# Patient Record
Sex: Female | Born: 1940 | Race: White | Hispanic: No | Smoking: Never smoker
Health system: Southern US, Community
[De-identification: ages and names within clinical notes are randomized; demographics above are authoritative.]

## PROBLEM LIST (undated history)

## (undated) DIAGNOSIS — E559 Vitamin D deficiency, unspecified: Secondary | ICD-10-CM

## (undated) DIAGNOSIS — R7309 Other abnormal glucose: Secondary | ICD-10-CM

## (undated) DIAGNOSIS — S62101A Fracture of unspecified carpal bone, right wrist, initial encounter for closed fracture: Secondary | ICD-10-CM

## (undated) DIAGNOSIS — E785 Hyperlipidemia, unspecified: Secondary | ICD-10-CM

## (undated) DIAGNOSIS — I10 Essential (primary) hypertension: Secondary | ICD-10-CM

## (undated) HISTORY — DX: Fracture of unspecified carpal bone, right wrist, initial encounter for closed fracture: S62.101A

## (undated) HISTORY — DX: Essential (primary) hypertension: I10

## (undated) HISTORY — DX: Vitamin D deficiency, unspecified: E55.9

## (undated) HISTORY — DX: Other abnormal glucose: R73.09

## (undated) HISTORY — DX: Hyperlipidemia, unspecified: E78.5

---

## 1997-01-15 LAB — HM COLONOSCOPY

## 1997-11-16 ENCOUNTER — Encounter: Payer: Self-pay | Admitting: Family Medicine

## 1997-11-16 ENCOUNTER — Ambulatory Visit (HOSPITAL_COMMUNITY): Admission: RE | Admit: 1997-11-16 | Discharge: 1997-11-16 | Payer: Self-pay | Admitting: Family Medicine

## 1998-11-28 ENCOUNTER — Other Ambulatory Visit: Admission: RE | Admit: 1998-11-28 | Discharge: 1998-11-28 | Payer: Self-pay | Admitting: Gynecology

## 1999-02-17 ENCOUNTER — Other Ambulatory Visit: Admission: RE | Admit: 1999-02-17 | Discharge: 1999-02-17 | Payer: Self-pay | Admitting: Gynecology

## 1999-04-26 ENCOUNTER — Ambulatory Visit (HOSPITAL_COMMUNITY): Admission: RE | Admit: 1999-04-26 | Discharge: 1999-04-26 | Payer: Self-pay | Admitting: Gynecology

## 1999-04-26 ENCOUNTER — Encounter: Payer: Self-pay | Admitting: Gynecology

## 1999-12-04 ENCOUNTER — Other Ambulatory Visit: Admission: RE | Admit: 1999-12-04 | Discharge: 1999-12-04 | Payer: Self-pay | Admitting: Gynecology

## 2001-01-16 ENCOUNTER — Ambulatory Visit (HOSPITAL_COMMUNITY): Admission: RE | Admit: 2001-01-16 | Discharge: 2001-01-16 | Payer: Self-pay | Admitting: Family Medicine

## 2001-01-16 ENCOUNTER — Encounter: Payer: Self-pay | Admitting: Family Medicine

## 2001-01-16 ENCOUNTER — Other Ambulatory Visit: Admission: RE | Admit: 2001-01-16 | Discharge: 2001-01-16 | Payer: Self-pay | Admitting: Gynecology

## 2003-01-01 ENCOUNTER — Other Ambulatory Visit: Admission: RE | Admit: 2003-01-01 | Discharge: 2003-01-01 | Payer: Self-pay | Admitting: Internal Medicine

## 2003-01-12 ENCOUNTER — Ambulatory Visit (HOSPITAL_COMMUNITY): Admission: RE | Admit: 2003-01-12 | Discharge: 2003-01-12 | Payer: Self-pay | Admitting: Internal Medicine

## 2004-01-13 ENCOUNTER — Ambulatory Visit (HOSPITAL_COMMUNITY): Admission: RE | Admit: 2004-01-13 | Discharge: 2004-01-13 | Payer: Self-pay | Admitting: Internal Medicine

## 2005-02-05 ENCOUNTER — Ambulatory Visit (HOSPITAL_COMMUNITY): Admission: RE | Admit: 2005-02-05 | Discharge: 2005-02-05 | Payer: Self-pay | Admitting: Internal Medicine

## 2006-02-07 ENCOUNTER — Ambulatory Visit (HOSPITAL_COMMUNITY): Admission: RE | Admit: 2006-02-07 | Discharge: 2006-02-07 | Payer: Self-pay | Admitting: Internal Medicine

## 2007-08-15 ENCOUNTER — Ambulatory Visit (HOSPITAL_COMMUNITY): Admission: RE | Admit: 2007-08-15 | Discharge: 2007-08-15 | Payer: Self-pay | Admitting: Internal Medicine

## 2008-09-03 ENCOUNTER — Ambulatory Visit (HOSPITAL_COMMUNITY): Admission: RE | Admit: 2008-09-03 | Discharge: 2008-09-03 | Payer: Self-pay | Admitting: Internal Medicine

## 2008-09-03 LAB — HM DEXA SCAN

## 2010-01-15 HISTORY — PX: EYE SURGERY: SHX253

## 2012-05-22 ENCOUNTER — Other Ambulatory Visit (HOSPITAL_COMMUNITY)
Admission: RE | Admit: 2012-05-22 | Discharge: 2012-05-22 | Disposition: A | Discharge status: Home / Self Care | Payer: Medicare Other | Source: Ambulatory Visit | Attending: Unknown Physician Specialty | Admitting: Unknown Physician Specialty

## 2012-05-22 DIAGNOSIS — Z124 Encounter for screening for malignant neoplasm of cervix: Secondary | ICD-10-CM | POA: Insufficient documentation

## 2012-06-19 ENCOUNTER — Other Ambulatory Visit (HOSPITAL_COMMUNITY): Payer: Self-pay | Admitting: Internal Medicine

## 2012-06-19 ENCOUNTER — Other Ambulatory Visit (HOSPITAL_COMMUNITY): Payer: Self-pay | Admitting: Emergency Medicine

## 2012-06-19 DIAGNOSIS — Z1231 Encounter for screening mammogram for malignant neoplasm of breast: Secondary | ICD-10-CM

## 2012-06-24 ENCOUNTER — Ambulatory Visit (HOSPITAL_COMMUNITY)
Admission: RE | Admit: 2012-06-24 | Discharge: 2012-06-24 | Disposition: A | Discharge status: Home / Self Care | Payer: Medicare Other | Source: Ambulatory Visit | Attending: Emergency Medicine | Admitting: Emergency Medicine

## 2012-06-24 DIAGNOSIS — Z1231 Encounter for screening mammogram for malignant neoplasm of breast: Secondary | ICD-10-CM

## 2012-11-05 ENCOUNTER — Encounter: Payer: Self-pay | Admitting: Internal Medicine

## 2012-11-20 ENCOUNTER — Ambulatory Visit: Payer: Self-pay | Admitting: Emergency Medicine

## 2012-11-20 ENCOUNTER — Encounter: Payer: Self-pay | Admitting: Emergency Medicine

## 2012-11-20 VITALS — BP 138/62 | HR 60 | Temp 98.2°F | Resp 16 | Ht 63.5 in | Wt 134.0 lb

## 2012-11-20 DIAGNOSIS — I10 Essential (primary) hypertension: Secondary | ICD-10-CM

## 2012-11-20 DIAGNOSIS — E782 Mixed hyperlipidemia: Secondary | ICD-10-CM | POA: Insufficient documentation

## 2012-11-20 DIAGNOSIS — E559 Vitamin D deficiency, unspecified: Secondary | ICD-10-CM

## 2012-11-20 DIAGNOSIS — R7309 Other abnormal glucose: Secondary | ICD-10-CM | POA: Insufficient documentation

## 2012-11-20 LAB — HEPATIC FUNCTION PANEL
Alkaline Phosphatase: 84 U/L (ref 39–117)
Bilirubin, Direct: 0.1 mg/dL (ref 0.0–0.3)
Indirect Bilirubin: 0.3 mg/dL (ref 0.0–0.9)

## 2012-11-20 LAB — BASIC METABOLIC PANEL WITH GFR
CO2: 26 mEq/L (ref 19–32)
Chloride: 106 mEq/L (ref 96–112)
GFR, Est African American: 49 mL/min — ABNORMAL LOW
Glucose, Bld: 90 mg/dL (ref 70–99)
Potassium: 5.6 mEq/L — ABNORMAL HIGH (ref 3.5–5.3)

## 2012-11-20 LAB — CBC WITH DIFFERENTIAL/PLATELET
Basophils Relative: 0 % (ref 0–1)
Eosinophils Absolute: 0.1 10*3/uL (ref 0.0–0.7)
MCH: 29.6 pg (ref 26.0–34.0)
MCHC: 34 g/dL (ref 30.0–36.0)
MCV: 86.9 fL (ref 78.0–100.0)
Neutro Abs: 2.9 10*3/uL (ref 1.7–7.7)
Neutrophils Relative %: 57 % (ref 43–77)
Platelets: 210 10*3/uL (ref 150–400)
WBC: 5.2 10*3/uL (ref 4.0–10.5)

## 2012-11-20 LAB — LIPID PANEL
Cholesterol: 170 mg/dL (ref 0–200)
Triglycerides: 73 mg/dL (ref <150)
VLDL: 15 mg/dL (ref 0–40)

## 2012-11-20 NOTE — Patient Instructions (Signed)
PREDiabetes Meal Planning Guide The diabetes meal planning guide is a tool to help you plan your meals and snacks. It is important for people with diabetes to manage their blood glucose (sugar) levels. Choosing the right foods and the right amounts throughout your day will help control your blood glucose. Eating right can even help you improve your blood pressure and reach or maintain a healthy weight. CARBOHYDRATE COUNTING MADE EASY When you eat carbohydrates, they turn to sugar. This raises your blood glucose level. Counting carbohydrates can help you control this level so you feel better. When you plan your meals by counting carbohydrates, you can have more flexibility in what you eat and balance your medicine with your food intake. Carbohydrate counting simply means adding up the total amount of carbohydrate grams in your meals and snacks. Try to eat about the same amount at each meal. Foods with carbohydrates are listed below. Each portion below is 1 carbohydrate serving or 15 grams of carbohydrates. Ask your dietician how many grams of carbohydrates you should eat at each meal or snack. Grains and Starches  1 slice bread.   English muffin or hotdog/hamburger bun.   cup cold cereal (unsweetened).   cup cooked pasta or rice.   cup starchy vegetables (corn, potatoes, peas, beans, winter squash).  1 tortilla (6 inches).   bagel.  1 waffle or pancake (size of a CD).   cup cooked cereal.  4 to 6 small crackers. *Whole grain is recommended. Fruit  1 cup fresh unsweetened berries, melon, papaya, pineapple.  1 small fresh fruit.   banana or mango.   cup fruit juice (4 oz unsweetened).   cup canned fruit in natural juice or water.  2 tbs dried fruit.  12 to 15 grapes or cherries. Milk and Yogurt  1 cup fat-free or 1% milk.  1 cup soy milk.  6 oz light yogurt with sugar-free sweetener.  6 oz low-fat soy yogurt.  6 oz plain yogurt. Vegetables  1 cup raw or   cup cooked is counted as 0 carbohydrates or a "free" food.  If you eat 3 or more servings at 1 meal, count them as 1 carbohydrate serving. Other Carbohydrates   oz chips or pretzels.   cup ice cream or frozen yogurt.   cup sherbet or sorbet.  2 inch square cake, no frosting.  1 tbs honey, sugar, jam, jelly, or syrup.  2 small cookies.  3 squares of graham crackers.  3 cups popcorn.  6 crackers.  1 cup broth-based soup.  Count 1 cup casserole or other mixed foods as 2 carbohydrate servings.  Foods with less than 20 calories in a serving may be counted as 0 carbohydrates or a "free" food. You may want to purchase a book or computer software that lists the carbohydrate gram counts of different foods. In addition, the nutrition facts panel on the labels of the foods you eat are a good source of this information. The label will tell you how big the serving size is and the total number of carbohydrate grams you will be eating per serving. Divide this number by 15 to obtain the number of carbohydrate servings in a portion. Remember, 1 carbohydrate serving equals 15 grams of carbohydrate. SERVING SIZES Measuring foods and serving sizes helps you make sure you are getting the right amount of food. The list below tells how big or small some common serving sizes are.  1 oz.........4 stacked dice.  3 oz........Marland KitchenDeck of cards.  1 tsp.......Marland KitchenTip  of little finger.  1 tbs......Marland KitchenMarland KitchenThumb.  2 tbs.......Marland KitchenGolf ball.   cup......Marland KitchenHalf of a fist.  1 cup.......Marland KitchenA fist. SAMPLE DIABETES MEAL PLAN Below is a sample meal plan that includes foods from the grain and starches, dairy, vegetable, fruit, and meat groups. A dietician can individualize a meal plan to fit your calorie needs and tell you the number of servings needed from each food group. However, controlling the total amount of carbohydrates in your meal or snack is more important than making sure you include all of the food groups at  every meal. You may interchange carbohydrate containing foods (dairy, starches, and fruits). The meal plan below is an example of a 2000 calorie diet using carbohydrate counting. This meal plan has 17 carbohydrate servings. Breakfast  1 cup oatmeal (2 carb servings).   cup light yogurt (1 carb serving).  1 cup blueberries (1 carb serving).   cup almonds. Snack  1 large apple (2 carb servings).  1 low-fat string cheese stick. Lunch  Chicken breast salad.  1 cup spinach.   cup chopped tomatoes.  2 oz chicken breast, sliced.  2 tbs low-fat [place] dressing.  12 whole-wheat crackers (2 carb servings).  12 to 15 grapes (1 carb serving).  1 cup low-fat milk (1 carb serving). Snack  1 cup carrots.   cup hummus (1 carb serving). Dinner  3 oz broiled salmon.  1 cup brown rice (3 carb servings). Snack  1  cups steamed broccoli (1 carb serving) drizzled with 1 tsp olive oil and lemon juice.  1 cup light pudding (2 carb servings). DIABETES MEAL PLANNING WORKSHEET Your dietician can use this worksheet to help you decide how many servings of foods and what types of foods are right for you.  BREAKFAST Food Group and Servings / Carb Servings Grain/Starches __________________________________ Dairy __________________________________________ Vegetable ______________________________________ Fruit ___________________________________________ Meat __________________________________________ Fat ____________________________________________ LUNCH Food Group and Servings / Carb Servings Grain/Starches ___________________________________ Dairy ___________________________________________ Fruit ____________________________________________ Meat ___________________________________________ Fat _____________________________________________ Monique Barton Food Group and Servings / Carb Servings Grain/Starches ___________________________________ Dairy  ___________________________________________ Fruit ____________________________________________ Meat ___________________________________________ Fat _____________________________________________ SNACKS Food Group and Servings / Carb Servings Grain/Starches ___________________________________ Dairy ___________________________________________ Vegetable _______________________________________ Fruit ____________________________________________ Meat ___________________________________________ Fat _____________________________________________ DAILY TOTALS Starches _________________________ Vegetable ________________________ Fruit ____________________________ Dairy ____________________________ Meat ____________________________ Fat ______________________________ Document Released: 09/28/2004 Document Revised: 03/26/2011 Document Reviewed: 08/09/2008 [employer] Patient Information 2014 [employer]  Hypertriglyceridemia  Diet for High blood levels of Triglycerides Most fats in food are triglycerides. Triglycerides in your blood are stored as fat in your body. High levels of triglycerides in your blood may put you at a greater risk for heart disease and stroke.  Normal triglyceride levels are less than 150 mg/dL. Borderline high levels are 150-199 mg/dl. High levels are 200 - 499 mg/dL, and very high triglyceride levels are greater than 500 mg/dL. The decision to treat high triglycerides is generally based on the level. For people with borderline or high triglyceride levels, treatment includes weight loss and exercise. Drugs are recommended for people with very high triglyceride levels. Many people who need treatment for high triglyceride levels have metabolic syndrome. This syndrome is a collection of disorders that often include: insulin resistance, high blood pressure, blood clotting problems, high cholesterol and triglycerides. TESTING PROCEDURE FOR TRIGLYCERIDES  You should not eat 4 hours  before getting your triglycerides measured. The normal range of triglycerides is between 10 and 250 milligrams per deciliter (mg/dl). Some people may have extreme levels (1000 or above), but your triglyceride level may  be too high if it is above 150 mg/dl, depending on what other risk factors you have for heart disease.  People with high blood triglycerides may also have high blood cholesterol levels. If you have high blood cholesterol as well as high blood triglycerides, your risk for heart disease is probably greater than if you only had high triglycerides. High blood cholesterol is one of the main risk factors for heart disease. CHANGING YOUR DIET  Your weight can affect your blood triglyceride level. If you are more than 20% above your ideal body weight, you may be able to lower your blood triglycerides by losing weight. Eating less and exercising regularly is the best way to combat this. Fat provides more calories than any other food. The best way to lose weight is to eat less fat. Only 30% of your total calories should come from fat. Less than 7% of your diet should come from saturated fat. A diet low in fat and saturated fat is the same as a diet to decrease blood cholesterol. By eating a diet lower in fat, you may lose weight, lower your blood cholesterol, and lower your blood triglyceride level.  Eating a diet low in fat, especially saturated fat, may also help you lower your blood triglyceride level. Ask your dietitian to help you figure how much fat you can eat based on the number of calories your caregiver has prescribed for you.  Exercise, in addition to helping with weight loss may also help lower triglyceride levels.   Alcohol can increase blood triglycerides. You may need to stop drinking alcoholic beverages.  Too much carbohydrate in your diet may also increase your blood triglycerides. Some complex carbohydrates are necessary in your diet. These may include bread, rice, potatoes, other  starchy vegetables and cereals.  Reduce "simple" carbohydrates. These may include pure sugars, candy, honey, and jelly without losing other nutrients. If you have the kind of high blood triglycerides that is affected by the amount of carbohydrates in your diet, you will need to eat less sugar and less high-sugar foods. Your caregiver can help you with this.  Adding 2-4 grams of fish oil (EPA+ DHA) may also help lower triglycerides. Speak with your caregiver before adding any supplements to your regimen. Following the Diet  Maintain your ideal weight. Your caregivers can help you with a diet. Generally, eating less food and getting more exercise will help you lose weight. Joining a weight control group may also help. Ask your caregivers for a good weight control group in your area.  Eat low-fat foods instead of high-fat foods. This can help you lose weight too.  These foods are lower in fat. Eat MORE of these:   Dried beans, peas, and lentils.  Egg whites.  Low-fat cottage cheese.  Fish.  Lean cuts of meat, such as round, sirloin, rump, and flank (cut extra fat off meat you fix).  Whole grain breads, cereals and pasta.  Skim and nonfat dry milk.  Low-fat yogurt.  Poultry without the skin.  Cheese made with skim or part-skim milk, such as mozzarella, parmesan, farmers', ricotta, or pot cheese. These are higher fat foods. Eat LESS of these:   Whole milk and foods made from whole milk, such as American, blue, cheddar, monterey jack, and swiss cheese  High-fat meats, such as luncheon meats, sausages, knockwurst, bratwurst, hot dogs, ribs, corned beef, ground pork, and regular ground beef.  Fried foods. Limit saturated fats in your diet. Substituting unsaturated fat for saturated fat may  decrease your blood triglyceride level. You will need to read package labels to know which products contain saturated fats.  These foods are high in saturated fat. Eat LESS of these:   Fried pork  skins.  Whole milk.  Skin and fat from poultry.  Palm oil.  Butter.  Shortening.  Cream cheese.  Monique Barton.  Margarines and baked goods made from listed oils.  Vegetable shortenings.  Chitterlings.  Fat from meats.  Coconut oil.  Palm kernel oil.  Lard.  Cream.  Sour cream.  Fatback.  Coffee whiteners and non-dairy creamers made with these oils.  Cheese made from whole milk. Use unsaturated fats (both polyunsaturated and monounsaturated) moderately. Remember, even though unsaturated fats are better than saturated fats; you still want a diet low in total fat.  These foods are high in unsaturated fat:   Canola oil.  Sunflower oil.  Mayonnaise.  Almonds.  Peanuts.  Pine nuts.  Margarines made with these oils.  Safflower oil.  Olive oil.  Avocados.  Cashews.  Peanut butter.  Sunflower seeds.  Soybean oil.  Peanut oil.  Olives.  Pecans.  Walnuts.  Pumpkin seeds. Avoid sugar and other high-sugar foods. This will decrease carbohydrates without decreasing other nutrients. Sugar in your food goes rapidly to your blood. When there is excess sugar in your blood, your liver may use it to make more triglycerides. Sugar also contains calories without other important nutrients.  Eat LESS of these:   Sugar, brown sugar, powdered sugar, jam, jelly, preserves, honey, syrup, molasses, pies, candy, cakes, cookies, frosting, pastries, colas, soft drinks, punches, fruit drinks, and regular gelatin.  Avoid alcohol. Alcohol, even more than sugar, may increase blood triglycerides. In addition, alcohol is high in calories and low in nutrients. Ask for sparkling water, or a diet soft drink instead of an alcoholic beverage. Suggestions for planning and preparing meals   Bake, broil, grill or roast meats instead of frying.  Remove fat from meats and skin from poultry before cooking.  Add spices, herbs, lemon juice or vinegar to vegetables instead of salt,  rich sauces or gravies.  Use a non-stick skillet without fat or use no-stick sprays.  Cool and refrigerate stews and broth. Then remove the hardened fat floating on the surface before serving.  Refrigerate meat drippings and skim off fat to make low-fat gravies.  Serve more fish.  Use less butter, margarine and other high-fat spreads on bread or vegetables.  Use skim or reconstituted non-fat dry milk for cooking.  Cook with low-fat cheeses.  Substitute low-fat yogurt or cottage cheese for all or part of the sour cream in recipes for sauces, dips or congealed salads.  Use half yogurt/half mayonnaise in salad recipes.  Substitute evaporated skim milk for cream. Evaporated skim milk or reconstituted non-fat dry milk can be whipped and substituted for whipped cream in certain recipes.  Choose fresh fruits for dessert instead of high-fat foods such as pies or cakes. Fruits are naturally low in fat. When Dining Out   Order low-fat appetizers such as fruit or vegetable juice, pasta with vegetables or tomato sauce.  Select clear, rather than cream soups.  Ask that dressings and gravies be served on the side. Then use less of them.  Order foods that are baked, broiled, poached, steamed, stir-fried, or roasted.  Ask for margarine instead of butter, and use only a small amount.  Drink sparkling water, unsweetened tea or coffee, or diet soft drinks instead of alcohol or other sweet beverages. QUESTIONS AND  ANSWERS ABOUT OTHER FATS IN THE BLOOD: SATURATED FAT, TRANS FAT, AND CHOLESTEROL What is trans fat? Trans fat is a type of fat that is formed when vegetable oil is hardened through a process called hydrogenation. This process helps makes foods more solid, gives them shape, and prolongs their shelf life. Trans fats are also called hydrogenated or partially hydrogenated oils.  What do saturated fat, trans fat, and cholesterol in foods have to do with heart disease? Saturated fat, trans  fat, and cholesterol in the diet all raise the level of LDL "bad" cholesterol in the blood. The higher the LDL cholesterol, the greater the risk for coronary heart disease (CHD). Saturated fat and trans fat raise LDL similarly.  What foods contain saturated fat, trans fat, and cholesterol? High amounts of saturated fat are found in animal products, such as fatty cuts of meat, chicken skin, and full-fat dairy products like butter, whole milk, cream, and cheese, and in tropical vegetable oils such as palm, palm kernel, and coconut oil. Trans fat is found in some of the same foods as saturated fat, such as vegetable shortening, some margarines (especially hard or stick margarine), crackers, cookies, baked goods, fried foods, salad dressings, and other processed foods made with partially hydrogenated vegetable oils. Small amounts of trans fat also occur naturally in some animal products, such as milk products, beef, and lamb. Foods high in cholesterol include liver, other organ meats, egg yolks, shrimp, and full-fat dairy products. How can I use the new food label to make heart-healthy food choices? Check the Nutrition Facts panel of the food label. Choose foods lower in saturated fat, trans fat, and cholesterol. For saturated fat and cholesterol, you can also use the Percent Daily Value (%DV): 5% DV or less is low, and 20% DV or more is high. (There is no %DV for trans fat.) Use the Nutrition Facts panel to choose foods low in saturated fat and cholesterol, and if the trans fat is not listed, read the ingredients and limit products that list shortening or hydrogenated or partially hydrogenated vegetable oil, which tend to be high in trans fat. POINTS TO REMEMBER:   Discuss your risk for heart disease with your caregivers, and take steps to reduce risk factors.  Change your diet. Choose foods that are low in saturated fat, trans fat, and cholesterol.  Add exercise to your daily routine if it is not already  being done. Participate in physical activity of moderate intensity, like brisk walking, for at least 30 minutes on most, and preferably all days of the week. No time? Break the 30 minutes into three, 10-minute segments during the day.  Stop smoking. If you do smoke, contact your caregiver to discuss ways in which they can help you quit.  Do not use street drugs.  Maintain a normal weight.  Maintain a healthy blood pressure.  Keep up with your blood work for checking the fats in your blood as directed by your caregiver. Document Released: 10/20/2003 Document Revised: 07/03/2011 Document Reviewed: 05/17/2008 [facility] Patient Information 2014 [place]. Hypertension Hypertension is another name for high blood pressure. High blood pressure may mean that your heart needs to work harder to pump blood. Blood pressure consists of two numbers, which includes a higher number over a lower number (example: 110/72). HOME CARE   Make lifestyle changes as told by your doctor. This may include weight loss and exercise.  Take your blood pressure medicine every day.  Limit how much salt you use.  Stop  smoking if you smoke.  Do not use drugs.  Talk to your doctor if you are using decongestants or birth control pills. These medicines might make blood pressure higher.  Females should not drink more than 1 alcoholic drink per day. Males should not drink more than 2 alcoholic drinks per day.  See your doctor as told. GET HELP RIGHT AWAY IF:   You have a blood pressure reading with a top number of 180 or higher.  You get a very bad headache.  You get blurred or changing vision.  You feel confused.  You feel weak, numb, or faint.  You get chest or belly (abdominal) pain.  You throw up (vomit).  You cannot breathe very well. MAKE SURE YOU:   Understand these instructions.  Will watch your condition.  Will get help right away if you are not doing well or get worse. Document  Released: 06/20/2007 Document Revised: 03/26/2011 Document Reviewed: 06/20/2007 [facility] Patient Information 2014 [place], [place].

## 2012-11-21 ENCOUNTER — Encounter: Payer: Self-pay | Admitting: Emergency Medicine

## 2012-11-21 ENCOUNTER — Telehealth: Payer: Self-pay | Admitting: *Deleted

## 2012-11-21 NOTE — Telephone Encounter (Signed)
Message copied by Bradly Bienenstock A on Fri Nov 21, 2012  3:05 PM ------      Message from: [place], [place] R      Created: Fri Nov 21, 2012  2:13 PM       Increase H2O, cholesterol better but A1c mild elevation continue better diet, exercise, wt loss. OV 02-19-13 with AC ------

## 2012-11-21 NOTE — Progress Notes (Signed)
  Subjective:    Patient ID: Monique Barton, female    DOB: 1940/01/28, 72 y.o.   MRN: [id number]  HPI Comments: 72 yo female presents for 3 month F/U for HTN, Cholesterol, Pre-Dm, D. Deficient. Her BP has been 120-30/80s at home. She has been exercising with yard work. She has been eating fairly healthy. She increased cholesterol RX to whole tab AD. No other concerns or symptoms. Last labs T 192 TG 80 HDL 64 LDL 112 A1C 5.6 INSULIN 9 D 86   Hypertension  Hyperlipidemia     Current Outpatient Prescriptions on File Prior to Visit  Medication Sig Dispense Refill  . aspirin 81 MG tablet Take 81 mg by mouth daily.      . cholecalciferol (VITAMIN D) 1000 UNITS tablet Take 1,000 Units by mouth daily. TAKES 5000 IU QD      . olmesartan (BENICAR) 40 MG tablet Take 40 mg by mouth daily. TAKES 1/2 QD      . pravastatin (PRAVACHOL) 40 MG tablet Take 40 mg by mouth daily.        No current facility-administered medications on file prior to visit.  ALLERGIES Prednisone Past Medical History  Diagnosis Date  . Hypertension   . Hyperlipidemia   . Unspecified vitamin D deficiency   . Other abnormal glucose   . Wrist fracture, right     Review of Systems  All other systems reviewed and are negative.   BP 138/62  Pulse 60  Temp(Src) 98.2 F (36.8 C) (Temporal)  Resp 16  Ht 5' 3.5" (1.613 m)  Wt 134 lb (60.782 kg)  BMI 23.36 kg/m2     Objective:   Physical Exam  Nursing note and vitals reviewed. Constitutional: She is oriented to person, place, and time. She appears well-developed and well-nourished.  HENT:  Head: Normocephalic and atraumatic.  Eyes: Pupils are equal, round, and reactive to light.  Neck: Normal range of motion. Neck supple.  Cardiovascular: Normal rate, regular rhythm, normal heart sounds and intact distal pulses.   Pulmonary/Chest: Effort normal and breath sounds normal.  Abdominal: Soft. Bowel sounds are normal.  Musculoskeletal: Normal range of motion.   Neurological: She is alert and oriented to person, place, and time.  Skin: Skin is warm and dry.  Psychiatric: She has a normal mood and affect. Judgment and thought content normal.          Assessment & Plan:  3 MONTH F/U HTN, Cholesterol, preDM, D-def. Check labs continue to improve diet and cardio. SX Benicar 20 mg AD #28

## 2012-11-24 NOTE — Telephone Encounter (Signed)
Spoke with pt about lab results. F/u ov sched 02/19/13 with Vicie Mutters, PA-C.

## 2013-01-13 ENCOUNTER — Other Ambulatory Visit: Payer: Self-pay | Admitting: Internal Medicine

## 2013-01-13 MED ORDER — OLMESARTAN MEDOXOMIL 40 MG PO TABS: 40.0000 mg | ORAL_TABLET | Freq: Every day | ORAL | Status: DC

## 2013-02-19 ENCOUNTER — Ambulatory Visit (INDEPENDENT_AMBULATORY_CARE_PROVIDER_SITE_OTHER): Payer: Medicare Other | Admitting: Physician Assistant

## 2013-02-19 ENCOUNTER — Encounter: Payer: Self-pay | Admitting: Physician Assistant

## 2013-02-19 VITALS — BP 110/68 | HR 60 | Temp 98.1°F | Resp 16 | Ht 63.0 in | Wt 133.0 lb

## 2013-02-19 DIAGNOSIS — Z79899 Other long term (current) drug therapy: Secondary | ICD-10-CM

## 2013-02-19 DIAGNOSIS — R7309 Other abnormal glucose: Secondary | ICD-10-CM

## 2013-02-19 DIAGNOSIS — E559 Vitamin D deficiency, unspecified: Secondary | ICD-10-CM

## 2013-02-19 DIAGNOSIS — E782 Mixed hyperlipidemia: Secondary | ICD-10-CM

## 2013-02-19 DIAGNOSIS — I10 Essential (primary) hypertension: Secondary | ICD-10-CM

## 2013-02-19 DIAGNOSIS — IMO0001 Reserved for inherently not codable concepts without codable children: Secondary | ICD-10-CM

## 2013-02-19 LAB — BASIC METABOLIC PANEL WITH GFR
BUN: 22 mg/dL (ref 6–23)
CALCIUM: 10.3 mg/dL (ref 8.4–10.5)
CO2: 31 meq/L (ref 19–32)
CREATININE: 1.58 mg/dL — AB (ref 0.50–1.10)
Chloride: 103 mEq/L (ref 96–112)
GFR, Est African American: 37 mL/min — ABNORMAL LOW
GFR, Est Non African American: 32 mL/min — ABNORMAL LOW
GLUCOSE: 86 mg/dL (ref 70–99)
Potassium: 4.9 mEq/L (ref 3.5–5.3)
SODIUM: 145 meq/L (ref 135–145)

## 2013-02-19 LAB — CBC WITH DIFFERENTIAL/PLATELET
Basophils Absolute: 0 10*3/uL (ref 0.0–0.1)
Basophils Relative: 0 % (ref 0–1)
EOS ABS: 0.2 10*3/uL (ref 0.0–0.7)
Eosinophils Relative: 3 % (ref 0–5)
HCT: 37.9 % (ref 36.0–46.0)
HEMOGLOBIN: 12.5 g/dL (ref 12.0–15.0)
LYMPHS ABS: 1.9 10*3/uL (ref 0.7–4.0)
LYMPHS PCT: 40 % (ref 12–46)
MCH: 29.3 pg (ref 26.0–34.0)
MCHC: 33 g/dL (ref 30.0–36.0)
MCV: 88.8 fL (ref 78.0–100.0)
MONO ABS: 0.4 10*3/uL (ref 0.1–1.0)
Monocytes Relative: 8 % (ref 3–12)
NEUTROS ABS: 2.3 10*3/uL (ref 1.7–7.7)
Neutrophils Relative %: 49 % (ref 43–77)
Platelets: 225 10*3/uL (ref 150–400)
RBC: 4.27 MIL/uL (ref 3.87–5.11)
RDW: 14.8 % (ref 11.5–15.5)
WBC: 4.8 10*3/uL (ref 4.0–10.5)

## 2013-02-19 LAB — CK: Total CK: 115 U/L (ref 7–177)

## 2013-02-19 LAB — HEPATIC FUNCTION PANEL
ALK PHOS: 76 U/L (ref 39–117)
ALT: 16 U/L (ref 0–35)
AST: 24 U/L (ref 0–37)
Albumin: 4.5 g/dL (ref 3.5–5.2)
BILIRUBIN TOTAL: 0.5 mg/dL (ref 0.2–1.2)
Bilirubin, Direct: 0.1 mg/dL (ref 0.0–0.3)
Indirect Bilirubin: 0.4 mg/dL (ref 0.2–1.2)
TOTAL PROTEIN: 7.3 g/dL (ref 6.0–8.3)

## 2013-02-19 LAB — LIPID PANEL
CHOLESTEROL: 190 mg/dL (ref 0–200)
HDL: 74 mg/dL (ref >39)
LDL Cholesterol: 104 mg/dL — ABNORMAL HIGH (ref 0–99)
TRIGLYCERIDES: 62 mg/dL (ref <150)
Total CHOL/HDL Ratio: 2.6 Ratio
VLDL: 12 mg/dL (ref 0–40)

## 2013-02-19 LAB — MAGNESIUM: Magnesium: 2.1 mg/dL (ref 1.5–2.5)

## 2013-02-19 LAB — TSH: TSH: 0.987 u[IU]/mL (ref 0.350–4.500)

## 2013-02-19 NOTE — Patient Instructions (Signed)
 Bad carbs also include fruit juice, alcohol, and sweet tea. These are empty calories that do not signal to your brain that you are full.   Please remember the good carbs are still carbs which convert into sugar. So please measure them out no more than 1/2-1 cup of rice, oatmeal, pasta, and beans.  Veggies are however free foods! Pile them on.   I like lean protein at every meal such as chicken, turkey, pork chops, cottage cheese, etc. Just do not fry these meats and please center your meal around vegetable, the meats should be a side dish.   No all fruit is created equal. Please see the list below, the fruit at the bottom is higher in sugars than the fruit at the top   Cholesterol Cholesterol is a white, waxy, fat-like protein needed by your body in small amounts. The liver makes all the cholesterol you need. It is carried from the liver by the blood through the blood vessels. Deposits (plaque) may build up on blood vessel walls. This makes the arteries narrower and stiffer. Plaque increases the risk for heart attack and stroke. You cannot feel your cholesterol level even if it is very high. The only way to know is by a blood test to check your lipid (fats) levels. Once you know your cholesterol levels, you should keep a record of the test results. Work with your caregiver to to keep your levels in the desired range. WHAT THE RESULTS MEAN:  Total cholesterol is a rough measure of all the cholesterol in your blood.  LDL is the so-called bad cholesterol. This is the type that deposits cholesterol in the walls of the arteries. You want this level to be low.  HDL is the good cholesterol because it cleans the arteries and carries the LDL away. You want this level to be high.  Triglycerides are fat that the body can either burn for energy or store. High levels are closely linked to heart disease. DESIRED LEVELS:  Total cholesterol below 200.  LDL below 100 for people at risk, below 70 for very  high risk.  HDL above 50 is good, above 60 is best.  Triglycerides below 150. HOW TO LOWER YOUR CHOLESTEROL:  Diet.  Choose fish or white meat chicken and turkey, roasted or baked. Limit fatty cuts of red meat, fried foods, and processed meats, such as sausage and lunch meat.  Eat lots of fresh fruits and vegetables. Choose whole grains, beans, pasta, potatoes and cereals.  Use only small amounts of olive, corn or canola oils. Avoid butter, mayonnaise, shortening or palm kernel oils. Avoid foods with trans-fats.  Use skim/nonfat milk and low-fat/nonfat yogurt and cheeses. Avoid whole milk, cream, ice cream, egg yolks and cheeses. Healthy desserts include angel food cake, ginger snaps, animal crackers, hard candy, popsicles, and low-fat/nonfat frozen yogurt. Avoid pastries, cakes, pies and cookies.  Exercise.  A regular program helps decrease LDL and raises HDL.  Helps with weight control.  Do things that increase your activity level like gardening, walking, or taking the stairs.  Medication.  May be prescribed by your caregiver to help lowering cholesterol and the risk for heart disease.  You may need medicine even if your levels are normal if you have several risk factors. HOME CARE INSTRUCTIONS   Follow your diet and exercise programs as suggested by your caregiver.  Take medications as directed.  Have blood work done when your caregiver feels it is necessary. MAKE SURE YOU:   Understand   these instructions.  Will watch your condition.  Will get help right away if you are not doing well or get worse. Document Released: 09/26/2000 Document Revised: 03/26/2011 Document Reviewed: 10/15/2012 [employer] Patient Information 2014 [employer].  

## 2013-02-19 NOTE — Progress Notes (Signed)
HPI 73 y.o. female  presents for 3 month follow up with hypertension, hyperlipidemia, prediabetes and vitamin D. Her blood pressure has been controlled at home, today their BP is BP: 110/68 mmHg She denies chest pain, shortness of breath, dizziness. She walks on her treadmill 2-3 days a week.  Her cholesterol is diet controlled. In addition they are on pravastatin and denies diffuse myalgias but complains of bilateral hand pain and occasionally when she stands all day her legs hurt.  Her cholesterol is controlled. The cholesterol last visit was:   Lab Results  Component Value Date   CHOL 170 11/20/2012   HDL 58 11/20/2012   LDLCALC 97 11/20/2012   TRIG 73 11/20/2012   CHOLHDL 2.9 11/20/2012   She has been working on diet and exercise for prediabetes, and denies blurry vision, polydipsia, polyphagia and polyuria. Last A1C in the office was:  Lab Results  Component Value Date   HGBA1C 5.7* 11/20/2012   Patient is on Vitamin D supplement.    Current Medications:  Current Outpatient Prescriptions on File Prior to Visit  Medication Sig Dispense Refill  . aspirin 81 MG tablet Take 81 mg by mouth daily.      . cholecalciferol (VITAMIN D) 1000 UNITS tablet Take 1,000 Units by mouth daily. TAKES 5000 IU QD      . olmesartan (BENICAR) 40 MG tablet Take 1 tablet (40 mg total) by mouth daily. TAKES 1/2 QD  30 tablet  2  . pravastatin (PRAVACHOL) 40 MG tablet Take 40 mg by mouth daily.        No current facility-administered medications on file prior to visit.   Medical History:  Past Medical History  Diagnosis Date  . Hypertension   . Hyperlipidemia   . Unspecified vitamin D deficiency   . Other abnormal glucose   . Wrist fracture, right    Allergies:  Allergies  Allergen Reactions  . Prednisone Other (See Comments)    HIGH DOSE CAUSES INSOMNIA    ROS Constitutional: Denies fever, chills, headaches, insomnia, fatigue, night sweats Eyes: Denies redness, blurred vision, diplopia,  discharge, itchy, watery eyes.  ENT: Denies congestion, post nasal drip, sore throat, earache, dental pain, Tinnitus, Vertigo, Sinus pain, snoring.  Cardio: Denies chest pain, palpitations, irregular heartbeat, dyspnea, diaphoresis, orthopnea, PND, claudication, edema Respiratory: denies cough, shortness of breath, wheezing.  Gastrointestinal: Denies dysphagia, heartburn, AB pain/ cramps, N/V, diarrhea, constipation, hematemesis, melena, hematochezia,  hemorrhoids Genitourinary: Denies dysuria, frequency, urgency, nocturia, hesitancy, discharge, hematuria, flank pain Musculoskeletal: + hand and leg myalgia Denies stiffness, pain, swelling and strain/sprain. Skin: Denies pruritis, rash, changing in skin lesion Neuro: Denies Weakness, tremor, incoordination, spasms, pain Psychiatric: Denies confusion, memory loss, sensory loss Endocrine: Denies change in weight, skin, hair change, nocturia Diabetic Polys, Denies visual blurring, hyper /hypo glycemic episodes, and paresthesia, Heme/Lymph: Denies Excessive bleeding, bruising, enlarged lymph nodes  Family history- Review and unchanged Social history- Review and unchanged Physical Exam: Filed Vitals:   02/19/13 0929  BP: 110/68  Pulse: 60  Temp: 98.1 F (36.7 C)  Resp: 16   Filed Weights   02/19/13 0929  Weight: 133 lb (60.328 kg)   General Appearance: Well nourished, in no apparent distress. Eyes: PERRLA, EOMs, conjunctiva no swelling or erythema Sinuses: No Frontal/maxillary tenderness ENT/Mouth: Ext aud canals clear, TMs without erythema, bulging. No erythema, swelling, or exudate on post pharynx.  Tonsils not swollen or erythematous. Hearing normal.  Neck: Supple, thyroid normal.  Respiratory: Respiratory effort normal, BS equal bilaterally without  rales, rhonchi, wheezing or stridor.  Cardio: RRR with no MRGs. Brisk peripheral pulses without edema.  Abdomen: Soft, + BS.  Non tender, no guarding, rebound, hernias,  masses. Lymphatics: Non tender without lymphadenopathy.  Musculoskeletal: Full ROM, 5/5 strength, normal gait.  Skin: Warm, dry without rashes, lesions, ecchymosis.  Neuro: Cranial nerves intact. Normal muscle tone, no cerebellar symptoms. Sensation intact.  Psych: Awake and oriented X 3, normal affect, Insight and Judgment appropriate.   Assessment and Plan:  Hypertension: Continue medication, monitor blood pressure at home.  Continue DASH diet. Cholesterol: Continue diet and exercise. Check cholesterol.  ?Myalgia versus OA- check CPK- discussed switching to Lipitor 3 days a week.  Pre-diabetes-Continue diet and exercise. Check A1C Vitamin D Def- check level and continue medications.   Continue diet and meds as discussed. Further disposition pending results of labs.  Vicie Mutters 10:00 AM

## 2013-02-20 LAB — HEMOGLOBIN A1C
Hgb A1c MFr Bld: 5.9 % — ABNORMAL HIGH
Mean Plasma Glucose: 123 mg/dL — ABNORMAL HIGH

## 2013-02-20 LAB — INSULIN, FASTING: Insulin fasting, serum: 3 u[IU]/mL (ref 3–28)

## 2013-02-20 LAB — VITAMIN D 25 HYDROXY (VIT D DEFICIENCY, FRACTURES): Vit D, 25-Hydroxy: 68 ng/mL (ref 30–89)

## 2013-02-27 ENCOUNTER — Other Ambulatory Visit: Payer: Self-pay | Admitting: *Deleted

## 2013-02-27 MED ORDER — PRAVASTATIN SODIUM 40 MG PO TABS: 40.0000 mg | ORAL_TABLET | Freq: Every day | ORAL | Status: DC

## 2013-06-03 ENCOUNTER — Ambulatory Visit (INDEPENDENT_AMBULATORY_CARE_PROVIDER_SITE_OTHER): Payer: Medicare HMO | Admitting: Emergency Medicine

## 2013-06-03 ENCOUNTER — Encounter: Payer: Self-pay | Admitting: Emergency Medicine

## 2013-06-03 VITALS — BP 140/82 | HR 62 | Temp 98.0°F | Resp 16 | Ht 63.25 in | Wt 130.0 lb

## 2013-06-03 DIAGNOSIS — Z1331 Encounter for screening for depression: Secondary | ICD-10-CM

## 2013-06-03 DIAGNOSIS — E559 Vitamin D deficiency, unspecified: Secondary | ICD-10-CM

## 2013-06-03 DIAGNOSIS — R5381 Other malaise: Secondary | ICD-10-CM

## 2013-06-03 DIAGNOSIS — M8580 Other specified disorders of bone density and structure, unspecified site: Secondary | ICD-10-CM

## 2013-06-03 DIAGNOSIS — Z789 Other specified health status: Secondary | ICD-10-CM

## 2013-06-03 DIAGNOSIS — E782 Mixed hyperlipidemia: Secondary | ICD-10-CM

## 2013-06-03 DIAGNOSIS — I10 Essential (primary) hypertension: Secondary | ICD-10-CM

## 2013-06-03 DIAGNOSIS — R7309 Other abnormal glucose: Secondary | ICD-10-CM

## 2013-06-03 DIAGNOSIS — Z0000 Encounter for general adult medical examination without abnormal findings: Secondary | ICD-10-CM

## 2013-06-03 DIAGNOSIS — R5383 Other fatigue: Secondary | ICD-10-CM

## 2013-06-03 DIAGNOSIS — Z1212 Encounter for screening for malignant neoplasm of rectum: Secondary | ICD-10-CM

## 2013-06-03 LAB — CBC WITH DIFFERENTIAL/PLATELET
BASOS PCT: 1 % (ref 0–1)
Basophils Absolute: 0.1 10*3/uL (ref 0.0–0.1)
EOS PCT: 2 % (ref 0–5)
Eosinophils Absolute: 0.1 10*3/uL (ref 0.0–0.7)
HEMATOCRIT: 37.2 % (ref 36.0–46.0)
Hemoglobin: 12.2 g/dL (ref 12.0–15.0)
LYMPHS PCT: 30 % (ref 12–46)
Lymphs Abs: 1.8 10*3/uL (ref 0.7–4.0)
MCH: 28.7 pg (ref 26.0–34.0)
MCHC: 32.8 g/dL (ref 30.0–36.0)
MCV: 87.5 fL (ref 78.0–100.0)
MONO ABS: 0.4 10*3/uL (ref 0.1–1.0)
Monocytes Relative: 7 % (ref 3–12)
Neutro Abs: 3.5 10*3/uL (ref 1.7–7.7)
Neutrophils Relative %: 60 % (ref 43–77)
Platelets: 214 10*3/uL (ref 150–400)
RBC: 4.25 MIL/uL (ref 3.87–5.11)
RDW: 14.2 % (ref 11.5–15.5)
WBC: 5.9 10*3/uL (ref 4.0–10.5)

## 2013-06-03 LAB — URINALYSIS, ROUTINE W REFLEX MICROSCOPIC
BILIRUBIN URINE: NEGATIVE
Glucose, UA: NEGATIVE mg/dL
Hgb urine dipstick: NEGATIVE
KETONES UR: NEGATIVE mg/dL
Leukocytes, UA: NEGATIVE
Nitrite: NEGATIVE
Protein, ur: NEGATIVE mg/dL
SPECIFIC GRAVITY, URINE: 1.008 (ref 1.005–1.030)
Urobilinogen, UA: 0.2 mg/dL (ref 0.0–1.0)
pH: 6.5 (ref 5.0–8.0)

## 2013-06-03 LAB — HEMOGLOBIN A1C
Hgb A1c MFr Bld: 6 % — ABNORMAL HIGH (ref <5.7)
Mean Plasma Glucose: 126 mg/dL — ABNORMAL HIGH (ref <117)

## 2013-06-03 NOTE — Patient Instructions (Signed)

## 2013-06-03 NOTE — Progress Notes (Signed)
Patient ID: Monique Barton, female   DOB: 1940/08/04, 73 y.o.   MRN: [id number] Subjective:   Monique Barton is a 73 y.o. female who presents for Medicare Annual Wellness Visit and complete physical.    Date of last medicare wellness visit is unknown.  Her blood pressure has been controlled at home, today their BP is BP: 140/82 mmHg She does workout. She denies chest pain, shortness of breath, dizziness.  She is on cholesterol medication and denies myalgias. Her cholesterol is not at goal. The cholesterol last visit was:   Lab Results  Component Value Date   CHOL 190 02/19/2013   HDL 74 02/19/2013   LDLCALC 104* 02/19/2013   TRIG 62 02/19/2013   CHOLHDL 2.6 02/19/2013   She has been working on diet and exercise for prediabetes, and denies paresthesia of the feet, polyuria and visual disturbances. Last A1C in the office was:  Lab Results  Component Value Date   HGBA1C 5.9* 02/19/2013   Patient is on Vitamin D supplement.     Names of Other Physician/Practitioners you currently use: Patient Care Team: Unk Pinto, MD as PCP - General (Internal Medicine) Arnoldo Morale, (EYE) NO DENTIST with dentures   Medication Review Current Outpatient Prescriptions on File Prior to Visit  Medication Sig Dispense Refill  . aspirin 81 MG tablet Take 81 mg by mouth daily.      . cholecalciferol (VITAMIN D) 1000 UNITS tablet Take 1,000 Units by mouth daily. TAKES 5000 IU QD      . olmesartan (BENICAR) 40 MG tablet Take 1 tablet (40 mg total) by mouth daily. TAKES 1/2 QD  30 tablet  2  . pravastatin (PRAVACHOL) 40 MG tablet Take 1 tablet (40 mg total) by mouth daily.  90 tablet  1   No current facility-administered medications on file prior to visit.   Allergies  Allergen Reactions  . Prednisone Other (See Comments)    HIGH DOSE CAUSES INSOMNIA     Current Problems (verified) Patient Active Problem List   Diagnosis Date Noted  . Unspecified essential hypertension 11/20/2012  . Mixed hyperlipidemia  11/20/2012  . Other abnormal glucose 11/20/2012  . Unspecified vitamin D deficiency 11/20/2012    Screening Tests Health Maintenance  Topic Date Due  . Colonoscopy  01/16/2007  . Influenza Vaccine  08/15/2013  . Mammogram  06/25/2014  . Tetanus/tdap  04/03/2021  . Pneumococcal Polysaccharide Vaccine Age 3 And Over  Completed  . Zostavax  Completed    Immunization History  Administered Date(s) Administered  . Influenza Nasal 11/06/2011  . Influenza-Unspecified 10/24/2012  . Pneumococcal-Unspecified 02/07/2006  . Tdap 04/04/2011  . Zoster 01/15/2009    Preventative care: Last colonoscopy: 1999 Declines future scopes Last mammogram: 06/24/12 Last pap smear/pelvic exam: 05/2012 DEXA:08/2008 borderline osteopenia  Prior vaccinations: TD or Tdap: 2013  Influenza:  Pneumococcal: 2008 Shingles/Zostavax: 2011  History reviewed:  Past Medical History  Diagnosis Date  . Hypertension   . Hyperlipidemia   . Unspecified vitamin D deficiency   . Other abnormal glucose   . Wrist fracture, right    Past Surgical History  Procedure Laterality Date  . Eye surgery Bilateral 2012    History  Substance Use Topics  . Smoking status: Passive Smoke Exposure - Never Smoker  . Smokeless tobacco: Not on file     Comment: Second hand smoke exposure  . Alcohol Use: No   Family History  Problem Relation Age of Onset  . Diabetes Mother   . Heart disease  Mother   . Hyperlipidemia Mother   . Hypertension Mother   . Cancer Father     PROSTATE  . Diabetes Brother   . Diabetes Brother   . Cancer Brother     LUNG  . Cancer Brother     BONE     Risk Factors: Osteoporosis: postmenopausal estrogen deficiency History of fracture in the past year: no  Tobacco History  Substance Use Topics  . Smoking status: Passive Smoke Exposure - Never Smoker  . Smokeless tobacco: Not on file     Comment: Second hand smoke exposure  . Alcohol Use: No   She does not smoke.  Patient is not a  former smoker. Are there smokers in your home (other than you)?  No  Alcohol Current alcohol use: none  Caffeine Current caffeine use: coffee 3 /day and caffeinated soft drinks 1 /day  Exercise Current exercise habits: Home exercise routine includes walking 1 hrs per days.  Current exercise: yard work  Nutrition/Diet Current diet: in general, a "healthy" diet    Cardiac risk factors: hypertension.  Depression Screen (Note: if answer to either of the following is "Yes", a more complete depression screening is indicated)   Q1: Over the past two weeks, have you felt down, depressed or hopeless? No  Q2: Over the past two weeks, have you felt little interest or pleasure in doing things? No  Have you lost interest or pleasure in daily life? No  Do you often feel hopeless? No  Do you cry easily over simple problems? No  Activities of Daily Living In your present state of health, do you have any difficulty performing the following activities?:  Driving? No Managing money?  No Feeding yourself? No Getting from bed to chair? No Climbing a flight of stairs? No Preparing food and eating?: No Bathing or showering? No Getting dressed: No Getting to the toilet? No Using the toilet:No Moving around from place to place: No In the past year have you fallen or had a near fall?:No   Are you sexually active?  No  Do you have more than one partner?  No  Vision Difficulties: No  Hearing Difficulties: No Do you often ask people to speak up or repeat themselves? No Do you experience ringing or noises in your ears? No Do you have difficulty understanding soft or whispered voices? No  Cognition  Do you feel that you have a problem with memory?No  Do you often misplace items? No  Do you feel safe at home?  No  Advanced directives Does patient have a Health Care Power of Attorney? No Does patient have a Living Will? No   Objective:     Blood pressure 140/82, pulse 62, temperature  98 F (36.7 C), temperature source Temporal, resp. rate 16, height 5' 3.25" (1.607 m), weight 130 lb (58.968 kg). Body mass index is 22.83 kg/(m^2).  General appearance: alert, no distress, WD/WN,  female Cognitive Testing  Alert? Yes  Normal Appearance?Yes  Oriented to person? Yes  Place? Yes   Time? Yes  Recall of three objects?  Yes  Can perform simple calculations? Yes  Displays appropriate judgment?Yes  Can read the correct time from a watch face?Yes  HEENT: normocephalic, sclerae anicteric, TMs pearly, nares patent, no discharge or erythema, pharynx normal Oral cavity: MMM, no lesions Neck: supple, no lymphadenopathy, no thyromegaly, no masses Heart: RRR, normal S1, S2, no murmurs Lungs: CTA bilaterally, no wheezes, rhonchi, or rales Abdomen: +bs, soft, non tender, non distended,  no masses, no hepatomegaly, no splenomegaly Musculoskeletal: nontender, no swelling, no obvious deformity Extremities: no edema, no cyanosis, no clubbing Pulses: 2+ symmetric, upper and lower extremities, normal cap refill SKIN: WNL, exposed areas Neurological: alert, oriented x 3, CN2-12 intact, strength normal upper extremities and lower extremities, sensation normal throughout, DTRs 2+ throughout, no cerebellar signs, gait normal Psychiatric: normal affect, behavior normal, pleasant  Breast: nontender, no masses or lumps, no skin changes, no nipple discharge or inversion, no axillary lymphadenopathy Gyn: DEF 2019   Rectal: DEF stool cards   AORTA SCAN WNL EKG NSCSPT  Assessment:  1. CPE- Update screening labs/ History/ Immunizations/ Testing as needed. Advised healthy diet, QD exercise, increase H20 and continue RX/ Vitamins AD.  2. 3 month F/U for HTN, Cholesterol, Pre-Dm, D. Deficient. Needs healthy diet, cardio QD and obtain healthy weight. Check Labs, Check BP if >130/80 call office   Plan:   During the course of the visit the patient was educated and counseled about appropriate  screening and preventive services including:    Screening mammography  Bone densitometry screening  Screening recommendations, referrals:  Vaccinations: Tdap vaccine not indicated Influenza vaccine not indicated Pneumococcal vaccine not indicated Shingles vaccine not indicated Hep B vaccine not indicated  Nutrition assessed and recommended  Colonoscopy declined Mammogram ordered Pap smear not indicated Pelvic exam not indicated Recommended yearly ophthalmology/optometry visit for glaucoma screening and checkup Recommended yearly dental visit for hygiene and checkup Advanced directives - declined  Conditions/risks identified: BMI: Discussed weight loss, diet, and increase physical activity.  Increase physical activity: AHA recommends 150 minutes of physical activity a week.  Medications reviewed DEXA- ordered Diabetes at goal, ACE/ARB therapy Yes. Urinary Incontinence is not an issue: discussed non pharmacology and pharmacology options.  Fall risk: low- discussed PT, home fall assessment, medications.   Medicare Attestation I have personally reviewed: The patient's medical and social history Their use of alcohol, tobacco or illicit drugs Their current medications and supplements The patient's functional ability including ADLs,fall risks, home safety risks, cognitive, and hearing and visual impairment Diet and physical activities Evidence for depression or mood disorders  The patient's weight, height, BMI, and visual acuity have been recorded in the chart.  I have made referrals, counseling, and provided education to the patient based on review of the above and I have provided the patient with a written personalized care plan for preventive services.     Ardis Hughs, PA-C   06/03/2013

## 2013-06-04 LAB — HEPATIC FUNCTION PANEL
ALK PHOS: 69 U/L (ref 39–117)
ALT: 12 U/L (ref 0–35)
AST: 20 U/L (ref 0–37)
Albumin: 4.2 g/dL (ref 3.5–5.2)
BILIRUBIN DIRECT: 0.1 mg/dL (ref 0.0–0.3)
BILIRUBIN TOTAL: 0.4 mg/dL (ref 0.2–1.2)
Indirect Bilirubin: 0.3 mg/dL (ref 0.2–1.2)
Total Protein: 6.8 g/dL (ref 6.0–8.3)

## 2013-06-04 LAB — MICROALBUMIN / CREATININE URINE RATIO
CREATININE, URINE: 75 mg/dL
MICROALB UR: 0.5 mg/dL (ref 0.00–1.89)
Microalb Creat Ratio: 6.7 mg/g (ref 0.0–30.0)

## 2013-06-04 LAB — BASIC METABOLIC PANEL WITH GFR
BUN: 19 mg/dL (ref 6–23)
CALCIUM: 9.5 mg/dL (ref 8.4–10.5)
CHLORIDE: 104 meq/L (ref 96–112)
CO2: 25 meq/L (ref 19–32)
Creat: 1.22 mg/dL — ABNORMAL HIGH (ref 0.50–1.10)
GFR, EST AFRICAN AMERICAN: 51 mL/min — AB
GFR, Est Non African American: 44 mL/min — ABNORMAL LOW
GLUCOSE: 78 mg/dL (ref 70–99)
POTASSIUM: 4.6 meq/L (ref 3.5–5.3)
SODIUM: 140 meq/L (ref 135–145)

## 2013-06-04 LAB — INSULIN, FASTING: Insulin fasting, serum: 3 u[IU]/mL (ref 3–28)

## 2013-06-04 LAB — LIPID PANEL
CHOLESTEROL: 178 mg/dL (ref 0–200)
HDL: 64 mg/dL (ref >39)
LDL Cholesterol: 100 mg/dL — ABNORMAL HIGH (ref 0–99)
Total CHOL/HDL Ratio: 2.8 Ratio
Triglycerides: 70 mg/dL (ref <150)
VLDL: 14 mg/dL (ref 0–40)

## 2013-06-04 LAB — VITAMIN D 25 HYDROXY (VIT D DEFICIENCY, FRACTURES): Vit D, 25-Hydroxy: 66 ng/mL (ref 30–89)

## 2013-06-04 LAB — MAGNESIUM: Magnesium: 2 mg/dL (ref 1.5–2.5)

## 2013-06-04 LAB — TSH: TSH: 1.012 u[IU]/mL (ref 0.350–4.500)

## 2013-06-09 ENCOUNTER — Other Ambulatory Visit: Payer: Self-pay | Admitting: Emergency Medicine

## 2013-06-09 DIAGNOSIS — Z1231 Encounter for screening mammogram for malignant neoplasm of breast: Secondary | ICD-10-CM

## 2013-07-08 ENCOUNTER — Ambulatory Visit (HOSPITAL_COMMUNITY): Payer: Private Health Insurance - Indemnity

## 2013-07-09 ENCOUNTER — Ambulatory Visit (HOSPITAL_COMMUNITY)
Admission: RE | Admit: 2013-07-09 | Discharge: 2013-07-09 | Disposition: A | Discharge status: Home / Self Care | Payer: Private Health Insurance - Indemnity | Source: Ambulatory Visit | Attending: Emergency Medicine | Admitting: Emergency Medicine

## 2013-07-09 DIAGNOSIS — Z0000 Encounter for general adult medical examination without abnormal findings: Secondary | ICD-10-CM

## 2013-07-09 DIAGNOSIS — M8580 Other specified disorders of bone density and structure, unspecified site: Secondary | ICD-10-CM

## 2013-07-09 DIAGNOSIS — Z13820 Encounter for screening for osteoporosis: Secondary | ICD-10-CM | POA: Insufficient documentation

## 2013-07-09 DIAGNOSIS — Z780 Asymptomatic menopausal state: Secondary | ICD-10-CM | POA: Insufficient documentation

## 2013-07-09 DIAGNOSIS — Z1231 Encounter for screening mammogram for malignant neoplasm of breast: Secondary | ICD-10-CM | POA: Insufficient documentation

## 2013-07-10 ENCOUNTER — Ambulatory Visit (INDEPENDENT_AMBULATORY_CARE_PROVIDER_SITE_OTHER): Payer: Medicare HMO | Admitting: Emergency Medicine

## 2013-07-10 ENCOUNTER — Encounter: Payer: Self-pay | Admitting: Emergency Medicine

## 2013-07-10 VITALS — BP 154/68 | HR 72 | Temp 98.0°F | Resp 16 | Ht 63.75 in | Wt 135.0 lb

## 2013-07-10 DIAGNOSIS — L255 Unspecified contact dermatitis due to plants, except food: Secondary | ICD-10-CM

## 2013-07-10 MED ORDER — DEXAMETHASONE SODIUM PHOSPHATE 100 MG/10ML IJ SOLN
10.0000 mg | Freq: Once | INTRAMUSCULAR | Status: AC
  Administered 2013-07-10: 10 mg via INTRAMUSCULAR

## 2013-07-10 MED ORDER — DOXYCYCLINE HYCLATE 100 MG PO TABS: 100.0000 mg | ORAL_TABLET | Freq: Two times a day (BID) | ORAL | Status: DC

## 2013-07-10 MED ORDER — PREDNISONE 10 MG PO TABS: ORAL_TABLET | ORAL | Status: DC

## 2013-07-10 NOTE — Patient Instructions (Signed)
Cellulitis Take Oral steroid tomorrow if still red/ irritated face. Start Doxycycline antibiotic if more swelling occurs with the prednisone.  Cellulitis is an infection of the skin and the tissue under the skin. The infected area is usually red and tender. This happens most often in the arms and lower legs. HOME CARE   Take your antibiotic medicine as told. Finish the medicine even if you start to feel better.  Keep the infected arm or leg raised (elevated).  Put a warm cloth on the area up to 4 times per day.  Only take medicines as told by your doctor.  Keep all doctor visits as told. GET HELP RIGHT AWAY IF:   You have a fever.  You feel very sleepy.  You throw up (vomit) or have watery poop (diarrhea).  You feel sick and have muscle aches and pains.  You see red streaks on the skin coming from the infected area.  Your red area gets bigger or turns a dark color.  Your bone or joint under the infected area is painful after the skin heals.  Your infection comes back in the same area or different area.  You have a puffy (swollen) bump in the infected area.  You have new symptoms. MAKE SURE YOU:   Understand these instructions.  Will watch your condition.  Will get help right away if you are not doing well or get worse. Document Released: 06/20/2007 Document Revised: 07/03/2011 Document Reviewed: 03/19/2011 [facility] Patient Information 2015 [place], [place]. This information is not intended to replace advice given to you by your health care Shoshannah Faubert. Make sure you discuss any questions you have with your health care Jacion Dismore.

## 2013-07-10 NOTE — Progress Notes (Signed)
   Subjective:    Patient ID: Monique Barton, female    DOB: 10-Mar-1940, 73 y.o.   MRN: [id number]  HPI Comments: 73 yo WF was working in yard and pulling weeds. She thought she washed all of the exposed areas after completed job but did not think she had touched her face.She woke up today with red/ itchy face. She has not tried any OTC.   Poison Ivy     Medication List       This list is accurate as of: 07/10/13 11:59 PM.  Always use your most recent med list.               aspirin 81 MG tablet  Take 81 mg by mouth daily.     cholecalciferol 1000 UNITS tablet  Commonly known as:  VITAMIN D  Take 1,000 Units by mouth daily. TAKES 5000 IU QD     doxycycline 100 MG tablet  Commonly known as:  VIBRA-TABS  Take 1 tablet (100 mg total) by mouth 2 (two) times daily.     Magnesium 250 MG Tabs  Take by mouth daily.     olmesartan 40 MG tablet  Commonly known as:  BENICAR  Take 1 tablet (40 mg total) by mouth daily. TAKES 1/2 QD     pravastatin 40 MG tablet  Commonly known as:  PRAVACHOL  Take 1 tablet (40 mg total) by mouth daily.     predniSONE 10 MG tablet  Commonly known as:  DELTASONE  1 po TID x 3 days, 1 PO BID x 3 days, 1 po QD x 5 days       Allergies  Allergen Reactions  . Prednisone Other (See Comments)    HIGH DOSE CAUSES INSOMNIA      Review of Systems  Skin: Positive for rash.  All other systems reviewed and are negative.  BP 154/68  Pulse 72  Temp(Src) 98 F (36.7 C) (Temporal)  Resp 16  Ht 5' 3.75" (1.619 m)  Wt 135 lb (61.236 kg)  BMI 23.36 kg/m2     Objective:   Physical Exam  Nursing note and vitals reviewed. Constitutional: She is oriented to person, place, and time. She appears well-developed and well-nourished.  HENT:  Head: Normocephalic.    Right Ear: External ear normal.  Left Ear: External ear normal.  Nose: Nose normal.  Mouth/Throat: Oropharynx is clear and moist.  Eyes: Conjunctivae and EOM are normal. Pupils are equal,  round, and reactive to light.  Musculoskeletal: Normal range of motion.  Neurological: She is alert and oriented to person, place, and time.  Skin: Skin is warm. Rash noted. There is erythema.  + erythema patches over entire face with mild swelling  Psychiatric: She has a normal mood and affect. Judgment normal.          Assessment & Plan:  Rhus Dermatitis- Decadron 10 mg IM, Pred DP 10 mg AD tomorrow if no change, if any symptom increase also add Doxy 100 mg AD. Patient was advised of signs of cellulitis to monitor for. Hygiene explained

## 2013-09-04 ENCOUNTER — Other Ambulatory Visit: Payer: Self-pay | Admitting: Emergency Medicine

## 2013-10-06 ENCOUNTER — Ambulatory Visit (INDEPENDENT_AMBULATORY_CARE_PROVIDER_SITE_OTHER): Payer: Medicare HMO | Admitting: Internal Medicine

## 2013-10-06 ENCOUNTER — Encounter: Payer: Self-pay | Admitting: Internal Medicine

## 2013-10-06 VITALS — BP 156/82 | HR 58 | Temp 97.8°F | Resp 16 | Ht 63.75 in | Wt 135.0 lb

## 2013-10-06 DIAGNOSIS — Z79899 Other long term (current) drug therapy: Secondary | ICD-10-CM | POA: Insufficient documentation

## 2013-10-06 DIAGNOSIS — R7309 Other abnormal glucose: Secondary | ICD-10-CM

## 2013-10-06 DIAGNOSIS — Z23 Encounter for immunization: Secondary | ICD-10-CM

## 2013-10-06 DIAGNOSIS — E782 Mixed hyperlipidemia: Secondary | ICD-10-CM

## 2013-10-06 DIAGNOSIS — I10 Essential (primary) hypertension: Secondary | ICD-10-CM

## 2013-10-06 DIAGNOSIS — E559 Vitamin D deficiency, unspecified: Secondary | ICD-10-CM

## 2013-10-06 LAB — CBC WITH DIFFERENTIAL/PLATELET
Basophils Absolute: 0.1 10*3/uL (ref 0.0–0.1)
Basophils Relative: 1 % (ref 0–1)
Eosinophils Absolute: 0.2 10*3/uL (ref 0.0–0.7)
Eosinophils Relative: 4 % (ref 0–5)
HCT: 34.9 % — ABNORMAL LOW (ref 36.0–46.0)
Hemoglobin: 11.8 g/dL — ABNORMAL LOW (ref 12.0–15.0)
LYMPHS ABS: 2 10*3/uL (ref 0.7–4.0)
Lymphocytes Relative: 39 % (ref 12–46)
MCH: 28.9 pg (ref 26.0–34.0)
MCHC: 33.8 g/dL (ref 30.0–36.0)
MCV: 85.5 fL (ref 78.0–100.0)
MONOS PCT: 7 % (ref 3–12)
Monocytes Absolute: 0.4 10*3/uL (ref 0.1–1.0)
NEUTROS PCT: 49 % (ref 43–77)
Neutro Abs: 2.5 10*3/uL (ref 1.7–7.7)
Platelets: 211 10*3/uL (ref 150–400)
RBC: 4.08 MIL/uL (ref 3.87–5.11)
RDW: 14.2 % (ref 11.5–15.5)
WBC: 5.1 10*3/uL (ref 4.0–10.5)

## 2013-10-06 LAB — HEMOGLOBIN A1C
HEMOGLOBIN A1C: 5.8 % — AB (ref <5.7)
Mean Plasma Glucose: 120 mg/dL — ABNORMAL HIGH (ref <117)

## 2013-10-06 MED ORDER — HYDROCHLOROTHIAZIDE 25 MG PO TABS: ORAL_TABLET | ORAL | Status: DC

## 2013-10-06 NOTE — Progress Notes (Signed)
Patient ID: Monique Barton, female   DOB: [birth year], 73 y.o.   MRN: [id number]   This very nice 73 y.o.WWF presents for 3 month follow up with Hypertension, Hyperlipidemia, Pre-Diabetes and Vitamin D Deficiency.    Patient is treated for HTN & BP has not been controlled and today's BP is elevated at 156/82 mmHg. Patient denies any cardiac type chest pain, palpitations, dyspnea/orthopnea/PND, dizziness, claudication, or dependent edema.   Hyperlipidemia is controlled with diet & meds. Patient denies myalgias or other med SE's. Last Lipids were Total  Chol 178; HDL 64; LDL  100*; Trig 70 on 06/03/2013.   Also, the patient has history of PreDiabetes and patient denies any symptoms of reactive hypoglycemia, diabetic polys, paresthesias or visual blurring.  Last A1c was  6.0% on 06/03/2013.    Further, Patient has history of Vitamin D Deficiency and patient supplements vitamin D without any suspected side-effects. Last vitamin D was  66 on 06/03/2013   Medication List   aspirin 81 MG tablet  Take 81 mg by mouth daily.     BENICAR 40 MG tablet  Generic drug:  olmesartan  TAKE 1 TABLET BY MOUTH EVERY DAY     cholecalciferol 1000 UNITS tablet  Commonly known as:  VITAMIN D  Take 1,000 Units by mouth daily. TAKES 5000 IU QD     hydrochlorothiazide 25 MG tablet  Commonly known as:  HYDRODIURIL  Take 1/2 to 1 tablet daily for BP & fluid     Magnesium 250 MG Tabs  Take by mouth daily.     pravastatin 40 MG tablet  Commonly known as:  PRAVACHOL  Take 1 tablet (40 mg total) by mouth daily.     Allergies  Allergen Reactions  . Prednisone Other (See Comments)    HIGH DOSE CAUSES INSOMNIA   PMHx:   Past Medical History  Diagnosis Date  . Hypertension   . Hyperlipidemia   . Unspecified vitamin D deficiency   . Other abnormal glucose   . Wrist fracture, right    FHx:    Reviewed / unchanged SHx:    Reviewed / unchanged  Systems Review:  Constitutional: Denies fever, chills, wt changes,  headaches, insomnia, fatigue, night sweats, change in appetite. Eyes: Denies redness, blurred vision, diplopia, discharge, itchy, watery eyes.  ENT: Denies discharge, congestion, post nasal drip, epistaxis, sore throat, earache, hearing loss, dental pain, tinnitus, vertigo, sinus pain, snoring.  CV: Denies chest pain, palpitations, irregular heartbeat, syncope, dyspnea, diaphoresis, orthopnea, PND, claudication or edema. Respiratory: denies cough, dyspnea, DOE, pleurisy, hoarseness, laryngitis, wheezing.  Gastrointestinal: Denies dysphagia, odynophagia, heartburn, reflux, water brash, abdominal pain or cramps, nausea, vomiting, bloating, diarrhea, constipation, hematemesis, melena, hematochezia  or hemorrhoids. Genitourinary: Denies dysuria, frequency, urgency, nocturia, hesitancy, discharge, hematuria or flank pain. Musculoskeletal: Denies arthralgias, myalgias, stiffness, jt. swelling, pain, limping or strain/sprain.  Skin: Denies pruritus, rash, hives, warts, acne, eczema or change in skin lesion(s). Neuro: No weakness, tremor, incoordination, spasms, paresthesia or pain. Psychiatric: Denies confusion, memory loss or sensory loss. Endo: Denies change in weight, skin or hair change.  Heme/Lymph: No excessive bleeding, bruising or enlarged lymph nodes.  Exam:  BP 156/82  Pulse 58  Temp(Src) 97.8 F (36.6 C) (Temporal)  Resp 16  Ht 5' 3.75" (1.619 m)  Wt 135 lb (61.236 kg)  BMI 23.36 kg/m2  Appears well nourished and in no distress. Eyes: PERRLA, EOMs, conjunctiva no swelling or erythema. Sinuses: No frontal/maxillary tenderness ENT/Mouth: EAC's clear, TM's nl w/o erythema, bulging. Nares  clear w/o erythema, swelling, exudates. Oropharynx clear without erythema or exudates. Oral hygiene is good. Tongue normal, non obstructing. Hearing intact.  Neck: Supple. Thyroid nl. Car 2+/2+ without bruits, nodes or JVD. Chest: Respirations nl with BS clear & equal w/o rales, rhonchi, wheezing or  stridor.  Cor: Heart sounds normal w/ regular rate and rhythm without sig. murmurs, gallops, clicks, or rubs. Peripheral pulses normal and equal  without edema.  Abdomen: Soft & bowel sounds normal. Non-tender w/o guarding, rebound, hernias, masses, or organomegaly.  Lymphatics: Unremarkable.  Musculoskeletal: Full ROM all peripheral extremities, joint stability, 5/5 strength, and normal gait.  Skin: Warm, dry without exposed rashes, lesions or ecchymosis apparent.  Neuro: Cranial nerves intact, reflexes equal bilaterally. Sensory-motor testing grossly intact. Tendon reflexes grossly intact.  Pysch: Alert & oriented x 3.  Insight and judgement nl & appropriate. No ideations.  Assessment and Plan:  1. Hypertension - Continue monitor blood pressure at home. Continue diet/meds same.  2. Hyperlipidemia - Continue diet/meds, exercise,& lifestyle modifications. Continue monitor periodic cholesterol/liver & renal functions   3. Pre-Diabetes - Continue diet, exercise, lifestyle modifications. Monitor appropriate labs.  4. Vitamin D Deficiency - Continue supplementation.  Recommended regular exercise, BP monitoring, weight control, and discussed med and SE's. Recommended labs to assess and monitor clinical status. Further disposition pending results of labs.

## 2013-10-06 NOTE — Patient Instructions (Signed)

## 2013-10-07 LAB — BASIC METABOLIC PANEL WITH GFR
BUN: 20 mg/dL (ref 6–23)
CO2: 28 mEq/L (ref 19–32)
Calcium: 9.5 mg/dL (ref 8.4–10.5)
Chloride: 106 mEq/L (ref 96–112)
Creat: 1.22 mg/dL — ABNORMAL HIGH (ref 0.50–1.10)
GFR, Est African American: 51 mL/min — ABNORMAL LOW
GFR, Est Non African American: 44 mL/min — ABNORMAL LOW
Glucose, Bld: 85 mg/dL (ref 70–99)
Potassium: 4.9 mEq/L (ref 3.5–5.3)
SODIUM: 142 meq/L (ref 135–145)

## 2013-10-07 LAB — LIPID PANEL
CHOL/HDL RATIO: 2.7 ratio
Cholesterol: 158 mg/dL (ref 0–200)
HDL: 59 mg/dL (ref >39)
LDL Cholesterol: 84 mg/dL (ref 0–99)
Triglycerides: 77 mg/dL (ref <150)
VLDL: 15 mg/dL (ref 0–40)

## 2013-10-07 LAB — MAGNESIUM: Magnesium: 2.2 mg/dL (ref 1.5–2.5)

## 2013-10-07 LAB — TSH: TSH: 0.941 u[IU]/mL (ref 0.350–4.500)

## 2013-10-07 LAB — INSULIN, FASTING: Insulin fasting, serum: 2.3 u[IU]/mL (ref 2.0–19.6)

## 2013-10-07 LAB — VITAMIN D 25 HYDROXY (VIT D DEFICIENCY, FRACTURES): VIT D 25 HYDROXY: 81 ng/mL (ref 30–89)

## 2013-10-07 LAB — HEPATIC FUNCTION PANEL
ALT: 13 U/L (ref 0–35)
AST: 22 U/L (ref 0–37)
Albumin: 4.2 g/dL (ref 3.5–5.2)
Alkaline Phosphatase: 67 U/L (ref 39–117)
BILIRUBIN DIRECT: 0.1 mg/dL (ref 0.0–0.3)
BILIRUBIN INDIRECT: 0.4 mg/dL (ref 0.2–1.2)
Total Bilirubin: 0.5 mg/dL (ref 0.2–1.2)
Total Protein: 6.8 g/dL (ref 6.0–8.3)

## 2013-11-16 ENCOUNTER — Other Ambulatory Visit: Payer: Self-pay | Admitting: Emergency Medicine

## 2013-12-21 ENCOUNTER — Other Ambulatory Visit: Payer: Self-pay | Admitting: *Deleted

## 2013-12-21 MED ORDER — OLMESARTAN MEDOXOMIL 40 MG PO TABS: 40.0000 mg | ORAL_TABLET | Freq: Every day | ORAL | Status: DC

## 2014-02-09 ENCOUNTER — Ambulatory Visit: Payer: Self-pay | Admitting: Physician Assistant

## 2014-06-07 ENCOUNTER — Encounter: Payer: Self-pay | Admitting: Emergency Medicine

## 2014-06-08 ENCOUNTER — Ambulatory Visit (INDEPENDENT_AMBULATORY_CARE_PROVIDER_SITE_OTHER): Payer: Medicare HMO | Admitting: Internal Medicine

## 2014-06-08 ENCOUNTER — Encounter: Payer: Self-pay | Admitting: Internal Medicine

## 2014-06-08 VITALS — BP 118/62 | HR 60 | Temp 98.2°F | Resp 16 | Ht 63.25 in | Wt 129.0 lb

## 2014-06-08 DIAGNOSIS — R7309 Other abnormal glucose: Secondary | ICD-10-CM

## 2014-06-08 DIAGNOSIS — F329 Major depressive disorder, single episode, unspecified: Secondary | ICD-10-CM

## 2014-06-08 DIAGNOSIS — Z79899 Other long term (current) drug therapy: Secondary | ICD-10-CM

## 2014-06-08 DIAGNOSIS — I10 Essential (primary) hypertension: Secondary | ICD-10-CM

## 2014-06-08 DIAGNOSIS — F32A Depression, unspecified: Secondary | ICD-10-CM

## 2014-06-08 DIAGNOSIS — Z9181 History of falling: Secondary | ICD-10-CM

## 2014-06-08 DIAGNOSIS — Z0000 Encounter for general adult medical examination without abnormal findings: Secondary | ICD-10-CM

## 2014-06-08 DIAGNOSIS — E559 Vitamin D deficiency, unspecified: Secondary | ICD-10-CM

## 2014-06-08 DIAGNOSIS — E782 Mixed hyperlipidemia: Secondary | ICD-10-CM

## 2014-06-08 DIAGNOSIS — Z1212 Encounter for screening for malignant neoplasm of rectum: Secondary | ICD-10-CM

## 2014-06-08 DIAGNOSIS — R5383 Other fatigue: Secondary | ICD-10-CM

## 2014-06-08 DIAGNOSIS — R7303 Prediabetes: Secondary | ICD-10-CM

## 2014-06-08 DIAGNOSIS — Z23 Encounter for immunization: Secondary | ICD-10-CM

## 2014-06-08 LAB — HEPATIC FUNCTION PANEL
ALBUMIN: 4.3 g/dL (ref 3.5–5.2)
ALK PHOS: 62 U/L (ref 39–117)
ALT: 18 U/L (ref 0–35)
AST: 24 U/L (ref 0–37)
Bilirubin, Direct: 0.1 mg/dL (ref 0.0–0.3)
Indirect Bilirubin: 0.4 mg/dL (ref 0.2–1.2)
Total Bilirubin: 0.5 mg/dL (ref 0.2–1.2)
Total Protein: 6.8 g/dL (ref 6.0–8.3)

## 2014-06-08 LAB — BASIC METABOLIC PANEL WITH GFR
BUN: 34 mg/dL — ABNORMAL HIGH (ref 6–23)
CO2: 28 mEq/L (ref 19–32)
Calcium: 9.7 mg/dL (ref 8.4–10.5)
Chloride: 103 mEq/L (ref 96–112)
Creat: 1.86 mg/dL — ABNORMAL HIGH (ref 0.50–1.10)
GFR, Est African American: 30 mL/min — ABNORMAL LOW
GFR, Est Non African American: 26 mL/min — ABNORMAL LOW
Glucose, Bld: 100 mg/dL — ABNORMAL HIGH (ref 70–99)
Potassium: 4.9 mEq/L (ref 3.5–5.3)
Sodium: 140 mEq/L (ref 135–145)

## 2014-06-08 LAB — CBC WITH DIFFERENTIAL/PLATELET
BASOS PCT: 0 % (ref 0–1)
Basophils Absolute: 0 10*3/uL (ref 0.0–0.1)
Eosinophils Absolute: 0.1 10*3/uL (ref 0.0–0.7)
Eosinophils Relative: 2 % (ref 0–5)
HCT: 36.4 % (ref 36.0–46.0)
Hemoglobin: 11.9 g/dL — ABNORMAL LOW (ref 12.0–15.0)
Lymphocytes Relative: 30 % (ref 12–46)
Lymphs Abs: 2.1 10*3/uL (ref 0.7–4.0)
MCH: 29.3 pg (ref 26.0–34.0)
MCHC: 32.7 g/dL (ref 30.0–36.0)
MCV: 89.7 fL (ref 78.0–100.0)
MPV: 11 fL (ref 8.6–12.4)
Monocytes Absolute: 0.5 10*3/uL (ref 0.1–1.0)
Monocytes Relative: 7 % (ref 3–12)
NEUTROS ABS: 4.3 10*3/uL (ref 1.7–7.7)
NEUTROS PCT: 61 % (ref 43–77)
Platelets: 234 10*3/uL (ref 150–400)
RBC: 4.06 MIL/uL (ref 3.87–5.11)
RDW: 14.1 % (ref 11.5–15.5)
WBC: 7.1 10*3/uL (ref 4.0–10.5)

## 2014-06-08 LAB — LIPID PANEL
Cholesterol: 170 mg/dL (ref 0–200)
HDL: 65 mg/dL (ref >=46)
LDL Cholesterol: 88 mg/dL (ref 0–99)
TRIGLYCERIDES: 84 mg/dL (ref <150)
Total CHOL/HDL Ratio: 2.6 Ratio
VLDL: 17 mg/dL (ref 0–40)

## 2014-06-08 LAB — IRON AND TIBC
%SAT: 34 % (ref 20–55)
IRON: 106 ug/dL (ref 42–145)
TIBC: 316 ug/dL (ref 250–470)
UIBC: 210 ug/dL (ref 125–400)

## 2014-06-08 LAB — HEMOGLOBIN A1C
Hgb A1c MFr Bld: 6.1 % — ABNORMAL HIGH (ref <5.7)
MEAN PLASMA GLUCOSE: 128 mg/dL — AB (ref <117)

## 2014-06-08 LAB — VITAMIN B12: Vitamin B-12: 483 pg/mL (ref 211–911)

## 2014-06-08 LAB — TSH: TSH: 0.645 u[IU]/mL (ref 0.350–4.500)

## 2014-06-08 LAB — MAGNESIUM: MAGNESIUM: 2.2 mg/dL (ref 1.5–2.5)

## 2014-06-08 MED ORDER — SERTRALINE HCL 50 MG PO TABS: ORAL_TABLET | ORAL | Status: DC

## 2014-06-08 MED ORDER — LOSARTAN POTASSIUM 50 MG PO TABS: 50.0000 mg | ORAL_TABLET | Freq: Every day | ORAL | Status: DC

## 2014-06-08 NOTE — Addendum Note (Signed)
Addended by: Starlyn Skeans A on: 06/08/2014 10:17 AM   Modules accepted: Miquel Dunn

## 2014-06-08 NOTE — Progress Notes (Signed)
Patient ID: Monique Barton, female   DOB: Jun 04, 1940, 73 y.o.   MRN: [id number]   [facility] VISIT AND CPE  Assessment:    1. Essential hypertension -change benicar to coozar due to cost - Urinalysis, Routine w reflex microscopic - Microalbumin / creatinine urine ratio - EKG 12-Lead - TSH  2. Hyperlipidemia -cont meds - Lipid panel  3. Prediabetes  - Hemoglobin A1c - Insulin, random  4. Vitamin D deficiency -cont supplement - Vit D  25 hydroxy (rtn osteoporosis monitoring)  5. Medication management  - CBC with Differential/Platelet - BASIC METABOLIC PANEL WITH GFR - Hepatic function panel - Magnesium  6. Screening for rectal cancer -order cologuard - POC Hemoccult Bld/Stl (3-Cd Home Screen); Future  7. Other fatigue  - Iron and TIBC - Vitamin B12  8. Need for prophylactic vaccination against Streptococcus pneumoniae (pneumococcus)  - Pneumococcal conjugate vaccine 13-valent  9. Depression -zoloft  10. At low risk for fall   11. Routine general medical examination at a health care facility      Plan:   During the course of the visit the patient was educated and counseled about appropriate screening and preventive services including:    Pneumococcal vaccine   Influenza vaccine  Td vaccine  Screening electrocardiogram  Bone densitometry screening  Colorectal cancer screening  Diabetes screening  Glaucoma screening  Nutrition counseling   Advanced directives: requested  Screening recommendations, referrals: Vaccinations:  Immunization History  Administered Date(s) Administered  . Influenza Nasal 11/06/2011  . Influenza, High Dose Seasonal PF 10/06/2013  . Influenza-Unspecified 10/24/2012  . Pneumococcal-Unspecified 02/07/2006  . Tdap 04/04/2011  . Zoster 01/15/2009  Prevnar 13 due today.     Nutrition assessed and recommended  Colonoscopy declined, cologuard requested.   Recommended yearly  ophthalmology/optometry visit for glaucoma screening and checkup Recommended yearly dental visit for hygiene and checkup Advanced directives - requested  Conditions/risks identified: BMI: Discussed weight loss, diet, and increase physical activity.  Increase physical activity: AHA recommends 150 minutes of physical activity a week.  Medications reviewed Diabetes is at goal, ACE/ARB therapy: Yes. Urinary Incontinence is not an issue: discussed non pharmacology and pharmacology options.  Fall risk: low- discussed PT, home fall assessment, medications.   Subjective:    Monique Barton is a 74 y.o.  female who presents for Medicare Annual Wellness Visit and complete physical.  Date of last medicare wellness visit is unknown.  She has had elevated blood pressure. Her blood pressure has been controlled at home, & today their BP is BP: 118/62 mmHg She does not workout.  She is doing line dancing and sometimes gardens.   She denies chest pain, shortness of breath, dizziness. She would like to come off her benicar as it is too expensive and would like a cheaper medication.  She is on cholesterol medication and denies myalgias. Her cholesterol is at goal. The cholesterol last visit was:  Lab Results  Component Value Date   CHOL 158 10/06/2013   HDL 59 10/06/2013   LDLCALC 84 10/06/2013   TRIG 77 10/06/2013   CHOLHDL 2.7 10/06/2013    She has had prediabetes. She has not been working on diet and exercise for prediabetes, and denies foot ulcerations, hyperglycemia, hypoglycemia , increased appetite, nausea, paresthesia of the feet, polydipsia, polyuria, visual disturbances, vomiting and weight loss. Last A1C in the office was:  Lab Results  Component Value Date   HGBA1C 5.8* 10/06/2013  Yesterday for lunch she had chicken pie and sliced  tomato.  For dinner she is having a half of a ham sandwich.     Patient is on Vitamin D supplement.   Lab Results  Component Value Date   VD25OH 37  10/06/2013      Patient states that her friend passed away in 2022-05-03 and since that time she has been having emotional mood swings and crying spells.        Names of Other Physician/Practitioners you currently use: 1. [facility] here for primary care 2. Dr. Dolores Lory, retired, eye doctor, last visit 05-02-13 3. Not seeing one, dentist, last visit  Patient Care Team: Unk Pinto, MD as PCP - General (Internal Medicine)  Medication Review: Current Outpatient Prescriptions on File Prior to Visit  Medication Sig Dispense Refill  . aspirin 81 MG tablet Take 81 mg by mouth daily.    . cholecalciferol (VITAMIN D) 1000 UNITS tablet Take 1,000 Units by mouth daily. TAKES 5000 IU QD    . hydrochlorothiazide (HYDRODIURIL) 25 MG tablet Take 1/2 to 1 tablet daily for BP & fluid 90 tablet 99  . Magnesium 250 MG TABS Take by mouth daily.    Marland Kitchen olmesartan (BENICAR) 40 MG tablet Take 1 tablet (40 mg total) by mouth daily. 30 tablet 3  . pravastatin (PRAVACHOL) 40 MG tablet TAKE 1 TABLET (40 MG TOTAL) BY MOUTH DAILY. 90 tablet 1   No current facility-administered medications on file prior to visit.    Current Problems (verified) Patient Active Problem List   Diagnosis Date Noted  . Medication management 10/06/2013  . Essential hypertension 11/20/2012  . Hyperlipidemia 11/20/2012  . Prediabetes 11/20/2012  . Vitamin D deficiency 11/20/2012    Screening Tests Health Maintenance  Topic Date Due  . COLONOSCOPY  01/16/2007  . PNA vac Low Risk Adult (2 of 2 - PCV13) 02/08/2007  . INFLUENZA VACCINE  08/16/2014  . MAMMOGRAM  07/10/2015  . TETANUS/TDAP  04/03/2021  . DEXA SCAN  Completed  . ZOSTAVAX  Completed    Immunization History  Administered Date(s) Administered  . Influenza Nasal 11/06/2011  . Influenza, High Dose Seasonal PF 10/06/2013  . Influenza-Unspecified 10/24/2012  . Pneumococcal-Unspecified 02/07/2006  . Tdap 04/04/2011  . Zoster  01/15/2009    Preventative care: Last colonoscopy: 05/02/1997  History reviewed: allergies, current medications, past family history, past medical history, past social history, past surgical history and problem list  Risk Factors: Tobacco History  Substance Use Topics  . Smoking status: Passive Smoke Exposure - Never Smoker  . Smokeless tobacco: Not on file     Comment: Second hand smoke exposure  . Alcohol Use: No   She does not smoke.  Patient is not a former smoker. Are there smokers in your home (other than you)?  No Alcohol Current alcohol use: none  Caffeine Current caffeine use: coffee 3 /day  Exercise Current exercise: aerobics and walking  Nutrition/Diet Current diet: in general, a "healthy" diet    Cardiac risk factors: advanced age (older than 79 for men, 70 for women), dyslipidemia, hypertension and sedentary lifestyle.  Depression Screen (Note: if answer to either of the following is "Yes", a more complete depression screening is indicated)   Q1: Over the past two weeks, have you felt down, depressed or hopeless? No  Q2: Over the past two weeks, have you felt little interest or pleasure in doing things? No  Have you lost interest or pleasure in daily life? No  Do you often feel hopeless? No  Do you cry easily over simple problems? No  Activities of Daily Living In your present state of health, do you have any difficulty performing the following activities?:  Driving? No Managing money?  No Feeding yourself? No Getting from bed to chair? No Climbing a flight of stairs? No Preparing food and eating?: No Bathing or showering? No Getting dressed: No Getting to the toilet? No Using the toilet:No Moving around from place to place: No In the past year have you fallen or had a near fall?:No   Are you sexually active?  No  Do you have more than one partner?  No  Vision Difficulties: No  Hearing Difficulties: No Do you often ask people to speak up or repeat  themselves? No Do you experience ringing or noises in your ears? No Do you have difficulty understanding soft or whispered voices? Sometimes.  Cognition  Do you feel that you have a problem with memory?No  Do you often misplace items? No  Do you feel safe at home?  Yes  Advanced directives Does patient have a [place]? No Does patient have a Living Will? No  Past Medical History  Diagnosis Date  . Hypertension   . Hyperlipidemia   . Unspecified vitamin D deficiency   . Other abnormal glucose   . Wrist fracture, right    Past Surgical History  Procedure Laterality Date  . Eye surgery Bilateral 2012    Review of Systems  Constitutional: Negative for fever, chills, weight loss and malaise/fatigue.  HENT: Negative for congestion, ear pain, nosebleeds and sore throat.   Eyes: Negative.   Respiratory: Negative for cough, shortness of breath and wheezing.   Cardiovascular: Negative for chest pain, palpitations and leg swelling.  Gastrointestinal: Negative for heartburn, nausea, vomiting, diarrhea, constipation, blood in stool and melena.  Genitourinary: Negative for dysuria, urgency and frequency.  Neurological: Negative for dizziness, sensory change, loss of consciousness and headaches.  Psychiatric/Behavioral: Positive for depression. The patient is not nervous/anxious and does not have insomnia.      Objective:     BP 118/62 mmHg  Pulse 60  Temp(Src) 98.2 F (36.8 C) (Temporal)  Resp 16  Ht 5' 3.25" (1.607 m)  Wt 129 lb (58.514 kg)  BMI 22.66 kg/m2  General Appearance: Well nourished, alert, WD/WN, female and in no apparent distress. Eyes: PERRLA, EOMs, conjunctiva no swelling or erythema, normal fundi and vessels. Sinuses: No frontal/maxillary tenderness ENT/Mouth: EACs patent / TMs  nl. Nares clear without erythema, swelling, mucoid exudates. Oral hygiene is good. No erythema, swelling, or exudate. Tongue normal, non-obstructing. Tonsils not  swollen or erythematous. Hearing normal.  Neck: Supple, thyroid normal. No bruits, nodes or JVD. Respiratory: Respiratory effort normal.  BS equal and clear bilateral without rales, rhonci, wheezing or stridor. Cardio: Heart sounds are normal with regular rate and rhythm and no murmurs, rubs or gallops. Peripheral pulses are normal and equal bilaterally without edema. No aortic or femoral bruits. Chest: symmetric with normal excursions and percussion. Breasts: Symmetric, without lumps, nipple discharge, retractions, or fibrocystic changes.  Abdomen: Flat, soft  with nl bowel sounds. Nontender, no guarding, rebound, hernias, masses, or organomegaly.  Lymphatics: Non tender without lymphadenopathy.  Genitourinary:  Musculoskeletal: Full ROM all peripheral extremities, joint stability, 5/5 strength, and normal gait. Skin: Warm and dry without rashes, lesions, cyanosis, clubbing or  ecchymosis.  Neuro: Cranial nerves intact, reflexes equal bilaterally. Normal muscle tone, no cerebellar symptoms. Sensation intact.  Pysch: Alert and oriented X 3,  normal affect, Insight and Judgment appropriate.   Cognitive Testing  Alert? Yes  Normal Appearance?Yes  Oriented to person? Yes  Place? Yes   Time? Yes  Recall of three objects?  Yes  Can perform simple calculations? Yes  Displays appropriate judgment? Yes  Can read the correct time from a watch/clock?Yes  Medicare Attestation I have personally reviewed: The patient's medical and social history Their use of alcohol, tobacco or illicit drugs Their current medications and supplements The patient's functional ability including ADLs,fall risks, home safety risks, cognitive, and hearing and visual impairment Diet and physical activities Evidence for depression or mood disorders  The patient's weight, height, BMI, and visual acuity have been recorded in the chart.  I have made referrals, counseling, and provided education to the patient based on review  of the above and I have provided the patient with a written personalized care plan for preventive services.  Over 40 minutes of exam, counseling, chart review was performed.   Starlyn Skeans, PA-C   06/08/2014

## 2014-06-08 NOTE — Patient Instructions (Signed)
Preventive Care for Adults  A healthy lifestyle and preventive care can promote health and wellness. Preventive health guidelines for women include the following key practices.  A routine yearly physical is a good way to check with your health care provider about your health and preventive screening. It is a chance to share any concerns and updates on your health and to receive a thorough exam.  Visit your dentist for a routine exam and preventive care every 6 months. Brush your teeth twice a day and floss once a day. Good oral hygiene prevents tooth decay and gum disease.  The frequency of eye exams is based on your age, health, family medical history, use of contact lenses, and other factors. Follow your health care provider's recommendations for frequency of eye exams.  Eat a healthy diet. Foods like vegetables, fruits, whole grains, low-fat dairy products, and lean protein foods contain the nutrients you need without too many calories. Decrease your intake of foods high in solid fats, added sugars, and salt. Eat the right amount of calories for you.Get information about a proper diet from your health care provider, if necessary.  Regular physical exercise is one of the most important things you can do for your health. Most adults should get at least 150 minutes of moderate-intensity exercise (any activity that increases your heart rate and causes you to sweat) each week. In addition, most adults need muscle-strengthening exercises on 2 or more days a week.  Maintain a healthy weight. The body mass index (BMI) is a screening tool to identify possible weight problems. It provides an estimate of body fat based on height and weight. Your health care provider can find your BMI and can help you achieve or maintain a healthy weight.For adults 20 years and older:  A BMI below 18.5 is considered underweight.  A BMI of 18.5 to 24.9 is normal.  A BMI of 25 to 29.9 is considered overweight.  A BMI  of 30 and above is considered obese.  Maintain normal blood lipids and cholesterol levels by exercising and minimizing your intake of saturated fat. Eat a balanced diet with plenty of fruit and vegetables. If your lipid or cholesterol levels are high, you are over 50, or you are at high risk for heart disease, you may need your cholesterol levels checked more frequently.Ongoing high lipid and cholesterol levels should be treated with medicines if diet and exercise are not working.  If you smoke, find out from your health care provider how to quit. If you do not use tobacco, do not start.  Lung cancer screening is recommended for adults aged 2-80 years who are at high risk for developing lung cancer because of a history of smoking. A yearly low-dose CT scan of the lungs is recommended for people who have at least a 30-pack-year history of smoking and are a current smoker or have quit within the past 15 years. A pack year of smoking is smoking an average of 1 pack of cigarettes a day for 1 year (for example: 1 pack a day for 30 years or 2 packs a day for 15 years). Yearly screening should continue until the smoker has stopped smoking for at least 15 years. Yearly screening should be stopped for people who develop a health problem that would prevent them from having lung cancer treatment.  Avoid use of street drugs. Do not share needles with anyone. Ask for help if you need support or instructions about stopping the use of drugs.  High  blood pressure causes heart disease and increases the risk of stroke.  Ongoing high blood pressure should be treated with medicines if weight loss and exercise do not work.  If you are 55-79 years old, ask your health care provider if you should take aspirin to prevent strokes.  Diabetes screening involves taking a blood sample to check your fasting blood sugar level. This should be done once every 3 years, after age 45, if you are within normal weight and without risk  factors for diabetes. Testing should be considered at a younger age or be carried out more frequently if you are overweight and have at least 1 risk factor for diabetes.  Breast cancer screening is essential preventive care for women. You should practice "breast self-awareness." This means understanding the normal appearance and feel of your breasts and may include breast self-examination. Any changes detected, no matter how small, should be reported to a health care provider. Women in their 20s and 30s should have a clinical breast exam (CBE) by a health care provider as part of a regular health exam every 1 to 3 years. After age 40, women should have a CBE every year. Starting at age 40, women should consider having a mammogram (breast X-ray test) every year. Women who have a family history of breast cancer should talk to their health care provider about genetic screening. Women at a high risk of breast cancer should talk to their health care providers about having an MRI and a mammogram every year.  Breast cancer gene (BRCA)-related cancer risk assessment is recommended for women who have family members with BRCA-related cancers. BRCA-related cancers include breast, ovarian, tubal, and peritoneal cancers. Having family members with these cancers may be associated with an increased risk for harmful changes (mutations) in the breast cancer genes BRCA1 and BRCA2. Results of the assessment will determine the need for genetic counseling and BRCA1 and BRCA2 testing.  Routine pelvic exams to screen for cancer are no longer recommended for nonpregnant women who are considered low risk for cancer of the pelvic organs (ovaries, uterus, and vagina) and who do not have symptoms. Ask your health care provider if a screening pelvic exam is right for you.  If you have had past treatment for cervical cancer or a condition that could lead to cancer, you need Pap tests and screening for cancer for at least 20 years after  your treatment. If Pap tests have been discontinued, your risk factors (such as having a new sexual partner) need to be reassessed to determine if screening should be resumed. Some women have medical problems that increase the chance of getting cervical cancer. In these cases, your health care provider may recommend more frequent screening and Pap tests.    Colorectal cancer can be detected and often prevented. Most routine colorectal cancer screening begins at the age of 50 years and continues through age 75 years. However, your health care provider may recommend screening at an earlier age if you have risk factors for colon cancer. On a yearly basis, your health care provider may provide home test kits to check for hidden blood in the stool. Use of a small camera at the end of a tube, to directly examine the colon (sigmoidoscopy or colonoscopy), can detect the earliest forms of colorectal cancer. Talk to your health care provider about this at age 50, when routine screening begins. Direct exam of the colon should be repeated every 5-10 years through age 75 years, unless early forms of pre-cancerous   polyps or small growths are found.  Osteoporosis is a disease in which the bones lose minerals and strength with aging. This can result in serious bone fractures or breaks. The risk of osteoporosis can be identified using a bone density scan. Women ages 68 years and over and women at risk for fractures or osteoporosis should discuss screening with their health care providers. Ask your health care provider whether you should take a calcium supplement or vitamin D to reduce the rate of osteoporosis.  Menopause can be associated with physical symptoms and risks. Hormone replacement therapy is available to decrease symptoms and risks. You should talk to your health care provider about whether hormone replacement therapy is right for you.  Use sunscreen. Apply sunscreen liberally and repeatedly throughout the day.  You should seek shade when your shadow is shorter than you. Protect yourself by wearing long sleeves, pants, a wide-brimmed hat, and sunglasses year round, whenever you are outdoors.  Once a month, do a whole body skin exam, using a mirror to look at the skin on your back. Tell your health care provider of new moles, moles that have irregular borders, moles that are larger than a pencil eraser, or moles that have changed in shape or color.  Stay current with required vaccines (immunizations).  Influenza vaccine. All adults should be immunized every year.  Tetanus, diphtheria, and acellular pertussis (Td, Tdap) vaccine. Pregnant women should receive 1 dose of Tdap vaccine during each pregnancy. The dose should be obtained regardless of the length of time since the last dose. Immunization is preferred during the 27th-36th week of gestation. An adult who has not previously received Tdap or who does not know her vaccine status should receive 1 dose of Tdap. This initial dose should be followed by tetanus and diphtheria toxoids (Td) booster doses every 10 years. Adults with an unknown or incomplete history of completing a 3-dose immunization series with Td-containing vaccines should begin or complete a primary immunization series including a Tdap dose. Adults should receive a Td booster every 10 years.    Zoster vaccine. One dose is recommended for adults aged 36 years or older unless certain conditions are present.    Pneumococcal 13-valent conjugate (PCV13) vaccine. When indicated, a person who is uncertain of her immunization history and has no record of immunization should receive the PCV13 vaccine. An adult aged 1 years or older who has certain medical conditions and has not been previously immunized should receive 1 dose of PCV13 vaccine. This PCV13 should be followed with a dose of pneumococcal polysaccharide (PPSV23) vaccine. The PPSV23 vaccine dose should be obtained at least 8 weeks after the  dose of PCV13 vaccine. An adult aged 73 years or older who has certain medical conditions and previously received 1 or more doses of PPSV23 vaccine should receive 1 dose of PCV13. The PCV13 vaccine dose should be obtained 1 or more years after the last PPSV23 vaccine dose.    Pneumococcal polysaccharide (PPSV23) vaccine. When PCV13 is also indicated, PCV13 should be obtained first. All adults aged 47 years and older should be immunized. An adult younger than age 86 years who has certain medical conditions should be immunized. Any person who resides in a nursing home or long-term care facility should be immunized. An adult smoker should be immunized. People with an immunocompromised condition and certain other conditions should receive both PCV13 and PPSV23 vaccines. People with human immunodeficiency virus (HIV) infection should be immunized as soon as possible after diagnosis. Immunization  chemotherapy or radiation therapy should be avoided. Routine use of PPSV23 vaccine is not recommended for American Indians, [place] Natives, or people younger than 65 years unless there are medical conditions that require PPSV23 vaccine. When indicated, people who have unknown immunization and have no record of immunization should receive PPSV23 vaccine. One-time revaccination 5 years after the first dose of PPSV23 is recommended for people aged 19-64 years who have chronic kidney failure, nephrotic syndrome, asplenia, or immunocompromised conditions. People who received 1-2 doses of PPSV23 before age 65 years should receive another dose of PPSV23 vaccine at age 65 years or later if at least 5 years have passed since the previous dose. Doses of PPSV23 are not needed for people immunized with PPSV23 at or after age 65 years.   Preventive Services / Frequency  Ages 65 years and over  Blood pressure check.  Lipid and cholesterol check.  Lung cancer screening. / Every year if you are aged 55-80 years and have a  30-pack-year history of smoking and currently smoke or have quit within the past 15 years. Yearly screening is stopped once you have quit smoking for at least 15 years or develop a health problem that would prevent you from having lung cancer treatment.  Clinical breast exam.** / Every year after age 40 years.  BRCA-related cancer risk assessment.** / For women who have family members with a BRCA-related cancer (breast, ovarian, tubal, or peritoneal cancers).  Mammogram.** / Every year beginning at age 40 years and continuing for as long as you are in good health. Consult with your health care provider.  Pap test.** / Every 3 years starting at age 30 years through age 65 or 70 years with 3 consecutive normal Pap tests. Testing can be stopped between 65 and 70 years with 3 consecutive normal Pap tests and no abnormal Pap or HPV tests in the past 10 years.  Fecal occult blood test (FOBT) of stool. / Every year beginning at age 50 years and continuing until age 75 years. You may not need to do this test if you get a colonoscopy every 10 years.  Flexible sigmoidoscopy or colonoscopy.** / Every 5 years for a flexible sigmoidoscopy or every 10 years for a colonoscopy beginning at age 50 years and continuing until age 75 years.  Hepatitis C blood test.** / For all people born from 1945 through 1965 and any individual with known risks for hepatitis C.  Osteoporosis screening.** / A one-time screening for women ages 65 years and over and women at risk for fractures or osteoporosis.  Skin self-exam. / Monthly.  Influenza vaccine. / Every year.  Tetanus, diphtheria, and acellular pertussis (Tdap/Td) vaccine.** / 1 dose of Td every 10 years.  Zoster vaccine.** / 1 dose for adults aged 60 years or older.  Pneumococcal 13-valent conjugate (PCV13) vaccine.** / Consult your health care provider.  Pneumococcal polysaccharide (PPSV23) vaccine.** / 1 dose for all adults aged 65 years and older. Screening  for abdominal aortic aneurysm (AAA)  by ultrasound is recommended for people who have history of high blood pressure or who are current or former smokers.  

## 2014-06-09 LAB — URINALYSIS, ROUTINE W REFLEX MICROSCOPIC
Bilirubin Urine: NEGATIVE
GLUCOSE, UA: NEGATIVE mg/dL
Hgb urine dipstick: NEGATIVE
Ketones, ur: NEGATIVE mg/dL
Leukocytes, UA: NEGATIVE
Nitrite: NEGATIVE
PH: 6 (ref 5.0–8.0)
Protein, ur: NEGATIVE mg/dL
Specific Gravity, Urine: <1.005 — ABNORMAL LOW (ref 1.005–1.030)
UROBILINOGEN UA: 0.2 mg/dL (ref 0.0–1.0)

## 2014-06-09 LAB — MICROALBUMIN / CREATININE URINE RATIO
Creatinine, Urine: 106.3 mg/dL
MICROALB/CREAT RATIO: 2.8 mg/g (ref 0.0–30.0)
Microalb, Ur: 0.3 mg/dL (ref <2.0)

## 2014-06-09 LAB — INSULIN, RANDOM: Insulin: 3.8 u[IU]/mL (ref 2.0–19.6)

## 2014-06-09 LAB — VITAMIN D 25 HYDROXY (VIT D DEFICIENCY, FRACTURES): VIT D 25 HYDROXY: 75 ng/mL (ref 30–100)

## 2014-06-22 LAB — COLOGUARD

## 2014-06-24 ENCOUNTER — Other Ambulatory Visit: Payer: Self-pay | Admitting: Internal Medicine

## 2014-06-24 MED ORDER — PRAVASTATIN SODIUM 40 MG PO TABS: ORAL_TABLET | ORAL | Status: DC

## 2014-07-05 ENCOUNTER — Encounter: Payer: Self-pay | Admitting: *Deleted

## 2014-07-14 ENCOUNTER — Ambulatory Visit (INDEPENDENT_AMBULATORY_CARE_PROVIDER_SITE_OTHER): Payer: Medicare HMO | Admitting: Internal Medicine

## 2014-07-14 VITALS — BP 122/70 | HR 60 | Temp 98.2°F | Resp 16 | Ht 63.75 in | Wt 127.0 lb

## 2014-07-14 DIAGNOSIS — I10 Essential (primary) hypertension: Secondary | ICD-10-CM

## 2014-07-14 NOTE — Progress Notes (Signed)
Assessment and Plan:   1. Essential hypertension -cont HCTZ -cont cozaar at 25 mg daily -monitor at home -if over 150/90 please take other half of tablet     HPI 74 y.o.female presents for 1 month follow up of blood pressure. Patient reports that they have been doing well.  female is taking their medication.  They are not having difficulty with their medications.  They report no adverse reactions.  She has been taking 1/2 tablet.   No CP, no shortness of breath, no blurred vision no light headedness.   Past Medical History  Diagnosis Date  . Hypertension   . Hyperlipidemia   . Unspecified vitamin D deficiency   . Other abnormal glucose   . Wrist fracture, right      Allergies  Allergen Reactions  . Prednisone Other (See Comments)    HIGH DOSE CAUSES INSOMNIA      Current Outpatient Prescriptions on File Prior to Visit  Medication Sig Dispense Refill  . aspirin 81 MG tablet Take 81 mg by mouth daily.    . cholecalciferol (VITAMIN D) 1000 UNITS tablet Take 1,000 Units by mouth daily. TAKES 5000 IU QD    . hydrochlorothiazide (HYDRODIURIL) 25 MG tablet Take 1/2 to 1 tablet daily for BP & fluid 90 tablet 99  . losartan (COZAAR) 50 MG tablet Take 1 tablet (50 mg total) by mouth daily. 90 tablet 0  . Magnesium 250 MG TABS Take by mouth daily.    . pravastatin (PRAVACHOL) 40 MG tablet TAKE 1 TABLET (40 MG TOTAL) BY MOUTH DAILY. 90 tablet 2  . sertraline (ZOLOFT) 50 MG tablet Please take 1/2 tablet daily for 1 week.  Please increase to 50 mg after a week if you have no side effect. 30 tablet 2   No current facility-administered medications on file prior to visit.    ROS: all negative except above.   Physical Exam: Filed Weights   07/14/14 1602  Weight: 127 lb (57.607 kg)   BP 122/70 mmHg  Pulse 60  Temp(Src) 98.2 F (36.8 C) (Temporal)  Resp 16  Ht 5' 3.75" (1.619 m)  Wt 127 lb (57.607 kg)  BMI 21.98 kg/m2 General Appearance: Well developed well nourished, non-toxic  appearing in no apparent distress. Eyes: PERRLA, EOMs, conjunctiva w/ no swelling or erythema or discharge Sinuses: No Frontal/maxillary tenderness ENT/Mouth: Ear canals clear without swelling or erythema.  TM's normal bilaterally with no retractions, bulging, or loss of landmarks.   Neck: Supple, thyroid normal, no notable JVD  Respiratory: Respiratory effort normal, Clear breath sounds anteriorly and posteriorly bilaterally without rales, rhonchi, wheezing or stridor. No retractions or accessory muscle usage. Cardio: RRR with no MRGs.   Abdomen: Soft, + BS.  Non tender, no guarding, rebound, hernias, masses.  Musculoskeletal: Full ROM, 5/5 strength, normal gait.  Skin: Warm, dry without rashes  Neuro: Awake and oriented X 3, Cranial nerves intact. Normal muscle tone, no cerebellar symptoms. Sensation intact.  Psych: normal affect, Insight and Judgment appropriate.     FORCUCCI, Loma Sousa, PA-C 4:37 PM [facility]

## 2014-09-23 ENCOUNTER — Ambulatory Visit (INDEPENDENT_AMBULATORY_CARE_PROVIDER_SITE_OTHER): Payer: Medicare HMO | Admitting: Internal Medicine

## 2014-09-23 ENCOUNTER — Encounter: Payer: Self-pay | Admitting: Internal Medicine

## 2014-09-23 VITALS — BP 116/60 | HR 62 | Temp 98.2°F | Resp 16 | Ht 63.25 in | Wt 126.0 lb

## 2014-09-23 DIAGNOSIS — E559 Vitamin D deficiency, unspecified: Secondary | ICD-10-CM

## 2014-09-23 DIAGNOSIS — E782 Mixed hyperlipidemia: Secondary | ICD-10-CM | POA: Diagnosis not present

## 2014-09-23 DIAGNOSIS — Z79899 Other long term (current) drug therapy: Secondary | ICD-10-CM

## 2014-09-23 DIAGNOSIS — Z23 Encounter for immunization: Secondary | ICD-10-CM

## 2014-09-23 DIAGNOSIS — R7303 Prediabetes: Secondary | ICD-10-CM

## 2014-09-23 DIAGNOSIS — I10 Essential (primary) hypertension: Secondary | ICD-10-CM

## 2014-09-23 LAB — CBC WITH DIFFERENTIAL/PLATELET
Basophils Absolute: 0 10*3/uL (ref 0.0–0.1)
Basophils Relative: 0 % (ref 0–1)
EOS ABS: 0.2 10*3/uL (ref 0.0–0.7)
Eosinophils Relative: 4 % (ref 0–5)
HCT: 35.1 % — ABNORMAL LOW (ref 36.0–46.0)
HEMOGLOBIN: 11.5 g/dL — AB (ref 12.0–15.0)
Lymphocytes Relative: 30 % (ref 12–46)
Lymphs Abs: 1.6 10*3/uL (ref 0.7–4.0)
MCH: 29.3 pg (ref 26.0–34.0)
MCHC: 32.8 g/dL (ref 30.0–36.0)
MCV: 89.3 fL (ref 78.0–100.0)
MONO ABS: 0.4 10*3/uL (ref 0.1–1.0)
MPV: 10.6 fL (ref 8.6–12.4)
Monocytes Relative: 7 % (ref 3–12)
NEUTROS PCT: 59 % (ref 43–77)
Neutro Abs: 3.1 10*3/uL (ref 1.7–7.7)
Platelets: 218 10*3/uL (ref 150–400)
RBC: 3.93 MIL/uL (ref 3.87–5.11)
RDW: 14 % (ref 11.5–15.5)
WBC: 5.2 10*3/uL (ref 4.0–10.5)

## 2014-09-23 LAB — HEPATIC FUNCTION PANEL
ALT: 20 U/L (ref 6–29)
AST: 25 U/L (ref 10–35)
Albumin: 4.2 g/dL (ref 3.6–5.1)
Alkaline Phosphatase: 68 U/L (ref 33–130)
BILIRUBIN INDIRECT: 0.3 mg/dL (ref 0.2–1.2)
Bilirubin, Direct: 0.1 mg/dL (ref <=0.2)
Total Bilirubin: 0.4 mg/dL (ref 0.2–1.2)
Total Protein: 6.7 g/dL (ref 6.1–8.1)

## 2014-09-23 LAB — LIPID PANEL
Cholesterol: 163 mg/dL (ref 125–200)
HDL: 69 mg/dL (ref >=46)
LDL Cholesterol: 82 mg/dL (ref <130)
TRIGLYCERIDES: 60 mg/dL (ref <150)
Total CHOL/HDL Ratio: 2.4 Ratio (ref <=5.0)
VLDL: 12 mg/dL (ref <30)

## 2014-09-23 LAB — BASIC METABOLIC PANEL WITH GFR
BUN: 26 mg/dL — ABNORMAL HIGH (ref 7–25)
CO2: 31 mmol/L (ref 20–31)
Calcium: 9.5 mg/dL (ref 8.6–10.4)
Chloride: 104 mmol/L (ref 98–110)
Creat: 1.38 mg/dL — ABNORMAL HIGH (ref 0.60–0.93)
GFR, EST NON AFRICAN AMERICAN: 38 mL/min — AB (ref >=60)
GFR, Est African American: 43 mL/min — ABNORMAL LOW (ref >=60)
Glucose, Bld: 93 mg/dL (ref 65–99)
Potassium: 4.5 mmol/L (ref 3.5–5.3)
SODIUM: 144 mmol/L (ref 135–146)

## 2014-09-23 LAB — TSH: TSH: 1.142 u[IU]/mL (ref 0.350–4.500)

## 2014-09-23 LAB — MAGNESIUM: Magnesium: 2.1 mg/dL (ref 1.5–2.5)

## 2014-09-23 MED ORDER — LOSARTAN POTASSIUM 50 MG PO TABS: 50.0000 mg | ORAL_TABLET | Freq: Every day | ORAL | Status: DC

## 2014-09-23 MED ORDER — HYDROCHLOROTHIAZIDE 25 MG PO TABS: ORAL_TABLET | ORAL | Status: DC

## 2014-09-23 MED ORDER — SERTRALINE HCL 50 MG PO TABS: 50.0000 mg | ORAL_TABLET | Freq: Every day | ORAL | Status: DC

## 2014-09-23 MED ORDER — PRAVASTATIN SODIUM 40 MG PO TABS: ORAL_TABLET | ORAL | Status: DC

## 2014-09-23 NOTE — Progress Notes (Signed)
Patient ID: Monique Barton, female   DOB: 1940/06/06, 74 y.o.   MRN: [id number]  Assessment and Plan:  Hypertension:  -Continue medication,  -monitor blood pressure at home.  -Continue DASH diet.   -Reminder to go to the ER if any CP, SOB, nausea, dizziness, severe HA, changes vision/speech, left arm numbness and tingling, and jaw pain.  Cholesterol: -Continue diet and exercise.  -Check cholesterol.   Pre-diabetes: -Continue diet and exercise.  -Check A1C  Vitamin D Def: -check level -continue medications.   Flu shot given today  Continue diet and meds as discussed. Further disposition pending results of labs.  HPI 74 y.o. female  presents for 3 month follow up with hypertension, hyperlipidemia, prediabetes and vitamin D.   Her blood pressure has been controlled at home, today their BP is BP: 116/60 mmHg.   She does not workout. She denies chest pain, shortness of breath, dizziness.     She is on cholesterol medication and denies myalgias. Her cholesterol is at goal. The cholesterol last visit was:   Lab Results  Component Value Date   CHOL 170 06/08/2014   HDL 65 06/08/2014   LDLCALC 88 06/08/2014   TRIG 84 06/08/2014   CHOLHDL 2.6 06/08/2014     She has been working on diet and exercise for prediabetes, and denies foot ulcerations, hyperglycemia, hypoglycemia , increased appetite, nausea, paresthesia of the feet, polydipsia, polyuria, visual disturbances, vomiting and weight loss. Last A1C in the office was:  Lab Results  Component Value Date   HGBA1C 6.1* 06/08/2014    Patient is on Vitamin D supplement.  Lab Results  Component Value Date   VD25OH 6 06/08/2014      Current Medications:  Current Outpatient Prescriptions on File Prior to Visit  Medication Sig Dispense Refill  . aspirin 81 MG tablet Take 81 mg by mouth daily.    . cholecalciferol (VITAMIN D) 1000 UNITS tablet Take 1,000 Units by mouth daily. TAKES 5000 IU QD    . hydrochlorothiazide  (HYDRODIURIL) 25 MG tablet Take 1/2 to 1 tablet daily for BP & fluid 90 tablet 99  . losartan (COZAAR) 50 MG tablet Take 1 tablet (50 mg total) by mouth daily. 90 tablet 0  . Magnesium 250 MG TABS Take by mouth daily.    . pravastatin (PRAVACHOL) 40 MG tablet TAKE 1 TABLET (40 MG TOTAL) BY MOUTH DAILY. 90 tablet 2  . sertraline (ZOLOFT) 50 MG tablet Please take 1/2 tablet daily for 1 week.  Please increase to 50 mg after a week if you have no side effect. (Patient not taking: Reported on 09/23/2014) 30 tablet 2   No current facility-administered medications on file prior to visit.    Medical History:  Past Medical History  Diagnosis Date  . Hypertension   . Hyperlipidemia   . Unspecified vitamin D deficiency   . Other abnormal glucose   . Wrist fracture, right     Allergies:  Allergies  Allergen Reactions  . Prednisone Other (See Comments)    HIGH DOSE CAUSES INSOMNIA     Review of Systems:  Review of Systems  Constitutional: Negative for fever, chills and malaise/fatigue.  HENT: Negative for congestion, ear pain and sore throat.   Respiratory: Negative for cough, shortness of breath and wheezing.   Cardiovascular: Negative for chest pain, palpitations and leg swelling.  Gastrointestinal: Negative for diarrhea, constipation, blood in stool and melena.  Genitourinary: Negative.   Skin: Negative.   Neurological: Negative for dizziness, loss  of consciousness and headaches.  Psychiatric/Behavioral: Negative for depression. The patient is not nervous/anxious and does not have insomnia.     Family history- Review and unchanged  Social history- Review and unchanged  Physical Exam: BP 116/60 mmHg  Pulse 62  Temp(Src) 98.2 F (36.8 C) (Temporal)  Resp 16  Ht 5' 3.25" (1.607 m)  Wt 126 lb (57.153 kg)  BMI 22.13 kg/m2 Wt Readings from Last 3 Encounters:  09/23/14 126 lb (57.153 kg)  07/14/14 127 lb (57.607 kg)  06/08/14 129 lb (58.514 kg)    General Appearance: Well  nourished well developed, in no apparent distress. Eyes: PERRLA, EOMs, conjunctiva no swelling or erythema ENT/Mouth: Ear canals normal without obstruction, swelling, erythma, discharge.  TMs normal bilaterally.  Oropharynx moist, clear, without exudate, or postoropharyngeal swelling. Neck: Supple, thyroid normal,no cervical adenopathy  Respiratory: Respiratory effort normal, Breath sounds clear A&P without rhonchi, wheeze, or rale.  No retractions, no accessory usage. Cardio: RRR with no MRGs. Brisk peripheral pulses without edema.  Abdomen: Soft, + BS,  Non tender, no guarding, rebound, hernias, masses. Musculoskeletal: Full ROM, 5/5 strength, Normal gait Skin: Warm, dry without rashes, lesions, ecchymosis.  Neuro: Awake and oriented X 3, Cranial nerves intact. Normal muscle tone, no cerebellar symptoms. Psych: Normal affect, Insight and Judgment appropriate.    Starlyn Skeans, PA-C 9:25 AM [facility]

## 2014-09-23 NOTE — Patient Instructions (Signed)
If you experience dizziness or lightheadedness please check you blood pressure.  If your blood pressure is less than 110/60 please hold your medication that day and drink plenty of water.     Recommendations For Diabetic/Prediabetic Patients:   -  Take medications as prescribed  -  Recommend Dr Fara Olden Fuhrman's book "The End of Diabetes "  And "The End of Dieting"- Can get at  www.[place].com and encourage also get the Audio CD book  - AVOID Animal products, ie. Meat - red/white, Poultry and Dairy/especially cheese - Exercise at least 5 times a week for 30 minutes or preferably daily.  - No Smoking - Drink less than 2 drinks a day.  - Monitor your feet for sores - Have yearly Eye Exams - Recommend annual Flu vaccine  - Recommend Pneumovax and Prevnar vaccines - Shingles Vaccine (Zostavax) if over 30 y.o.  Goals:   - BMI less than 24 - Fasting sugar less than 130 or less than 150 if tapering medicines to lose weight  - Systolic BP less than [id number]  - Diastolic BP less than 80 - Bad LDL Cholesterol less than 70 - Triglycerides less than 150  Diabetes is a very complicated disease...lets simplify it.  An easy way to look at it to understand the complications is if you think of the extra sugar floating in your blood stream as glass shards floating through your blood stream.    Diabetes affects your small vessels first: 1) The glass shards (sugar) scraps down the tiny blood vessels in your eyes and lead to diabetic retinopathy, the leading cause of blindness in the [place]. Diabetes is the leading cause of newly diagnosed adult (27 to 74 years of age) blindness in the [place].  2) The glass shards scratches down the tiny vessels of your legs leading to nerve damage called neuropathy and can lead to amputations of your feet. More than 60% of all non-traumatic amputations of lower limbs occur in people with diabetes.  3) Over time the small vessels in your brain are shredded and closed off,  individually this does not cause any problems but over a long period of time many of the small vessels being blocked can lead to Vascular Dementia.   4) Your kidney's are a filter system and have a "net" that keeps certain things in the body and lets bad things out. Sugar shreds this net and leads to kidney damage and eventually failure. Decreasing the sugar that is destroying the net and certain blood pressure medications can help stop or decrease progression of kidney disease. Diabetes was the primary cause of kidney failure in 44 percent of all new cases in 2011.  5) Diabetes also destroys the small vessels in your penis that lead to erectile dysfunction. Eventually the vessels are so damaged that you may not be responsive to cialis or viagra.   Diabetes and your large vessels: Your larger vessels consist of your coronary arteries in your heart and the carotid vessels to your brain. Diabetes or even increased sugars put you at 300% increased risk of heart attack and stroke and this is why.. The sugar scrapes down your large blood vessels and your body sees this as an internal injury and tries to repair itself. Just like you get a scab on your skin, your platelets will stick to the blood vessel wall trying to heal it. This is why we have diabetics on low dose aspirin daily, this prevents the platelets from sticking and can prevent  plaque formation. In addition, your body takes cholesterol and tries to shove it into the open wound. This is why we want your LDL, or bad cholesterol, below 70.   The combination of platelets and cholesterol over 5-10 years forms plaque that can break off and cause a heart attack or stroke.   PLEASE REMEMBER:  Diabetes is preventable! Up to 35 percent of complications and morbidities among individuals with type 2 diabetes can be prevented, delayed, or effectively treated and minimized with regular visits to a health professional, appropriate monitoring and medication, and a  healthy diet and lifestyle.

## 2014-09-24 LAB — HEMOGLOBIN A1C
Hgb A1c MFr Bld: 6 % — ABNORMAL HIGH (ref <5.7)
Mean Plasma Glucose: 126 mg/dL — ABNORMAL HIGH (ref <117)

## 2014-09-24 LAB — INSULIN, RANDOM: Insulin: 3.4 u[IU]/mL (ref 2.0–19.6)

## 2014-09-24 LAB — VITAMIN D 25 HYDROXY (VIT D DEFICIENCY, FRACTURES): VIT D 25 HYDROXY: 60 ng/mL (ref 30–100)

## 2014-12-28 ENCOUNTER — Encounter: Payer: Self-pay | Admitting: Internal Medicine

## 2014-12-28 ENCOUNTER — Ambulatory Visit (INDEPENDENT_AMBULATORY_CARE_PROVIDER_SITE_OTHER): Payer: Medicare HMO | Admitting: Internal Medicine

## 2014-12-28 VITALS — BP 130/66 | HR 60 | Temp 97.7°F | Resp 16 | Ht 63.75 in | Wt 124.0 lb

## 2014-12-28 DIAGNOSIS — R7303 Prediabetes: Secondary | ICD-10-CM

## 2014-12-28 DIAGNOSIS — Z79899 Other long term (current) drug therapy: Secondary | ICD-10-CM | POA: Diagnosis not present

## 2014-12-28 DIAGNOSIS — R7309 Other abnormal glucose: Secondary | ICD-10-CM | POA: Diagnosis not present

## 2014-12-28 DIAGNOSIS — I10 Essential (primary) hypertension: Secondary | ICD-10-CM | POA: Diagnosis not present

## 2014-12-28 DIAGNOSIS — E782 Mixed hyperlipidemia: Secondary | ICD-10-CM | POA: Diagnosis not present

## 2014-12-28 DIAGNOSIS — E559 Vitamin D deficiency, unspecified: Secondary | ICD-10-CM | POA: Diagnosis not present

## 2014-12-28 DIAGNOSIS — Z0000 Encounter for general adult medical examination without abnormal findings: Secondary | ICD-10-CM | POA: Insufficient documentation

## 2014-12-28 LAB — HEPATIC FUNCTION PANEL
ALBUMIN: 3.9 g/dL (ref 3.6–5.1)
ALK PHOS: 63 U/L (ref 33–130)
ALT: 20 U/L (ref 6–29)
AST: 29 U/L (ref 10–35)
BILIRUBIN INDIRECT: 0.3 mg/dL (ref 0.2–1.2)
BILIRUBIN TOTAL: 0.4 mg/dL (ref 0.2–1.2)
Bilirubin, Direct: 0.1 mg/dL (ref <=0.2)
TOTAL PROTEIN: 6.4 g/dL (ref 6.1–8.1)

## 2014-12-28 LAB — BASIC METABOLIC PANEL WITH GFR
BUN: 25 mg/dL (ref 7–25)
CALCIUM: 9.2 mg/dL (ref 8.6–10.4)
CO2: 28 mmol/L (ref 20–31)
Chloride: 102 mmol/L (ref 98–110)
Creat: 1.38 mg/dL — ABNORMAL HIGH (ref 0.60–0.93)
GFR, EST AFRICAN AMERICAN: 43 mL/min — AB (ref >=60)
GFR, EST NON AFRICAN AMERICAN: 38 mL/min — AB (ref >=60)
Glucose, Bld: 83 mg/dL (ref 65–99)
POTASSIUM: 4.5 mmol/L (ref 3.5–5.3)
Sodium: 140 mmol/L (ref 135–146)

## 2014-12-28 LAB — CBC WITH DIFFERENTIAL/PLATELET
BASOS ABS: 0 10*3/uL (ref 0.0–0.1)
BASOS PCT: 0 % (ref 0–1)
Eosinophils Absolute: 0.2 10*3/uL (ref 0.0–0.7)
Eosinophils Relative: 4 % (ref 0–5)
HCT: 35.2 % — ABNORMAL LOW (ref 36.0–46.0)
HEMOGLOBIN: 11.6 g/dL — AB (ref 12.0–15.0)
LYMPHS PCT: 27 % (ref 12–46)
Lymphs Abs: 1.4 10*3/uL (ref 0.7–4.0)
MCH: 29 pg (ref 26.0–34.0)
MCHC: 33 g/dL (ref 30.0–36.0)
MCV: 88 fL (ref 78.0–100.0)
MPV: 10.5 fL (ref 8.6–12.4)
Monocytes Absolute: 0.4 10*3/uL (ref 0.1–1.0)
Monocytes Relative: 8 % (ref 3–12)
NEUTROS ABS: 3.1 10*3/uL (ref 1.7–7.7)
NEUTROS PCT: 61 % (ref 43–77)
PLATELETS: 239 10*3/uL (ref 150–400)
RBC: 4 MIL/uL (ref 3.87–5.11)
RDW: 14.3 % (ref 11.5–15.5)
WBC: 5 10*3/uL (ref 4.0–10.5)

## 2014-12-28 LAB — LIPID PANEL
CHOLESTEROL: 164 mg/dL (ref 125–200)
HDL: 66 mg/dL (ref >=46)
LDL CALC: 84 mg/dL (ref <130)
TRIGLYCERIDES: 72 mg/dL (ref <150)
Total CHOL/HDL Ratio: 2.5 Ratio (ref <=5.0)
VLDL: 14 mg/dL (ref <30)

## 2014-12-28 LAB — MAGNESIUM: Magnesium: 2.1 mg/dL (ref 1.5–2.5)

## 2014-12-28 LAB — TSH: TSH: 1.091 u[IU]/mL (ref 0.350–4.500)

## 2014-12-28 NOTE — Progress Notes (Signed)
Patient ID: Monique Barton, female   DOB: 01/25/1940, 74 y.o.   MRN: [id number]   This very nice 74 y.o. [facility] presents for  follow up with Hypertension, Hyperlipidemia, Pre-Diabetes and Vitamin D Deficiency.    Patient is treated for HTN & BP has been controlled at home. Today's BP: 130/66 mmHg. Patient has had no complaints of any cardiac type chest pain, palpitations, dyspnea/orthopnea/PND, dizziness, claudication, or dependent edema.   Hyperlipidemia is controlled with diet & meds. Patient denies myalgias or other med SE's. Last Lipids were at goal with  Cholesterol 163; HDL 69; LDL 82; Triglycerides 60 on 09/23/2014.   Also, the patient has history of PreDiabetes with A1c 5.8% in 2013 and has had no symptoms of reactive hypoglycemia, diabetic polys, paresthesias or visual blurring.  Last A1c was  6.0% on 09/23/2014.   Further, the patient also has history of Vitamin D Deficiency and supplements vitamin D without any suspected side-effects. Last vitamin D was  60 on 09/23/2014.  Medication Sig  . aspirin 81 MG tablet Take 81 mg by mouth daily.  Marland Kitchen VITAMIN D 1000 UNITS  Take 1,000 Units by mouth daily. TAKES 5000 IU QD  . hctz 25 MG tablet Take 1/2 to 1 tablet daily for BP & fluid  . losartan  50 MG tablet Take 1 tablet (50 mg total) by mouth daily.  . Magnesium 250 MG TABS Take by mouth daily.  . Pravastatin 40 MG tablet TAKE 1 TABLET (40 MG TOTAL) BY MOUTH DAILY.  Marland Kitchen Sertraline 50 MG tablet Take 1 tablet (50 mg total) by mouth daily.   Allergies  Allergen Reactions  . Prednisone Other (See Comments)    HIGH DOSE CAUSES INSOMNIA   PMHx:   Past Medical History  Diagnosis Date  . Hypertension   . Hyperlipidemia   . Unspecified vitamin D deficiency   . Other abnormal glucose   . Wrist fracture, right    Immunization History  Administered Date(s) Administered  . Influenza Nasal 11/06/2011  . Influenza, High Dose Seasonal PF 10/06/2013, 09/23/2014  . Influenza-Unspecified 10/24/2012  .  Pneumococcal Conjugate-13 06/08/2014  . Pneumococcal-Unspecified 02/07/2006  . Tdap 04/04/2011  . Zoster 01/15/2009   Past Surgical History  Procedure Laterality Date  . Eye surgery Bilateral 2012   FHx:    Reviewed / unchanged  SHx:    Reviewed / unchanged  Systems Review:  Constitutional: Denies fever, chills, wt changes, headaches, insomnia, fatigue, night sweats, change in appetite. Eyes: Denies redness, blurred vision, diplopia, discharge, itchy, watery eyes.  ENT: Denies discharge, congestion, post nasal drip, epistaxis, sore throat, earache, hearing loss, dental pain, tinnitus, vertigo, sinus pain, snoring.  CV: Denies chest pain, palpitations, irregular heartbeat, syncope, dyspnea, diaphoresis, orthopnea, PND, claudication or edema. Respiratory: denies cough, dyspnea, DOE, pleurisy, hoarseness, laryngitis, wheezing.  Gastrointestinal: Denies dysphagia, odynophagia, heartburn, reflux, water brash, abdominal pain or cramps, nausea, vomiting, bloating, diarrhea, constipation, hematemesis, melena, hematochezia  or hemorrhoids. Genitourinary: Denies dysuria, frequency, urgency, nocturia, hesitancy, discharge, hematuria or flank pain. Musculoskeletal: Denies arthralgias, myalgias, stiffness, jt. swelling, pain, limping or strain/sprain.  Skin: Denies pruritus, rash, hives, warts, acne, eczema or change in skin lesion(s). Neuro: No weakness, tremor, incoordination, spasms, paresthesia or pain. Psychiatric: Denies confusion, memory loss or sensory loss. Endo: Denies change in weight, skin or hair change.  Heme/Lymph: No excessive bleeding, bruising or enlarged lymph nodes.  Physical Exam  BP 130/66 mmHg  Pulse 60  Temp(Src) 97.7 F (36.5 C)  Resp 16  Ht  5' 3.75" (1.619 m)  Wt 124 lb (56.246 kg)  BMI 21.46 kg/m2  Appears well nourished and in no distress. Eyes: PERRLA, EOMs, conjunctiva no swelling or erythema. Sinuses: No frontal/maxillary tenderness ENT/Mouth: EAC's clear,  TM's nl w/o erythema, bulging. Nares clear w/o erythema, swelling, exudates. Oropharynx clear without erythema or exudates. Oral hygiene is good. Tongue normal, non obstructing. Hearing intact.  Neck: Supple. Thyroid nl. Car 2+/2+ without bruits, nodes or JVD. Chest: Respirations nl with BS clear & equal w/o rales, rhonchi, wheezing or stridor.  Cor: Heart sounds normal w/ regular rate and rhythm without sig. murmurs, gallops, clicks, or rubs. Peripheral pulses normal and equal  without edema.  Abdomen: Soft & bowel sounds normal. Non-tender w/o guarding, rebound, hernias, masses, or organomegaly.  Lymphatics: Unremarkable.  Musculoskeletal: Full ROM all peripheral extremities, joint stability, 5/5 strength, and normal gait.  Skin: Warm, dry without exposed rashes, lesions or ecchymosis apparent.  Neuro: Cranial nerves intact, reflexes equal bilaterally. Sensory-motor testing grossly intact. Tendon reflexes grossly intact.  Pysch: Alert & oriented x 3.  Insight and judgement nl & appropriate. No ideations.  Assessment and Plan:.  1. Essential hypertension  - TSH  2. Hyperlipidemia  - Lipid panel - TSH  3. Prediabetes  - Hemoglobin A1c - Insulin, random  4. Vitamin D deficiency  - VITAMIN D 25 Hydroxy   5. Medication management  - CBC with Differential/Platelet - BASIC METABOLIC PANEL WITH GFR - Hepatic function panel - Magnesium   Recommended regular exercise, BP monitoring, weight control, and discussed med and SE's. Recommended labs to assess and monitor clinical status. Further disposition pending results of labs. Over 30 minutes of exam, counseling, chart review was performed

## 2014-12-28 NOTE — Patient Instructions (Signed)

## 2014-12-29 LAB — INSULIN, RANDOM: INSULIN: 3.2 u[IU]/mL (ref 2.0–19.6)

## 2014-12-29 LAB — VITAMIN D 25 HYDROXY (VIT D DEFICIENCY, FRACTURES): VIT D 25 HYDROXY: 55 ng/mL (ref 30–100)

## 2014-12-29 LAB — HEMOGLOBIN A1C
Hgb A1c MFr Bld: 6 % — ABNORMAL HIGH (ref <5.7)
MEAN PLASMA GLUCOSE: 126 mg/dL — AB (ref <117)

## 2015-01-13 ENCOUNTER — Other Ambulatory Visit: Payer: Self-pay | Admitting: *Deleted

## 2015-01-13 MED ORDER — LOSARTAN POTASSIUM 50 MG PO TABS: 50.0000 mg | ORAL_TABLET | Freq: Every day | ORAL | Status: DC

## 2015-01-18 ENCOUNTER — Other Ambulatory Visit: Payer: Self-pay | Admitting: *Deleted

## 2015-01-18 MED ORDER — LOSARTAN POTASSIUM 50 MG PO TABS: 50.0000 mg | ORAL_TABLET | Freq: Every day | ORAL | Status: DC

## 2015-03-29 ENCOUNTER — Encounter: Payer: Self-pay | Admitting: Physician Assistant

## 2015-03-29 ENCOUNTER — Ambulatory Visit (INDEPENDENT_AMBULATORY_CARE_PROVIDER_SITE_OTHER): Payer: Medicare HMO | Admitting: Physician Assistant

## 2015-03-29 VITALS — BP 130/74 | HR 62 | Temp 97.9°F | Resp 16 | Ht 63.75 in | Wt 122.4 lb

## 2015-03-29 DIAGNOSIS — Z9109 Other allergy status, other than to drugs and biological substances: Secondary | ICD-10-CM

## 2015-03-29 DIAGNOSIS — R7303 Prediabetes: Secondary | ICD-10-CM

## 2015-03-29 DIAGNOSIS — E559 Vitamin D deficiency, unspecified: Secondary | ICD-10-CM | POA: Diagnosis not present

## 2015-03-29 DIAGNOSIS — I10 Essential (primary) hypertension: Secondary | ICD-10-CM | POA: Diagnosis not present

## 2015-03-29 DIAGNOSIS — Z79899 Other long term (current) drug therapy: Secondary | ICD-10-CM | POA: Diagnosis not present

## 2015-03-29 DIAGNOSIS — Z91048 Other nonmedicinal substance allergy status: Secondary | ICD-10-CM | POA: Diagnosis not present

## 2015-03-29 DIAGNOSIS — R7309 Other abnormal glucose: Secondary | ICD-10-CM | POA: Diagnosis not present

## 2015-03-29 DIAGNOSIS — E782 Mixed hyperlipidemia: Secondary | ICD-10-CM | POA: Diagnosis not present

## 2015-03-29 DIAGNOSIS — Z1331 Encounter for screening for depression: Secondary | ICD-10-CM

## 2015-03-29 DIAGNOSIS — Z1389 Encounter for screening for other disorder: Secondary | ICD-10-CM | POA: Diagnosis not present

## 2015-03-29 LAB — HEPATIC FUNCTION PANEL
ALT: 15 U/L (ref 6–29)
AST: 21 U/L (ref 10–35)
Albumin: 4 g/dL (ref 3.6–5.1)
Alkaline Phosphatase: 68 U/L (ref 33–130)
BILIRUBIN DIRECT: 0.1 mg/dL (ref <=0.2)
Indirect Bilirubin: 0.3 mg/dL (ref 0.2–1.2)
TOTAL PROTEIN: 7 g/dL (ref 6.1–8.1)
Total Bilirubin: 0.4 mg/dL (ref 0.2–1.2)

## 2015-03-29 LAB — CBC WITH DIFFERENTIAL/PLATELET
BASOS PCT: 0 % (ref 0–1)
Basophils Absolute: 0 10*3/uL (ref 0.0–0.1)
Eosinophils Absolute: 0.1 10*3/uL (ref 0.0–0.7)
Eosinophils Relative: 1 % (ref 0–5)
HEMATOCRIT: 36.4 % (ref 36.0–46.0)
HEMOGLOBIN: 12.1 g/dL (ref 12.0–15.0)
LYMPHS ABS: 1.4 10*3/uL (ref 0.7–4.0)
LYMPHS PCT: 19 % (ref 12–46)
MCH: 30 pg (ref 26.0–34.0)
MCHC: 33.2 g/dL (ref 30.0–36.0)
MCV: 90.3 fL (ref 78.0–100.0)
MONO ABS: 0.5 10*3/uL (ref 0.1–1.0)
MONOS PCT: 7 % (ref 3–12)
MPV: 10.8 fL (ref 8.6–12.4)
NEUTROS ABS: 5.5 10*3/uL (ref 1.7–7.7)
NEUTROS PCT: 73 % (ref 43–77)
Platelets: 222 10*3/uL (ref 150–400)
RBC: 4.03 MIL/uL (ref 3.87–5.11)
RDW: 13.9 % (ref 11.5–15.5)
WBC: 7.6 10*3/uL (ref 4.0–10.5)

## 2015-03-29 LAB — BASIC METABOLIC PANEL WITH GFR
BUN: 22 mg/dL (ref 7–25)
CHLORIDE: 98 mmol/L (ref 98–110)
CO2: 31 mmol/L (ref 20–31)
CREATININE: 1.42 mg/dL — AB (ref 0.60–0.93)
Calcium: 9.7 mg/dL (ref 8.6–10.4)
GFR, Est African American: 42 mL/min — ABNORMAL LOW (ref >=60)
GFR, Est Non African American: 36 mL/min — ABNORMAL LOW (ref >=60)
GLUCOSE: 101 mg/dL — AB (ref 65–99)
Potassium: 4.7 mmol/L (ref 3.5–5.3)
SODIUM: 138 mmol/L (ref 135–146)

## 2015-03-29 LAB — MAGNESIUM: MAGNESIUM: 2.2 mg/dL (ref 1.5–2.5)

## 2015-03-29 LAB — LIPID PANEL
CHOL/HDL RATIO: 2.5 ratio (ref <=5.0)
Cholesterol: 160 mg/dL (ref 125–200)
HDL: 63 mg/dL (ref >=46)
LDL Cholesterol: 84 mg/dL (ref <130)
Triglycerides: 65 mg/dL (ref <150)
VLDL: 13 mg/dL (ref <30)

## 2015-03-29 LAB — TSH: TSH: 1.09 m[IU]/L

## 2015-03-29 MED ORDER — PRAVASTATIN SODIUM 40 MG PO TABS: ORAL_TABLET | ORAL | Status: DC

## 2015-03-29 MED ORDER — SERTRALINE HCL 50 MG PO TABS: 50.0000 mg | ORAL_TABLET | Freq: Every day | ORAL | Status: DC

## 2015-03-29 MED ORDER — LOSARTAN POTASSIUM 50 MG PO TABS: 50.0000 mg | ORAL_TABLET | Freq: Every day | ORAL | Status: DC

## 2015-03-29 MED ORDER — HYDROCHLOROTHIAZIDE 25 MG PO TABS: ORAL_TABLET | ORAL | Status: DC

## 2015-03-29 NOTE — Progress Notes (Signed)
Patient ID: Monique Barton, female   DOB: 1940/03/15, 75 y.o.   MRN: [id number]  Assessment and Plan:  Hypertension:  -Continue medication,  -monitor blood pressure at home.  -Continue DASH diet.   -Reminder to go to the ER if any CP, SOB, nausea, dizziness, severe HA, changes vision/speech, left arm numbness and tingling, and jaw pain.  Cholesterol: -Continue diet and exercise.  -Check cholesterol.   Pre-diabetes: -Continue diet and exercise.  -Check A1C  Vitamin D Def: -check level -continue medications.   Allergies Get on allergy pill, no fever, chills.   Depression screen negative   Continue diet and meds as discussed. Further disposition pending results of labs.  HPI 75 y.o. female  presents for 3 month follow up with hypertension, hyperlipidemia, prediabetes and vitamin D.   Her blood pressure has been controlled at home, today their BP is BP: 130/74 mmHg.   She does not workout. She denies chest pain, shortness of breath, dizziness.   She has had sinus congestion, rhinorrhea, watery eyes, dry throat, no cough, wheezing, SOB, fever, chills.    She is on cholesterol medication and denies myalgias. Her cholesterol is at goal. The cholesterol last visit was:   Lab Results  Component Value Date   CHOL 164 12/28/2014   HDL 66 12/28/2014   LDLCALC 84 12/28/2014   TRIG 72 12/28/2014   CHOLHDL 2.5 12/28/2014     She has been working on diet and exercise for prediabetes, and denies foot ulcerations, hyperglycemia, hypoglycemia , increased appetite, nausea, paresthesia of the feet, polydipsia, polyuria, visual disturbances, vomiting and weight loss. Last A1C in the office was:  Lab Results  Component Value Date   HGBA1C 6.0* 12/28/2014   Patient is on Vitamin D supplement.  Lab Results  Component Value Date   VD25OH 55 12/28/2014      Current Medications:  Current Outpatient Prescriptions on File Prior to Visit  Medication Sig Dispense Refill  . aspirin 81 MG  tablet Take 81 mg by mouth daily.    . cholecalciferol (VITAMIN D) 1000 UNITS tablet Take 1,000 Units by mouth daily. TAKES 2000 IU QD    . hydrochlorothiazide (HYDRODIURIL) 25 MG tablet Take 1/2 to 1 tablet daily for BP & fluid 90 tablet 99  . losartan (COZAAR) 50 MG tablet Take 1 tablet (50 mg total) by mouth daily. 90 tablet 1  . Magnesium 250 MG TABS Take by mouth daily.    . pravastatin (PRAVACHOL) 40 MG tablet TAKE 1 TABLET (40 MG TOTAL) BY MOUTH DAILY. 90 tablet 2  . sertraline (ZOLOFT) 50 MG tablet Take 1 tablet (50 mg total) by mouth daily. 90 tablet 3   No current facility-administered medications on file prior to visit.    Medical History:  Past Medical History  Diagnosis Date  . Hypertension   . Hyperlipidemia   . Unspecified vitamin D deficiency   . Other abnormal glucose   . Wrist fracture, right     Allergies:  Allergies  Allergen Reactions  . Prednisone Other (See Comments)    HIGH DOSE CAUSES INSOMNIA     Review of Systems:  Review of Systems  Constitutional: Negative for fever, chills and malaise/fatigue.  HENT: Positive for congestion. Negative for ear pain and sore throat.   Respiratory: Negative for cough, shortness of breath and wheezing.   Cardiovascular: Negative for chest pain, palpitations and leg swelling.  Gastrointestinal: Negative for diarrhea, constipation, blood in stool and melena.  Genitourinary: Negative.   Skin:  Negative.   Neurological: Negative for dizziness, loss of consciousness and headaches.  Psychiatric/Behavioral: Negative for depression. The patient is not nervous/anxious and does not have insomnia.     Family history- Review and unchanged  Social history- Review and unchanged  Physical Exam: BP 130/74 mmHg  Pulse 62  Temp(Src) 97.9 F (36.6 C) (Temporal)  Resp 16  Ht 5' 3.75" (1.619 m)  Wt 122 lb 6.4 oz (55.52 kg)  BMI 21.18 kg/m2  SpO2 96% Wt Readings from Last 3 Encounters:  03/29/15 122 lb 6.4 oz (55.52 kg)   12/28/14 124 lb (56.246 kg)  09/23/14 126 lb (57.153 kg)    General Appearance: Well nourished well developed, in no apparent distress. Eyes: PERRLA, EOMs, conjunctiva no swelling or erythema ENT/Mouth: Ear canals normal without obstruction, swelling, erythma, discharge.  TMs normal bilaterally.  Oropharynx moist, clear, without exudate, or postoropharyngeal swelling. Neck: Supple, thyroid normal,no cervical adenopathy  Respiratory: Respiratory effort normal, Breath sounds clear A&P without rhonchi, wheeze, or rale.  No retractions, no accessory usage. Cardio: RRR with no MRGs. Brisk peripheral pulses without edema.  Abdomen: Soft, + BS,  Non tender, no guarding, rebound, hernias, masses. Musculoskeletal: Full ROM, 5/5 strength, Normal gait Skin: Warm, dry without rashes, lesions, ecchymosis.  Neuro: Awake and oriented X 3, Cranial nerves intact. Normal muscle tone, no cerebellar symptoms. Psych: Normal affect, Insight and Judgment appropriate.    Vicie Mutters, PA-C 10:41 AM [facility]

## 2015-03-29 NOTE — Patient Instructions (Addendum)
Please pick one of the over the counter allergy medications below and take it once daily for allergies.  Claritin or loratadine cheapest but likely the weakest  Zyrtec or certizine at night because it can make you sleepy The strongest is allegra or fexafinadine  Cheapest at [employer], [employer], [employer]  Recommendations For Diabetic/Prediabetic Patients:   -  Take medications as prescribed  -  Recommend Dr Fara Olden Fuhrman's book "The End of Diabetes "  And "The End of Dieting"- Can get at  www.[place].com and encourage also get the Audio CD book  - AVOID Animal products, ie. Meat - red/white, Poultry and Dairy/especially cheese - Exercise at least 5 times a week for 30 minutes or preferably daily.  - No Smoking - Drink less than 2 drinks a day.  - Monitor your feet for sores - Have yearly Eye Exams - Recommend annual Flu vaccine  - Recommend Pneumovax and Prevnar vaccines - Shingles Vaccine (Zostavax) if over 51 y.o.  Goals:   - BMI less than 24 - Fasting sugar less than 130 or less than 150 if tapering medicines to lose weight  - Systolic BP less than [id number]  - Diastolic BP less than 80 - Bad LDL Cholesterol less than 70 - Triglycerides less than 150

## 2015-03-30 LAB — HEMOGLOBIN A1C
Hgb A1c MFr Bld: 5.8 % — ABNORMAL HIGH
Mean Plasma Glucose: 120 mg/dL — ABNORMAL HIGH

## 2015-03-30 LAB — VITAMIN D 25 HYDROXY (VIT D DEFICIENCY, FRACTURES): Vit D, 25-Hydroxy: 59 ng/mL (ref 30–100)

## 2015-06-08 ENCOUNTER — Encounter: Payer: Self-pay | Admitting: Internal Medicine

## 2015-06-30 ENCOUNTER — Ambulatory Visit (INDEPENDENT_AMBULATORY_CARE_PROVIDER_SITE_OTHER): Payer: Medicare HMO | Admitting: Internal Medicine

## 2015-06-30 ENCOUNTER — Encounter: Payer: Self-pay | Admitting: Internal Medicine

## 2015-06-30 VITALS — BP 136/62 | HR 60 | Temp 97.8°F | Resp 14 | Ht 63.0 in | Wt 122.0 lb

## 2015-06-30 DIAGNOSIS — E559 Vitamin D deficiency, unspecified: Secondary | ICD-10-CM | POA: Diagnosis not present

## 2015-06-30 DIAGNOSIS — R7303 Prediabetes: Secondary | ICD-10-CM | POA: Diagnosis not present

## 2015-06-30 DIAGNOSIS — I10 Essential (primary) hypertension: Secondary | ICD-10-CM

## 2015-06-30 DIAGNOSIS — E782 Mixed hyperlipidemia: Secondary | ICD-10-CM

## 2015-06-30 DIAGNOSIS — Z0000 Encounter for general adult medical examination without abnormal findings: Secondary | ICD-10-CM | POA: Diagnosis not present

## 2015-06-30 DIAGNOSIS — R6889 Other general symptoms and signs: Secondary | ICD-10-CM | POA: Diagnosis not present

## 2015-06-30 DIAGNOSIS — Z0001 Encounter for general adult medical examination with abnormal findings: Secondary | ICD-10-CM | POA: Diagnosis not present

## 2015-06-30 DIAGNOSIS — Z79899 Other long term (current) drug therapy: Secondary | ICD-10-CM

## 2015-06-30 DIAGNOSIS — R7309 Other abnormal glucose: Secondary | ICD-10-CM | POA: Diagnosis not present

## 2015-06-30 DIAGNOSIS — Z1212 Encounter for screening for malignant neoplasm of rectum: Secondary | ICD-10-CM

## 2015-06-30 LAB — CBC WITH DIFFERENTIAL/PLATELET
BASOS ABS: 51 {cells}/uL (ref 0–200)
Basophils Relative: 1 %
EOS ABS: 153 {cells}/uL (ref 15–500)
EOS PCT: 3 %
HEMATOCRIT: 36.3 % (ref 35.0–45.0)
HEMOGLOBIN: 11.5 g/dL — AB (ref 11.7–15.5)
LYMPHS ABS: 1836 {cells}/uL (ref 850–3900)
Lymphocytes Relative: 36 %
MCH: 28.6 pg (ref 27.0–33.0)
MCHC: 31.7 g/dL — AB (ref 32.0–36.0)
MCV: 90.3 fL (ref 80.0–100.0)
MONO ABS: 408 {cells}/uL (ref 200–950)
MPV: 10.6 fL (ref 7.5–12.5)
Monocytes Relative: 8 %
NEUTROS ABS: 2652 {cells}/uL (ref 1500–7800)
NEUTROS PCT: 52 %
Platelets: 209 10*3/uL (ref 140–400)
RBC: 4.02 MIL/uL (ref 3.80–5.10)
RDW: 14.2 % (ref 11.0–15.0)
WBC: 5.1 10*3/uL (ref 3.8–10.8)

## 2015-06-30 LAB — TSH: TSH: 0.89 mIU/L

## 2015-06-30 NOTE — Progress Notes (Signed)
Complete Physical  Assessment and Plan:   1. Essential hypertension -well controlled -cont meds - Urinalysis, Routine w reflex microscopic (not at [facility]) - Microalbumin / creatinine urine ratio - EKG 12-Lead - TSH  2. Hyperlipidemia -cut pravastatin in half - Lipid panel  3. Prediabetes  - Hemoglobin A1c - Insulin, random  4. Vitamin D deficiency -cont supplement - VITAMIN D 25 Hydroxy (Vit-D Deficiency, Fractures)  5. Medication management -cont biyearly labs  6. Encounter for general adult medical examination with abnormal findings  - CBC with Differential/Platelet - BASIC METABOLIC PANEL WITH GFR - Hepatic function panel - Magnesium  7. Screening for rectal cancer  - POC Hemoccult Bld/Stl (3-Cd Home Screen); Future  Recommended to get [facility]ogram scheduled with [facility].    Discussed med's effects and SE's. Screening labs and tests as requested with regular follow-up as recommended.  HPI  75 y.o. female  presents for a complete physical.  Her blood pressure has been controlled at home, today their BP is BP: 136/62 mmHg.  She does workout. She denies chest pain, shortness of breath, dizziness.  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  She is not doing a lot of yard work as well.  She does go dancing.    She is on cholesterol medication and denies myalgias. Her cholesterol is at goal. The cholesterol last visit was:  Lab Results  Component Value Date   CHOL 160 03/29/2015   HDL 63 03/29/2015   LDLCALC 84 03/29/2015   TRIG 65 03/29/2015   CHOLHDL 2.5 03/29/2015  .  She has been working on diet and exercise for prediabetes, she is on bASA, she is on ACE/ARB and denies foot ulcerations, hyperglycemia, hypoglycemia , increased appetite, nausea, paresthesia of the feet, polydipsia, polyuria, visual disturbances, vomiting and weight loss. Last A1C in the office was:  Lab Results  Component Value Date   HGBA1C 5.8*  03/29/2015    Patient is on Vitamin D supplement.   Lab Results  Component Value Date   VD25OH 43 03/29/2015     She does note that she is having some aching in her joints, particularly her right knee.  She notes that once she gets up and moves around it feels much better. She does not like to take oral medications for it.  She has never tried any topical OTCs.   Current Medications:  Current Outpatient Prescriptions on File Prior to Visit  Medication Sig Dispense Refill  . aspirin 81 MG tablet Take 81 mg by mouth daily.    . cholecalciferol (VITAMIN D) 1000 UNITS tablet Take 1,000 Units by mouth daily. TAKES 2000 IU QD    . hydrochlorothiazide (HYDRODIURIL) 25 MG tablet Take 1/2 to 1 tablet daily for BP & fluid 90 tablet 99  . losartan (COZAAR) 50 MG tablet Take 1 tablet (50 mg total) by mouth daily. 90 tablet 1  . Magnesium 250 MG TABS Take by mouth daily.    . pravastatin (PRAVACHOL) 40 MG tablet TAKE 1 TABLET (40 MG TOTAL) BY MOUTH DAILY. 90 tablet 2  . sertraline (ZOLOFT) 50 MG tablet Take 1 tablet (50 mg total) by mouth daily. 90 tablet 3   No current facility-administered medications on file prior to visit.    Health Maintenance:   Immunization History  Administered Date(s) Administered  . Influenza Nasal 11/06/2011  . Influenza, High Dose Seasonal PF 10/06/2013, 09/23/2014  . Influenza-Unspecified 10/24/2012  . Pneumococcal Conjugate-13 06/08/2014  .  Pneumococcal-Unspecified 02/07/2006  . Tdap 04/04/2011  . Zoster 01/15/2009    Tetanus:2013 Pneumovax:2008 Prevnar: 2016 Flu vaccine:2016 Zostavax: 2011 MGM: Due DEXA:2015, due next year Colonoscopy: 1999, 2016 Last Dental Exam:Does not currently see a dentist. Last Eye Exam: Dr. Laurette Schimke  Patient Care Team: Unk Pinto, MD as PCP - General (Internal Medicine)  Allergies:  Allergies  Allergen Reactions  . Prednisone Other (See Comments)    HIGH DOSE CAUSES INSOMNIA    Medical History:  Past Medical  History  Diagnosis Date  . Hypertension   . Hyperlipidemia   . Unspecified vitamin D deficiency   . Other abnormal glucose   . Wrist fracture, right     Surgical History:  Past Surgical History  Procedure Laterality Date  . Eye surgery Bilateral 2012    Family History:  Family History  Problem Relation Age of Onset  . Diabetes Mother   . Heart disease Mother   . Hyperlipidemia Mother   . Hypertension Mother   . Cancer Father     PROSTATE  . Diabetes Brother   . Diabetes Brother   . Cancer Brother     LUNG  . Cancer Brother     BONE    Social History:  Social History  Substance Use Topics  . Smoking status: Passive Smoke Exposure - Never Smoker  . Smokeless tobacco: None     Comment: Second hand smoke exposure  . Alcohol Use: No    Review of Systems: Review of Systems  Constitutional: Negative for fever, chills and malaise/fatigue.  HENT: Negative for congestion, ear pain and sore throat.   Eyes: Negative.   Respiratory: Negative for cough, shortness of breath and wheezing.   Cardiovascular: Negative for chest pain, palpitations and leg swelling.  Gastrointestinal: Negative for heartburn, abdominal pain, diarrhea, constipation, blood in stool and melena.  Genitourinary: Negative.   Skin: Negative.   Neurological: Negative for dizziness, sensory change, loss of consciousness and headaches.  Psychiatric/Behavioral: Negative for depression. The patient is not nervous/anxious and does not have insomnia.     Physical Exam: Estimated body mass index is 21.62 kg/(m^2) as calculated from the following:   Height as of this encounter: 5\' 3"  (1.6 m).   Weight as of this encounter: 122 lb (55.339 kg). BP 136/62 mmHg  Pulse 60  Temp(Src) 97.8 F (36.6 C) (Temporal)  Resp 14  Ht 5\' 3"  (1.6 m)  Wt 122 lb (55.339 kg)  BMI 21.62 kg/m2  General Appearance: Well nourished well developed, in no apparent distress.  Eyes: PERRLA, EOMs, conjunctiva no swelling or  erythema ENT/Mouth: Ear canals normal without obstruction, swelling, erythema, or discharge.  TMs normal bilaterally with no erythema, bulging, retraction, or loss of landmark.  Oropharynx moist and clear with no exudate, erythema, or swelling.   Neck: Supple, thyroid normal. No bruits.  No cervical adenopathy Respiratory: Respiratory effort normal, Breath sounds clear A&P without wheeze, rhonchi, rales.   Cardio: RRR without murmurs, rubs or gallops. Brisk peripheral pulses without edema.  Chest: symmetric, with normal excursions Breasts: Symmetric, without lumps, nipple discharge, retractions. Breasts fibrocystic Abdomen: Soft, nontender, no guarding, rebound, hernias, masses, or organomegaly.  Lymphatics: Non tender without lymphadenopathy.   Musculoskeletal: Full ROM all peripheral extremities,5/5 strength, and normal gait.  Skin: Warm, dry without rashes, lesions, ecchymosis. Neuro: Awake and oriented X 3, Cranial nerves intact, reflexes equal bilaterally. Normal muscle tone, no cerebellar symptoms. Sensation intact.  Psych:  normal affect, Insight and Judgment appropriate.   EKG: WNL no  changes.  Over 40 minutes of exam, counseling, chart review and critical decision making was performed  Loma Sousa Forcucci 10:12 AM [facility]

## 2015-07-01 LAB — BASIC METABOLIC PANEL WITH GFR
BUN: 25 mg/dL (ref 7–25)
CALCIUM: 9.3 mg/dL (ref 8.6–10.4)
CHLORIDE: 101 mmol/L (ref 98–110)
CO2: 25 mmol/L (ref 20–31)
CREATININE: 1.46 mg/dL — AB (ref 0.60–0.93)
GFR, EST AFRICAN AMERICAN: 40 mL/min — AB (ref >=60)
GFR, Est Non African American: 35 mL/min — ABNORMAL LOW (ref >=60)
GLUCOSE: 92 mg/dL (ref 65–99)
Potassium: 3.9 mmol/L (ref 3.5–5.3)
Sodium: 139 mmol/L (ref 135–146)

## 2015-07-01 LAB — HEPATIC FUNCTION PANEL
ALBUMIN: 4.1 g/dL (ref 3.6–5.1)
ALK PHOS: 68 U/L (ref 33–130)
ALT: 15 U/L (ref 6–29)
AST: 22 U/L (ref 10–35)
BILIRUBIN INDIRECT: 0.4 mg/dL (ref 0.2–1.2)
BILIRUBIN TOTAL: 0.5 mg/dL (ref 0.2–1.2)
Bilirubin, Direct: 0.1 mg/dL (ref <=0.2)
Total Protein: 6.8 g/dL (ref 6.1–8.1)

## 2015-07-01 LAB — URINALYSIS, ROUTINE W REFLEX MICROSCOPIC
Bilirubin Urine: NEGATIVE
Glucose, UA: NEGATIVE
HGB URINE DIPSTICK: NEGATIVE
KETONES UR: NEGATIVE
LEUKOCYTES UA: NEGATIVE
NITRITE: NEGATIVE
PH: 7.5 (ref 5.0–8.0)
PROTEIN: NEGATIVE
Specific Gravity, Urine: 1.009 (ref 1.001–1.035)

## 2015-07-01 LAB — LIPID PANEL
CHOL/HDL RATIO: 2.2 ratio (ref <=5.0)
CHOLESTEROL: 165 mg/dL (ref 125–200)
HDL: 74 mg/dL (ref >=46)
LDL Cholesterol: 78 mg/dL (ref <130)
Triglycerides: 66 mg/dL (ref <150)
VLDL: 13 mg/dL (ref <30)

## 2015-07-01 LAB — MICROALBUMIN / CREATININE URINE RATIO
Creatinine, Urine: 46 mg/dL (ref 20–320)
MICROALB UR: 0.2 mg/dL
MICROALB/CREAT RATIO: 4 ug/mg{creat} (ref <30)

## 2015-07-01 LAB — INSULIN, RANDOM: INSULIN: 2.4 u[IU]/mL (ref 2.0–19.6)

## 2015-07-01 LAB — HEMOGLOBIN A1C
HEMOGLOBIN A1C: 5.8 % — AB (ref <5.7)
MEAN PLASMA GLUCOSE: 120 mg/dL

## 2015-07-01 LAB — VITAMIN D 25 HYDROXY (VIT D DEFICIENCY, FRACTURES): Vit D, 25-Hydroxy: 78 ng/mL (ref 30–100)

## 2015-07-01 LAB — MAGNESIUM: Magnesium: 2.2 mg/dL (ref 1.5–2.5)

## 2015-10-21 DIAGNOSIS — R69 Illness, unspecified: Secondary | ICD-10-CM | POA: Diagnosis not present

## 2016-01-12 ENCOUNTER — Encounter: Payer: Self-pay | Admitting: Internal Medicine

## 2016-01-12 ENCOUNTER — Ambulatory Visit (INDEPENDENT_AMBULATORY_CARE_PROVIDER_SITE_OTHER): Payer: Medicare HMO | Admitting: Internal Medicine

## 2016-01-12 ENCOUNTER — Other Ambulatory Visit: Payer: Self-pay | Admitting: *Deleted

## 2016-01-12 VITALS — BP 122/76 | HR 64 | Temp 97.2°F | Resp 16 | Ht 63.0 in | Wt 125.8 lb

## 2016-01-12 DIAGNOSIS — I10 Essential (primary) hypertension: Secondary | ICD-10-CM

## 2016-01-12 DIAGNOSIS — Z79899 Other long term (current) drug therapy: Secondary | ICD-10-CM

## 2016-01-12 DIAGNOSIS — E782 Mixed hyperlipidemia: Secondary | ICD-10-CM | POA: Diagnosis not present

## 2016-01-12 DIAGNOSIS — R7303 Prediabetes: Secondary | ICD-10-CM | POA: Diagnosis not present

## 2016-01-12 DIAGNOSIS — E559 Vitamin D deficiency, unspecified: Secondary | ICD-10-CM | POA: Diagnosis not present

## 2016-01-12 LAB — CBC WITH DIFFERENTIAL/PLATELET
BASOS PCT: 1 %
Basophils Absolute: 51 cells/uL (ref 0–200)
EOS ABS: 204 {cells}/uL (ref 15–500)
Eosinophils Relative: 4 %
HEMATOCRIT: 36.9 % (ref 35.0–45.0)
HEMOGLOBIN: 11.7 g/dL (ref 11.7–15.5)
LYMPHS ABS: 1428 {cells}/uL (ref 850–3900)
Lymphocytes Relative: 28 %
MCH: 29.1 pg (ref 27.0–33.0)
MCHC: 31.7 g/dL — ABNORMAL LOW (ref 32.0–36.0)
MCV: 91.8 fL (ref 80.0–100.0)
MONO ABS: 408 {cells}/uL (ref 200–950)
MPV: 10.7 fL (ref 7.5–12.5)
Monocytes Relative: 8 %
NEUTROS ABS: 3009 {cells}/uL (ref 1500–7800)
Neutrophils Relative %: 59 %
PLATELETS: 226 10*3/uL (ref 140–400)
RBC: 4.02 MIL/uL (ref 3.80–5.10)
RDW: 13.8 % (ref 11.0–15.0)
WBC: 5.1 10*3/uL (ref 3.8–10.8)

## 2016-01-12 LAB — HEPATIC FUNCTION PANEL
ALBUMIN: 4 g/dL (ref 3.6–5.1)
ALT: 21 U/L (ref 6–29)
AST: 27 U/L (ref 10–35)
Alkaline Phosphatase: 56 U/L (ref 33–130)
Bilirubin, Direct: 0.1 mg/dL (ref <=0.2)
Indirect Bilirubin: 0.4 mg/dL (ref 0.2–1.2)
TOTAL PROTEIN: 6.6 g/dL (ref 6.1–8.1)
Total Bilirubin: 0.5 mg/dL (ref 0.2–1.2)

## 2016-01-12 LAB — BASIC METABOLIC PANEL WITH GFR
BUN: 21 mg/dL (ref 7–25)
CHLORIDE: 102 mmol/L (ref 98–110)
CO2: 30 mmol/L (ref 20–31)
CREATININE: 1.27 mg/dL — AB (ref 0.60–0.93)
Calcium: 9.2 mg/dL (ref 8.6–10.4)
GFR, Est African American: 48 mL/min — ABNORMAL LOW (ref >=60)
GFR, Est Non African American: 41 mL/min — ABNORMAL LOW (ref >=60)
GLUCOSE: 91 mg/dL (ref 65–99)
Potassium: 4.1 mmol/L (ref 3.5–5.3)
Sodium: 141 mmol/L (ref 135–146)

## 2016-01-12 LAB — MAGNESIUM: MAGNESIUM: 2 mg/dL (ref 1.5–2.5)

## 2016-01-12 LAB — LIPID PANEL
Cholesterol: 188 mg/dL (ref <200)
HDL: 72 mg/dL (ref >50)
LDL Cholesterol: 98 mg/dL (ref <100)
Total CHOL/HDL Ratio: 2.6 Ratio (ref <5.0)
Triglycerides: 90 mg/dL (ref <150)
VLDL: 18 mg/dL (ref <30)

## 2016-01-12 MED ORDER — SERTRALINE HCL 50 MG PO TABS: 50.0000 mg | ORAL_TABLET | Freq: Every day | ORAL | 3 refills | Status: DC

## 2016-01-12 MED ORDER — PRAVASTATIN SODIUM 40 MG PO TABS: ORAL_TABLET | ORAL | 3 refills | Status: DC

## 2016-01-12 MED ORDER — HYDROCHLOROTHIAZIDE 25 MG PO TABS: ORAL_TABLET | ORAL | 3 refills | Status: DC

## 2016-01-12 MED ORDER — LOSARTAN POTASSIUM 50 MG PO TABS: 50.0000 mg | ORAL_TABLET | Freq: Every day | ORAL | 3 refills | Status: DC

## 2016-01-12 NOTE — Progress Notes (Signed)
[facility] Unk Pinto, M.D.        Uvaldo Bristle. Silverio Lay, P.A.-C       Starlyn Skeans, P.A.-C  [facility]                [address] [place], [place] [address] Telephone [phone] Telefax [phone] ______________________________________________________________________     This very nice 75 y.o.female presents for 3 month follow up with Hypertension, Hyperlipidemia, Pre-Diabetes and Vitamin D Deficiency.      Patient is treated for HTN & BP has been controlled at home. Today's  . Patient has had no complaints of any cardiac type chest pain, palpitations, dyspnea/orthopnea/PND, dizziness, claudication, or dependent edema.     Hyperlipidemia is controlled with diet & meds. Patient denies myalgias or other med SE's. Last Lipids were at goal: Lab Results  Component Value Date   CHOL 165 06/30/2015   HDL 74 06/30/2015   LDLCALC 78 06/30/2015   TRIG 66 06/30/2015   CHOLHDL 2.2 06/30/2015      Also, the patient has history of PreDiabetes and has had no symptoms of reactive hypoglycemia, diabetic polys, paresthesias or visual blurring.  Last A1c was not at goal: Lab Results  Component Value Date   HGBA1C 5.8 (H) 06/30/2015      Further, the patient also has history of Vitamin D Deficiency and supplements vitamin D without any suspected side-effects. Last vitamin D was  At goal: Lab Results  Component Value Date   VD25OH 78 06/30/2015   Current Outpatient Prescriptions on File Prior to Visit  Medication Sig  . aspirin 81 MG tablet Take 81 mg by mouth daily.  . cholecalciferol (VITAMIN D) 1000 UNITS tablet Take 1,000 Units by mouth daily. TAKES 2000 IU QD  . hydrochlorothiazide (HYDRODIURIL) 25 MG tablet Take 1/2 to 1 tablet daily for BP & fluid  . losartan (COZAAR) 50 MG tablet Take 1 tablet (50 mg total) by mouth daily.  . Magnesium 250 MG TABS Take by mouth daily.  . pravastatin  (PRAVACHOL) 40 MG tablet TAKE 1 TABLET (40 MG TOTAL) BY MOUTH DAILY.  Marland Kitchen sertraline (ZOLOFT) 50 MG tablet Take 1 tablet (50 mg total) by mouth daily.   No current facility-administered medications on file prior to visit.    Allergies  Allergen Reactions  . Prednisone Other (See Comments)    HIGH DOSE CAUSES INSOMNIA   PMHx:   Past Medical History:  Diagnosis Date  . Hyperlipidemia   . Hypertension   . Other abnormal glucose   . Unspecified vitamin D deficiency   . Wrist fracture, right    Immunization History  Administered Date(s) Administered  . Influenza Nasal 11/06/2011  . Influenza, High Dose Seasonal PF 10/06/2013, 09/23/2014  . Influenza-Unspecified 10/24/2012  . Pneumococcal Conjugate-13 06/08/2014  . Pneumococcal-Unspecified 02/07/2006  . Tdap 04/04/2011  . Zoster 01/15/2009   Past Surgical History:  Procedure Laterality Date  . EYE SURGERY Bilateral 2012   FHx:    Reviewed / unchanged  SHx:    Reviewed / unchanged  Systems Review:  Constitutional: Denies fever, chills, wt changes, headaches, insomnia, fatigue, night sweats, change in appetite. Eyes: Denies redness, blurred vision, diplopia, discharge, itchy, watery eyes.  ENT: Denies discharge, congestion, post nasal drip, epistaxis, sore throat, earache, hearing loss, dental pain, tinnitus, vertigo, sinus pain, snoring.  CV: Denies chest pain, palpitations, irregular heartbeat, syncope, dyspnea,  diaphoresis, orthopnea, PND, claudication or edema. Respiratory: denies cough, dyspnea, DOE, pleurisy, hoarseness, laryngitis, wheezing.  Gastrointestinal: Denies dysphagia, odynophagia, heartburn, reflux, water brash, abdominal pain or cramps, nausea, vomiting, bloating, diarrhea, constipation, hematemesis, melena, hematochezia  or hemorrhoids. Genitourinary: Denies dysuria, frequency, urgency, nocturia, hesitancy, discharge, hematuria or flank pain. Musculoskeletal: Denies arthralgias, myalgias, stiffness, jt. swelling,  pain, limping or strain/sprain.  Skin: Denies pruritus, rash, hives, warts, acne, eczema or change in skin lesion(s). Neuro: No weakness, tremor, incoordination, spasms, paresthesia or pain. Psychiatric: Denies confusion, memory loss or sensory loss. Endo: Denies change in weight, skin or hair change.  Heme/Lymph: No excessive bleeding, bruising or enlarged lymph nodes.  Physical Exam  There were no vitals taken for this visit.  Appears well nourished and in no distress.  Eyes: PERRLA, EOMs, conjunctiva no swelling or erythema. Sinuses: No frontal/maxillary tenderness ENT/Mouth: EAC's clear, TM's nl w/o erythema, bulging. Nares clear w/o erythema, swelling, exudates. Oropharynx clear without erythema or exudates. Oral hygiene is good. Tongue normal, non obstructing. Hearing intact.  Neck: Supple. Thyroid nl. Car 2+/2+ without bruits, nodes or JVD. Chest: Respirations nl with BS clear & equal w/o rales, rhonchi, wheezing or stridor.  Cor: Heart sounds normal w/ regular rate and rhythm without sig. murmurs, gallops, clicks, or rubs. Peripheral pulses normal and equal  without edema.  Abdomen: Soft & bowel sounds normal. Non-tender w/o guarding, rebound, hernias, masses, or organomegaly.  Lymphatics: Unremarkable.  Musculoskeletal: Full ROM all peripheral extremities, joint stability, 5/5 strength, and normal gait.  Skin: Warm, dry without exposed rashes, lesions or ecchymosis apparent.  Neuro: Cranial nerves intact, reflexes equal bilaterally. Sensory-motor testing grossly intact. Tendon reflexes grossly intact.  Pysch: Alert & oriented x 3.  Insight and judgement nl & appropriate. No ideations.  Assessment and Plan:  1. Essential hypertension  - Continue medication, monitor blood pressure at home.  - Continue DASH diet. Reminder to go to the ER if any CP,  SOB, nausea, dizziness, severe HA, changes vision/speech,  left arm numbness and tingling and jaw pain. Encouraged exercise.  -  CBC with Differential/Platelet - BASIC METABOLIC PANEL WITH GFR - TSH  2. Hyperlipidemia  - Continue diet/meds, exercise,& lifestyle modifications.  - Continue monitor periodic cholesterol/liver & renal functions  - Hepatic function panel - Lipid panel - TSH  3. Prediabetes  - Continue diet, exercise, lifestyle modifications.  - Monitor appropriate labs. - Hemoglobin A1c - Insulin, random  4. Vitamin D deficiency  - Continue supplementation. - VITAMIN D 25 Hydroxy   5. Medication management  - BASIC METABOLIC PANEL WITH GFR - Hepatic function panel - Magnesium       Recommended regular exercise, BP monitoring, weight control, and discussed med and SE's. Recommended labs to assess and monitor clinical status. Further disposition pending results of labs. Over 30 minutes of exam, counseling, chart review was performed

## 2016-01-12 NOTE — Patient Instructions (Signed)

## 2016-01-13 LAB — VITAMIN D 25 HYDROXY (VIT D DEFICIENCY, FRACTURES): VIT D 25 HYDROXY: 68 ng/mL (ref 30–100)

## 2016-01-13 LAB — HEMOGLOBIN A1C
Hgb A1c MFr Bld: 5.6 % (ref <5.7)
Mean Plasma Glucose: 114 mg/dL

## 2016-01-13 LAB — INSULIN, RANDOM: INSULIN: 2.7 u[IU]/mL (ref 2.0–19.6)

## 2016-01-13 LAB — TSH: TSH: 1.53 m[IU]/L

## 2016-04-17 ENCOUNTER — Encounter: Payer: Self-pay | Admitting: Physician Assistant

## 2016-04-17 ENCOUNTER — Ambulatory Visit (INDEPENDENT_AMBULATORY_CARE_PROVIDER_SITE_OTHER): Payer: Medicare HMO | Admitting: Physician Assistant

## 2016-04-17 VITALS — BP 122/76 | HR 62 | Temp 97.3°F | Resp 16 | Ht 63.0 in | Wt 125.8 lb

## 2016-04-17 DIAGNOSIS — R7303 Prediabetes: Secondary | ICD-10-CM | POA: Diagnosis not present

## 2016-04-17 DIAGNOSIS — Z79899 Other long term (current) drug therapy: Secondary | ICD-10-CM

## 2016-04-17 DIAGNOSIS — E2839 Other primary ovarian failure: Secondary | ICD-10-CM

## 2016-04-17 DIAGNOSIS — I10 Essential (primary) hypertension: Secondary | ICD-10-CM

## 2016-04-17 DIAGNOSIS — R69 Illness, unspecified: Secondary | ICD-10-CM | POA: Diagnosis not present

## 2016-04-17 DIAGNOSIS — E559 Vitamin D deficiency, unspecified: Secondary | ICD-10-CM | POA: Diagnosis not present

## 2016-04-17 DIAGNOSIS — Z0000 Encounter for general adult medical examination without abnormal findings: Secondary | ICD-10-CM

## 2016-04-17 DIAGNOSIS — Z0001 Encounter for general adult medical examination with abnormal findings: Secondary | ICD-10-CM | POA: Diagnosis not present

## 2016-04-17 DIAGNOSIS — F3342 Major depressive disorder, recurrent, in full remission: Secondary | ICD-10-CM | POA: Diagnosis not present

## 2016-04-17 DIAGNOSIS — E782 Mixed hyperlipidemia: Secondary | ICD-10-CM

## 2016-04-17 DIAGNOSIS — R6889 Other general symptoms and signs: Secondary | ICD-10-CM | POA: Diagnosis not present

## 2016-04-17 LAB — CBC WITH DIFFERENTIAL/PLATELET
BASOS ABS: 0 {cells}/uL (ref 0–200)
BASOS PCT: 0 %
EOS ABS: 612 {cells}/uL — AB (ref 15–500)
Eosinophils Relative: 12 %
HCT: 36.4 % (ref 35.0–45.0)
HEMOGLOBIN: 11.9 g/dL (ref 11.7–15.5)
LYMPHS ABS: 1479 {cells}/uL (ref 850–3900)
Lymphocytes Relative: 29 %
MCH: 29.5 pg (ref 27.0–33.0)
MCHC: 32.7 g/dL (ref 32.0–36.0)
MCV: 90.1 fL (ref 80.0–100.0)
MONO ABS: 408 {cells}/uL (ref 200–950)
MPV: 10.7 fL (ref 7.5–12.5)
Monocytes Relative: 8 %
Neutro Abs: 2601 cells/uL (ref 1500–7800)
Neutrophils Relative %: 51 %
Platelets: 213 10*3/uL (ref 140–400)
RBC: 4.04 MIL/uL (ref 3.80–5.10)
RDW: 14.3 % (ref 11.0–15.0)
WBC: 5.1 10*3/uL (ref 3.8–10.8)

## 2016-04-17 LAB — HEPATIC FUNCTION PANEL
ALBUMIN: 3.8 g/dL (ref 3.6–5.1)
ALK PHOS: 57 U/L (ref 33–130)
ALT: 12 U/L (ref 6–29)
AST: 19 U/L (ref 10–35)
BILIRUBIN TOTAL: 0.5 mg/dL (ref 0.2–1.2)
Bilirubin, Direct: 0.1 mg/dL (ref <=0.2)
Indirect Bilirubin: 0.4 mg/dL (ref 0.2–1.2)
Total Protein: 6.5 g/dL (ref 6.1–8.1)

## 2016-04-17 LAB — TSH: TSH: 1.16 m[IU]/L

## 2016-04-17 LAB — LIPID PANEL
CHOL/HDL RATIO: 2.8 ratio (ref <5.0)
Cholesterol: 183 mg/dL (ref <200)
HDL: 65 mg/dL (ref >50)
LDL CALC: 98 mg/dL (ref <100)
Triglycerides: 101 mg/dL (ref <150)
VLDL: 20 mg/dL (ref <30)

## 2016-04-17 LAB — BASIC METABOLIC PANEL WITH GFR
BUN: 22 mg/dL (ref 7–25)
CHLORIDE: 103 mmol/L (ref 98–110)
CO2: 27 mmol/L (ref 20–31)
CREATININE: 1.53 mg/dL — AB (ref 0.60–0.93)
Calcium: 9.2 mg/dL (ref 8.6–10.4)
GFR, Est African American: 38 mL/min — ABNORMAL LOW (ref >=60)
GFR, Est Non African American: 33 mL/min — ABNORMAL LOW (ref >=60)
Glucose, Bld: 87 mg/dL (ref 65–99)
POTASSIUM: 3.9 mmol/L (ref 3.5–5.3)
Sodium: 141 mmol/L (ref 135–146)

## 2016-04-17 NOTE — Progress Notes (Signed)
Patient ID: Monique Barton, female   DOB: 06/03/1940, 76 y.o.   MRN: [id number]   MEDICARE ANNUAL WELLNESS VISIT AND OV  Assessment:   Essential hypertension - continue medications, DASH diet, exercise and monitor at home. Call if greater than 130/80.  -     CBC with Differential/Platelet -     BASIC METABOLIC PANEL WITH GFR -     Hepatic function panel -     TSH  Hyperlipidemia -continue medications, check lipids, decrease fatty foods, increase activity.  -     Lipid panel  Prediabetes Discussed general issues about diabetes pathophysiology and management., Educational material distributed., Suggested low cholesterol diet., Encouraged aerobic exercise., Discussed foot care., Reminded to get yearly retinal exam.  Medication management -     Magnesium  Vitamin D deficiency Continue supplement  Encounter for Medicare annual wellness exam Get mgm and DEXA  Recurrent major depressive disorder, in full remission ([place]) Continue zoloft  Future Appointments Date Time Provider [place]  07/24/2016 9:00 AM Starlyn Skeans, PA-C [facility] None     Plan:   During the course of the visit the patient was educated and counseled about appropriate screening and preventive services including:    Pneumococcal vaccine   Influenza vaccine  Td vaccine  Screening electrocardiogram  Bone densitometry screening  Colorectal cancer screening  Diabetes screening  Glaucoma screening  Nutrition counseling   Advanced directives: requested   Subjective:   Monique Barton is a 76 y.o.  female who presents for Medicare Annual Wellness Visit and complete physical.  Date of last medicare wellness visit is unknown.  She has had elevated blood pressure. Her blood pressure has been controlled at home, & today their BP is BP: 122/76 She does not workout.  She is doing line dancing and sometimes gardens.  She denies chest pain, shortness of breath, dizziness.   She is on  cholesterol medication and denies myalgias. Her cholesterol is at goal. The cholesterol last visit was:  Lab Results  Component Value Date   CHOL 188 01/12/2016   HDL 72 01/12/2016   LDLCALC 98 01/12/2016   TRIG 90 01/12/2016   CHOLHDL 2.6 01/12/2016   She has had prediabetes. She has not been working on diet and exercise for prediabetes, and denies foot ulcerations, hyperglycemia, hypoglycemia , increased appetite, nausea, paresthesia of the feet, polydipsia, polyuria, visual disturbances, vomiting and weight loss. Last A1C in the office was:  Lab Results  Component Value Date   HGBA1C 5.6 01/12/2016   She is on zoloft for depression, which is in remission.   Patient is on Vitamin D supplement.   Lab Results  Component Value Date   VD25OH 68 01/12/2016     BMI is Body mass index is 22.28 kg/m., she is working on diet and exercise. Wt Readings from Last 3 Encounters:  04/17/16 125 lb 12.8 oz (57.1 kg)  01/12/16 125 lb 12.8 oz (57.1 kg)  06/30/15 122 lb (55.3 kg)     Names of Other Physician/Practitioners you currently use: 1. [facility] here for primary care 2. Dr. Dolores Lory, retired, eye doctor, last visit 2015 3. Not seeing one, dentist, last visit remote  Patient Care Team: Unk Pinto, MD as PCP - General (Internal Medicine)  Medication Review: Current Outpatient Prescriptions on File Prior to Visit  Medication Sig Dispense Refill  . aspirin 81 MG tablet Take 81 mg by mouth daily.    . cholecalciferol (VITAMIN D) 1000 UNITS tablet  Take 1,000 Units by mouth daily. TAKES 2000 IU QD    . FLUZONE HIGH-DOSE 0.5 ML SUSY     . hydrochlorothiazide (HYDRODIURIL) 25 MG tablet Take 1/2 to 1 tablet daily for BP & fluid 90 tablet 3  . losartan (COZAAR) 50 MG tablet Take 1 tablet (50 mg total) by mouth daily. 90 tablet 3  . Magnesium 250 MG TABS Take by mouth daily.    . pravastatin (PRAVACHOL) 40 MG tablet TAKE 1 TABLET (40 MG TOTAL) BY  MOUTH DAILY. 90 tablet 3  . sertraline (ZOLOFT) 50 MG tablet Take 1 tablet (50 mg total) by mouth daily. 90 tablet 3   No current facility-administered medications on file prior to visit.     Current Problems (verified) Patient Active Problem List   Diagnosis Date Noted  . Recurrent major depressive disorder, in full remission ([place]) 04/17/2016  . Encounter for Medicare annual wellness exam 12/28/2014  . Medication management 10/06/2013  . Essential hypertension 11/20/2012  . Hyperlipidemia 11/20/2012  . Prediabetes 11/20/2012  . Vitamin D deficiency 11/20/2012    Screening Tests Immunization History  Administered Date(s) Administered  . Influenza Nasal 11/06/2011  . Influenza, High Dose Seasonal PF 10/06/2013, 09/23/2014  . Influenza-Unspecified 10/24/2012  . Pneumococcal Conjugate-13 06/08/2014  . Pneumococcal-Unspecified 02/07/2006  . Tdap 04/04/2011  . Zoster 01/15/2009   Preventative care: Last colonoscopy: 1999 Cologuard June 2016 negative Mammogram 06/2013 DEXA 05/2013 PAP remote  Tdap 2013 Influenza 2017 at [facility] Prenvar 13 2016 Pneumonia 2008 Zoster 2011  History reviewed: allergies, current medications, past family history, past medical history, past social history, past surgical history and problem list  Allergies Allergies  Allergen Reactions  . Prednisone Other (See Comments)    HIGH DOSE CAUSES INSOMNIA    SURGICAL HISTORY She  has a past surgical history that includes Eye surgery (Bilateral, 2012). FAMILY HISTORY Her family history includes Cancer in her brother, brother, and father; Diabetes in her brother, brother, and mother; Heart disease in her mother; Hyperlipidemia in her mother; Hypertension in her mother. SOCIAL HISTORY She  reports that she is a non-smoker but has been exposed to tobacco smoke. She has never used smokeless tobacco. She reports that she does not drink alcohol or use drugs.   MEDICARE WELLNESS OBJECTIVES: Physical  activity: Current Exercise Habits: Home exercise routine, Type of exercise: walking, Time (Minutes): 30, Frequency (Times/Week): 2, Weekly Exercise (Minutes/Week): 60, Intensity: Mild Cardiac risk factors: Cardiac Risk Factors include: advanced age (>45men, >69 women);dyslipidemia;hypertension;sedentary lifestyle Depression/mood screen:   Depression screen [facility] 2/9 04/17/2016  Decreased Interest 0  Down, Depressed, Hopeless 0  PHQ - 2 Score 0  Altered sleeping -  Tired, decreased energy -  Change in appetite -  Feeling bad or failure about yourself  -  Trouble concentrating -  Moving slowly or fidgety/restless -  Suicidal thoughts -  PHQ-9 Score -  Difficult doing work/chores -    ADLs:  In your present state of health, do you have any difficulty performing the following activities: 04/17/2016 01/12/2016  Hearing? N N  Vision? N N  Difficulty concentrating or making decisions? N N  Walking or climbing stairs? N N  Dressing or bathing? N N  Doing errands, shopping? N N  Some recent data might be hidden     Cognitive Testing  Alert? Yes  Normal Appearance?Yes  Oriented to person? Yes  Place? Yes   Time? Yes  Recall of three objects?  Yes  Can perform simple calculations? Yes  Displays appropriate judgment?Yes  Can read the correct time from a watch face?Yes  EOL planning: Does Patient Have a Medical Advance Directive?: No    Review of Systems  Constitutional: Negative for chills, fever, malaise/fatigue and weight loss.  HENT: Negative for congestion, ear pain, nosebleeds and sore throat.   Eyes: Negative.   Respiratory: Negative for cough, shortness of breath and wheezing.   Cardiovascular: Negative for chest pain, palpitations and leg swelling.  Gastrointestinal: Negative for blood in stool, constipation, diarrhea, heartburn, melena, nausea and vomiting.  Genitourinary: Negative for dysuria, frequency and urgency.  Neurological: Negative for dizziness, sensory change, loss  of consciousness and headaches.  Psychiatric/Behavioral: Negative for depression. The patient is not nervous/anxious and does not have insomnia.      Objective:     BP 122/76   Pulse 62   Temp 97.3 F (36.3 C)   Resp 16   Ht 5\' 3"  (1.6 m)   Wt 125 lb 12.8 oz (57.1 kg)   SpO2 99%   BMI 22.28 kg/m   General Appearance: Well nourished, alert, WD/WN, female and in no apparent distress. Eyes: PERRLA, EOMs, conjunctiva no swelling or erythema, normal fundi and vessels. Sinuses: No frontal/maxillary tenderness ENT/Mouth: EACs patent / TMs  nl. Nares clear without erythema, swelling, mucoid exudates. Oral hygiene is good. No erythema, swelling, or exudate. Tongue normal, non-obstructing. Tonsils not swollen or erythematous. Hearing normal.  Neck: Supple, thyroid normal. No bruits, nodes or JVD. Respiratory: Respiratory effort normal.  BS equal and clear bilateral without rales, rhonci, wheezing or stridor. Cardio: Heart sounds are normal with regular rate and rhythm and no murmurs, rubs or gallops. Peripheral pulses are normal and equal bilaterally without edema. No aortic or femoral bruits. Chest: symmetric with normal excursions and percussion. Breasts: Symmetric, without lumps, nipple discharge, retractions, or fibrocystic changes.  Abdomen: Flat, soft  with nl bowel sounds. Nontender, no guarding, rebound, hernias, masses, or organomegaly.  Lymphatics: Non tender without lymphadenopathy.  Genitourinary:  Musculoskeletal: Full ROM all peripheral extremities, joint stability, 5/5 strength, and normal gait. Skin: Warm and dry without rashes, lesions, cyanosis, clubbing or  ecchymosis.  Neuro: Cranial nerves intact, reflexes equal bilaterally. Normal muscle tone, no cerebellar symptoms. Sensation intact.  Pysch: Alert and oriented X 3, normal affect, Insight and Judgment appropriate.   Medicare Attestation I have personally reviewed: The patient's medical and social history Their use of  alcohol, tobacco or illicit drugs Their current medications and supplements The patient's functional ability including ADLs,fall risks, home safety risks, cognitive, and hearing and visual impairment Diet and physical activities Evidence for depression or mood disorders  The patient's weight, height, BMI, and visual acuity have been recorded in the chart.  I have made referrals, counseling, and provided education to the patient based on review of the above and I have provided the patient with a written personalized care plan for preventive services.  Over 40 minutes of exam, counseling, chart review was performed.   Vicie Mutters, PA-C   04/17/2016

## 2016-04-17 NOTE — Patient Instructions (Signed)
[facility]  7 a.m.-6:30 p.m., Monday 7 a.m.-5 p.m., Tuesday-Friday Schedule an appointment by calling [phone].  Encourage you to get the 3D Mammogram  The 3D Mammogram is much more specific and sensitive to pick up breast cancer. For women with fibrocystic breast or lumpy breast it can be hard to determine if it is cancer or not but the 3D mammogram is able to tell this difference which cuts back on unneeded additional tests or scary call backs.   - over 40% increase in detection of breast cancer - over 40% reduction in false positives.  - fewer call backs - reduced anxiety - improved outcomes - [facility]   Try the melatonin 5mg -20mg  dissolvable or gummy 30 mins before bed  11 Tips to Follow:  1. No caffeine after 3pm: Avoid beverages with caffeine (soda, tea, energy drinks, etc.) especially after 3pm. 2. Don't go to bed hungry: Have your evening meal at least 3 hrs. before going to sleep. It's fine to have a small bedtime snack such as a glass of milk and a few crackers but don't have a big meal. 3. Have a nightly routine before bed: Plan on "winding down" before you go to sleep. Begin relaxing about 1 hour before you go to bed. Try doing a quiet activity such as listening to calming music, reading a book or meditating. 4. Turn off the TV and ALL electronics including video games, tablets, laptops, etc. 1 hour before sleep, and keep them out of the bedroom. 5. Turn off your cell phone and all notifications (new email and text alerts) or even better, leave your phone outside your room while you sleep. Studies have shown that a part of your brain continues to respond to certain lights and sounds even while you're still asleep. 6. Make your bedroom quiet, dark and cool. If you can't control the noise, try wearing earplugs or using a fan to block out other sounds. 7. Practice relaxation techniques. Try reading a book or meditating or drain your brain by  writing a list of what you need to do the next day. 8. Don't nap unless you feel sick: you'll have a better night's sleep. 9. Don't smoke, or quit if you do. Nicotine, alcohol, and marijuana can all keep you awake. Talk to your health care provider if you need help with substance use. 10. Most importantly, wake up at the same time every day (or within 1 hour of your usual wake up time) EVEN on the weekends. A regular wake up time promotes sleep hygiene and prevents sleep problems. 11. Reduce exposure to bright light in the last three hours of the day before going to sleep. Maintaining good sleep hygiene and having good sleep habits lower your risk of developing sleep problems. Getting better sleep can also improve your concentration and alertness. Try the simple steps in this guide. If you still have trouble getting enough rest, make an appointment with your health care provider.

## 2016-04-18 ENCOUNTER — Other Ambulatory Visit: Payer: Self-pay | Admitting: Physician Assistant

## 2016-04-18 DIAGNOSIS — Z1231 Encounter for screening mammogram for malignant neoplasm of breast: Secondary | ICD-10-CM

## 2016-04-18 LAB — MAGNESIUM: MAGNESIUM: 2 mg/dL (ref 1.5–2.5)

## 2016-04-18 NOTE — Progress Notes (Signed)
LVM for pt to return office call for LAB results.

## 2016-04-18 NOTE — Progress Notes (Signed)
Pt aware of lab results & voiced understanding of those results.

## 2016-06-05 DIAGNOSIS — R233 Spontaneous ecchymoses: Secondary | ICD-10-CM | POA: Diagnosis not present

## 2016-06-05 DIAGNOSIS — I10 Essential (primary) hypertension: Secondary | ICD-10-CM | POA: Diagnosis not present

## 2016-06-05 DIAGNOSIS — Z79899 Other long term (current) drug therapy: Secondary | ICD-10-CM | POA: Diagnosis not present

## 2016-06-05 DIAGNOSIS — K08409 Partial loss of teeth, unspecified cause, unspecified class: Secondary | ICD-10-CM | POA: Diagnosis not present

## 2016-06-05 DIAGNOSIS — Z98811 Dental restoration status: Secondary | ICD-10-CM | POA: Diagnosis not present

## 2016-06-05 DIAGNOSIS — R69 Illness, unspecified: Secondary | ICD-10-CM | POA: Diagnosis not present

## 2016-06-05 DIAGNOSIS — Z9849 Cataract extraction status, unspecified eye: Secondary | ICD-10-CM | POA: Diagnosis not present

## 2016-06-05 DIAGNOSIS — Z7982 Long term (current) use of aspirin: Secondary | ICD-10-CM | POA: Diagnosis not present

## 2016-06-05 DIAGNOSIS — M25531 Pain in right wrist: Secondary | ICD-10-CM | POA: Diagnosis not present

## 2016-06-05 DIAGNOSIS — Z0000 Encounter for general adult medical examination without abnormal findings: Secondary | ICD-10-CM | POA: Diagnosis not present

## 2016-06-05 DIAGNOSIS — Z6821 Body mass index (BMI) 21.0-21.9, adult: Secondary | ICD-10-CM | POA: Diagnosis not present

## 2016-06-05 DIAGNOSIS — E785 Hyperlipidemia, unspecified: Secondary | ICD-10-CM | POA: Diagnosis not present

## 2016-06-29 ENCOUNTER — Encounter: Payer: Self-pay | Admitting: Internal Medicine

## 2016-07-24 ENCOUNTER — Encounter: Payer: Self-pay | Admitting: Internal Medicine

## 2016-08-15 NOTE — Progress Notes (Signed)
Patient ID: Monique Barton, female   DOB: Nov 01, 1940, 76 y.o.   MRN: [id number]   CPE AND OV  Assessment and Plan:   Essential hypertension - continue medications, DASH diet, exercise and monitor at home. Call if greater than 130/80.  -     CBC with Differential/Platelet -     BASIC METABOLIC PANEL WITH GFR -     Hepatic function panel -     TSH  Hyperlipidemia -continue medications, check lipids, decrease fatty foods, increase activity.  -     Lipid panel  Prediabetes Discussed general issues about diabetes pathophysiology and management., Educational material distributed., Suggested low cholesterol diet., Encouraged aerobic exercise., Discussed foot care., Reminded to get yearly retinal exam.  Medication management -     Magnesium  Vitamin D deficiency Continue supplement  Encounter for Medicare annual wellness exam Get mgm and DEXA  Recurrent major depressive disorder, in full remission ([place]) Continue zoloft  Estrogen deficiency Declines DEXA  Future Appointments Date Time Provider [place]  08/26/2017 10:00 AM Vicie Mutters, PA-C [id number] None     Subjective:   Monique Barton is a 76 y.o.  female who presents for complete physical.    She has had elevated blood pressure. Her blood pressure has been controlled at home, & today their BP is BP: 120/76 She does not workout.  She is doing line dancing and sometimes gardens.  She denies chest pain, shortness of breath, dizziness.   She is on cholesterol medication and denies myalgias. Her cholesterol is at goal. The cholesterol last visit was:  Lab Results  Component Value Date   CHOL 183 04/17/2016   HDL 65 04/17/2016   LDLCALC 98 04/17/2016   TRIG 101 04/17/2016   CHOLHDL 2.8 04/17/2016   She has had prediabetes. She has not been working on diet and exercise for prediabetes, and denies foot ulcerations, hyperglycemia, hypoglycemia , increased appetite, nausea, paresthesia of the feet, polydipsia,  polyuria, visual disturbances, vomiting and weight loss. Last A1C in the office was:  Lab Results  Component Value Date   HGBA1C 5.6 01/12/2016   She is on zoloft for depression, which is in remission.   Patient is on Vitamin D supplement.   Lab Results  Component Value Date   VD25OH 68 01/12/2016     BMI is Body mass index is 21.79 kg/m., she is working on diet and exercise. Wt Readings from Last 3 Encounters:  08/16/16 123 lb (55.8 kg)  04/17/16 125 lb 12.8 oz (57.1 kg)  01/12/16 125 lb 12.8 oz (57.1 kg)     Names of Other Physician/Practitioners you currently use: 1. [facility] here for primary care 2. Dr. Dolores Lory, retired, eye doctor, last visit 2015 3. Not seeing one, dentist, last visit remote  Patient Care Team: Unk Pinto, MD as PCP - General (Internal Medicine)  Medication Review: Current Outpatient Prescriptions on File Prior to Visit  Medication Sig Dispense Refill  . aspirin 81 MG tablet Take 81 mg by mouth daily.    . cholecalciferol (VITAMIN D) 1000 UNITS tablet Take 1,000 Units by mouth daily. TAKES 2000 IU QD    . FLUZONE HIGH-DOSE 0.5 ML SUSY     . hydrochlorothiazide (HYDRODIURIL) 25 MG tablet Take 1/2 to 1 tablet daily for BP & fluid 90 tablet 3  . losartan (COZAAR) 50 MG tablet Take 1 tablet (50 mg total) by mouth daily. 90 tablet 3  . Magnesium 250 MG TABS Take by  mouth daily.    . pravastatin (PRAVACHOL) 40 MG tablet TAKE 1 TABLET (40 MG TOTAL) BY MOUTH DAILY. 90 tablet 3  . sertraline (ZOLOFT) 50 MG tablet Take 1 tablet (50 mg total) by mouth daily. 90 tablet 3   No current facility-administered medications on file prior to visit.     Current Problems (verified) Patient Active Problem List   Diagnosis Date Noted  . Recurrent major depressive disorder, in full remission ([place]) 04/17/2016  . Encounter for Medicare annual wellness exam 12/28/2014  . Medication management 10/06/2013  . Essential hypertension  11/20/2012  . Hyperlipidemia 11/20/2012  . Prediabetes 11/20/2012  . Vitamin D deficiency 11/20/2012    Screening Tests Immunization History  Administered Date(s) Administered  . Influenza Nasal 11/06/2011  . Influenza, High Dose Seasonal PF 10/06/2013, 09/23/2014  . Influenza-Unspecified 10/24/2012  . Pneumococcal Conjugate-13 06/08/2014  . Pneumococcal-Unspecified 02/07/2006  . Tdap 04/04/2011  . Zoster 01/15/2009   Preventative care: Last colonoscopy: 1999 declines Cologuard June 2016 negative Mammogram 06/2013 DUE DEXA 05/2013 PAP remote  Tdap 2013 Influenza 2017 at [facility] Prenvar 13 2016 Pneumonia 2008 Zoster 2011  History reviewed: allergies, current medications, past family history, past medical history, past social history, past surgical history and problem list  Allergies Allergies  Allergen Reactions  . Prednisone Other (See Comments)    HIGH DOSE CAUSES INSOMNIA    SURGICAL HISTORY She  has a past surgical history that includes Eye surgery (Bilateral, 2012). FAMILY HISTORY Her family history includes Cancer in her brother, brother, and father; Diabetes in her brother, brother, and mother; Heart disease in her mother; Hyperlipidemia in her mother; Hypertension in her mother. SOCIAL HISTORY She  reports that she is a non-smoker but has been exposed to tobacco smoke. She has never used smokeless tobacco. She reports that she does not drink alcohol or use drugs.     Review of Systems  Constitutional: Negative for chills, fever, malaise/fatigue and weight loss.  HENT: Negative for congestion, ear pain, nosebleeds and sore throat.   Eyes: Negative.   Respiratory: Negative for cough, shortness of breath and wheezing.   Cardiovascular: Negative for chest pain, palpitations and leg swelling.  Gastrointestinal: Negative for blood in stool, constipation, diarrhea, heartburn, melena, nausea and vomiting.  Genitourinary: Negative for dysuria, frequency and  urgency.  Neurological: Negative for dizziness, sensory change, loss of consciousness and headaches.  Psychiatric/Behavioral: Negative for depression. The patient is not nervous/anxious and does not have insomnia.      Objective:     BP 120/76   Pulse 61   Temp 97.7 F (36.5 C)   Resp 14   Ht 5\' 3"  (1.6 m)   Wt 123 lb (55.8 kg)   SpO2 99%   BMI 21.79 kg/m   General Appearance: Well nourished, alert, WD/WN, female and in no apparent distress. Eyes: PERRLA, EOMs, conjunctiva no swelling or erythema, normal fundi and vessels. Sinuses: No frontal/maxillary tenderness ENT/Mouth: EACs patent / TMs  nl. Nares clear without erythema, swelling, mucoid exudates. Oral hygiene is good. No erythema, swelling, or exudate. Tongue normal, non-obstructing. Tonsils not swollen or erythematous. Hearing normal.  Neck: Supple, thyroid normal. No bruits, nodes or JVD. Respiratory: Respiratory effort normal.  BS equal and clear bilateral without rales, rhonci, wheezing or stridor. Cardio: Heart sounds are normal with regular rate and rhythm and no murmurs, rubs or gallops. Peripheral pulses are normal and equal bilaterally without edema. No aortic or femoral bruits. Chest: symmetric with normal excursions and percussion. Breasts: Symmetric,  without lumps, nipple discharge, retractions, or fibrocystic changes.  Abdomen: Flat, soft  with nl bowel sounds. Nontender, no guarding, rebound, hernias, masses, or organomegaly.  Lymphatics: Non tender without lymphadenopathy.  Genitourinary:  Musculoskeletal: Full ROM all peripheral extremities, joint stability, 5/5 strength, and normal gait. Skin: Warm and dry without rashes, lesions, cyanosis, clubbing or  ecchymosis.  Neuro: Cranial nerves intact, reflexes equal bilaterally. Normal muscle tone, no cerebellar symptoms. Sensation intact.  Pysch: Alert and oriented X 3, normal affect, Insight and Judgment appropriate.     Vicie Mutters, PA-C   08/16/2016

## 2016-08-16 ENCOUNTER — Encounter: Payer: Self-pay | Admitting: Physician Assistant

## 2016-08-16 ENCOUNTER — Ambulatory Visit (INDEPENDENT_AMBULATORY_CARE_PROVIDER_SITE_OTHER): Payer: Medicare HMO | Admitting: Physician Assistant

## 2016-08-16 VITALS — BP 120/76 | HR 61 | Temp 97.7°F | Resp 14 | Ht 63.0 in | Wt 123.0 lb

## 2016-08-16 DIAGNOSIS — Z136 Encounter for screening for cardiovascular disorders: Secondary | ICD-10-CM

## 2016-08-16 DIAGNOSIS — F3342 Major depressive disorder, recurrent, in full remission: Secondary | ICD-10-CM

## 2016-08-16 DIAGNOSIS — Z0000 Encounter for general adult medical examination without abnormal findings: Secondary | ICD-10-CM

## 2016-08-16 DIAGNOSIS — E559 Vitamin D deficiency, unspecified: Secondary | ICD-10-CM

## 2016-08-16 DIAGNOSIS — Z79899 Other long term (current) drug therapy: Secondary | ICD-10-CM | POA: Diagnosis not present

## 2016-08-16 DIAGNOSIS — I10 Essential (primary) hypertension: Secondary | ICD-10-CM | POA: Diagnosis not present

## 2016-08-16 DIAGNOSIS — E782 Mixed hyperlipidemia: Secondary | ICD-10-CM

## 2016-08-16 DIAGNOSIS — E2839 Other primary ovarian failure: Secondary | ICD-10-CM

## 2016-08-16 DIAGNOSIS — Z0001 Encounter for general adult medical examination with abnormal findings: Secondary | ICD-10-CM

## 2016-08-16 DIAGNOSIS — R7303 Prediabetes: Secondary | ICD-10-CM

## 2016-08-16 LAB — LIPID PANEL
Cholesterol: 168 mg/dL (ref <200)
HDL: 70 mg/dL (ref >50)
LDL CALC: 86 mg/dL (ref <100)
Total CHOL/HDL Ratio: 2.4 Ratio (ref <5.0)
Triglycerides: 58 mg/dL (ref <150)
VLDL: 12 mg/dL (ref <30)

## 2016-08-16 LAB — CBC WITH DIFFERENTIAL/PLATELET
BASOS ABS: 0 {cells}/uL (ref 0–200)
Basophils Relative: 0 %
EOS ABS: 159 {cells}/uL (ref 15–500)
Eosinophils Relative: 3 %
HCT: 36.2 % (ref 35.0–45.0)
HEMOGLOBIN: 11.6 g/dL — AB (ref 11.7–15.5)
LYMPHS PCT: 30 %
Lymphs Abs: 1590 cells/uL (ref 850–3900)
MCH: 29.5 pg (ref 27.0–33.0)
MCHC: 32 g/dL (ref 32.0–36.0)
MCV: 92.1 fL (ref 80.0–100.0)
MONOS PCT: 8 %
MPV: 10.9 fL (ref 7.5–12.5)
Monocytes Absolute: 424 cells/uL (ref 200–950)
NEUTROS ABS: 3127 {cells}/uL (ref 1500–7800)
NEUTROS PCT: 59 %
Platelets: 205 10*3/uL (ref 140–400)
RBC: 3.93 MIL/uL (ref 3.80–5.10)
RDW: 14 % (ref 11.0–15.0)
WBC: 5.3 10*3/uL (ref 3.8–10.8)

## 2016-08-16 LAB — TSH: TSH: 0.81 m[IU]/L

## 2016-08-16 LAB — BASIC METABOLIC PANEL WITH GFR
BUN: 28 mg/dL — ABNORMAL HIGH (ref 7–25)
CHLORIDE: 103 mmol/L (ref 98–110)
CO2: 27 mmol/L (ref 20–31)
Calcium: 9.1 mg/dL (ref 8.6–10.4)
Creat: 1.51 mg/dL — ABNORMAL HIGH (ref 0.60–0.93)
GFR, EST NON AFRICAN AMERICAN: 33 mL/min — AB (ref >=60)
GFR, Est African American: 38 mL/min — ABNORMAL LOW (ref >=60)
Glucose, Bld: 82 mg/dL (ref 65–99)
Potassium: 4.4 mmol/L (ref 3.5–5.3)
SODIUM: 140 mmol/L (ref 135–146)

## 2016-08-16 LAB — HEPATIC FUNCTION PANEL
ALBUMIN: 3.9 g/dL (ref 3.6–5.1)
ALK PHOS: 68 U/L (ref 33–130)
ALT: 20 U/L (ref 6–29)
AST: 22 U/L (ref 10–35)
BILIRUBIN TOTAL: 0.4 mg/dL (ref 0.2–1.2)
Bilirubin, Direct: 0.1 mg/dL (ref <=0.2)
Indirect Bilirubin: 0.3 mg/dL (ref 0.2–1.2)
TOTAL PROTEIN: 6.4 g/dL (ref 6.1–8.1)

## 2016-08-16 NOTE — Patient Instructions (Addendum)
[facility]  7 a.m.-6:30 p.m., Monday 7 a.m.-5 p.m., Tuesday-Friday Schedule an appointment by calling [phone].    10 Tips on Belching, Bloating, and Flatulence 1. Belching is caused by swallowed air from:  Eating or drinking too fast  Poorly fitting dentures; not chewing food completely  Carbonated beverages  Chewing gum or sucking on hard candies  Excessive swallowing due to nervous tension or postnasal drip  Forced belching to relieve abdominal discomfort 2. To prevent excessive belching, avoid:  Carbonated beverages  Chewing gum  Hard candies  Simethicone/GasX may be helpful  3. Abdominal bloating and discomfort may be due to intestinal sensitivity or symptoms of irritable bowel syndrome. To relieve symptoms, avoid:  Broccoli  Baked beans  Cabbage  Carbonated drinks  Cauliflower  Chewing gum  Hard candy 4. Abdominal distention resulting from weak abdominal muscles:  Is better in the morning  Gets worse as the day progresses  Is relieved by lying down 5. To prevent Abdominal distention:  Tighten abdominal muscles by pulling in your stomach several times during the day  Do sit-up exercises if possible  Wear an abdominal support garment if exercise is too difficult 6. Flatulence is gas created through bacterial action in the bowel and passed rectally. Keep in mind that:  10-18 passages per day are normal  Primary gases are harmless and odorless  Noticeable smells are trace gases related to food intake 7. Foods that are likely to form gas include:  Milk, dairy products, and medications that contain lactose--If your body doesn't produce the enzyme (lactase) to break it down.  Certain vegetables--baked beans, cauliflower, broccoli, cabbage  Certain starches--wheat, oats, corn, potatoes. Rice is a good substitute. 8. Identify offending foods. Reduce or eliminate these gas-forming foods from your diet.

## 2016-08-17 LAB — URINALYSIS, ROUTINE W REFLEX MICROSCOPIC
Bilirubin Urine: NEGATIVE
Glucose, UA: NEGATIVE
HGB URINE DIPSTICK: NEGATIVE
Ketones, ur: NEGATIVE
LEUKOCYTES UA: NEGATIVE
NITRITE: NEGATIVE
PROTEIN: NEGATIVE
Specific Gravity, Urine: 1.005 (ref 1.001–1.035)
pH: 6.5 (ref 5.0–8.0)

## 2016-08-17 LAB — MICROALBUMIN / CREATININE URINE RATIO: CREATININE, URINE: 26 mg/dL (ref 20–320)

## 2016-08-17 LAB — MAGNESIUM: MAGNESIUM: 2 mg/dL (ref 1.5–2.5)

## 2016-08-17 NOTE — Progress Notes (Signed)
Pt aware of lab results & voiced understanding of those results.

## 2016-10-29 DIAGNOSIS — R69 Illness, unspecified: Secondary | ICD-10-CM | POA: Diagnosis not present

## 2017-02-07 ENCOUNTER — Other Ambulatory Visit: Payer: Self-pay | Admitting: Internal Medicine

## 2017-02-07 DIAGNOSIS — I10 Essential (primary) hypertension: Secondary | ICD-10-CM

## 2017-02-20 NOTE — Progress Notes (Signed)
This very nice 77 y.o. WWF presents for follow up with Hypertension, Hyperlipidemia, Pre-Diabetes and Vitamin D Deficiency.      Patient is treated for HTN & BP has been controlled at home. Today's BP is at goal - 138/64. Patient has had no complaints of any cardiac type chest pain, palpitations, dyspnea / orthopnea / PND, dizziness, claudication, or dependent edema.     Hyperlipidemia is controlled with diet & meds. Patient denies myalgias or other med SE's. Last Lipids were at goal: Lab Results  Component Value Date   CHOL 168 08/16/2016   HDL 70 08/16/2016   LDLCALC 86 08/16/2016   TRIG 58 08/16/2016   CHOLHDL 2.4 08/16/2016      Also, the patient has history of PreDiabetes (A1c 5.8%/2013and 6.0%/Nov,2016) and has had no symptoms of reactive hypoglycemia, diabetic polys, paresthesias or visual blurring.  Last A1c was Normal & at goal:  Lab Results  Component Value Date   HGBA1C 5.6 01/12/2016      Further, the patient also has history of Vitamin D Deficiency and supplements vitamin D without any suspected side-effects. Last vitamin D was at goal:  Lab Results  Component Value Date   VD25OH 68 01/12/2016   Current Outpatient Medications on File Prior to Visit  Medication Sig  . aspirin 81 MG  Take  daily.  Marland Kitchen VIT D 1000 UNITS Take TAKES 2000 IU QD  . hydrochlorothiazide  25 MG  TAKE 0.5-1 TAB EVERY DAY  . losartan  50 MG  TAKE 1 TAB EVERY DAY  . Magnesium 250 MG Take  daily.  . pravastatin  40 MG TAKE 1 TAB EVERY DAY  . sertraline50 MG TAKE 1 TAB EVERY DAY   Allergies  Allergen Reactions  . Prednisone Other (See Comments)    HIGH DOSE CAUSES INSOMNIA   PMHx:   Past Medical History:  Diagnosis Date  . Hyperlipidemia   . Hypertension   . Other abnormal glucose   . Unspecified vitamin D deficiency   . Wrist fracture, right    Immunization History  Administered Date(s) Administered  . Influenza Nasal 11/06/2011  . Influenza, High Dose Seasonal PF 10/06/2013,  09/23/2014  . Influenza-Unspecified 10/24/2012, 10/15/2016  . Pneumococcal Conjugate-13 06/08/2014  . Pneumococcal-Unspecified 02/07/2006  . Tdap 04/04/2011  . Zoster 01/15/2009   Past Surgical History:  Procedure Laterality Date  . EYE SURGERY Bilateral 2012   FHx:    Reviewed / unchanged  SHx:    Reviewed / unchanged   Systems Review:  Constitutional: Denies fever, chills, wt changes, headaches, insomnia, fatigue, night sweats, change in appetite. Eyes: Denies redness, blurred vision, diplopia, discharge, itchy, watery eyes.  ENT: Denies discharge, congestion, post nasal drip, epistaxis, sore throat, earache, hearing loss, dental pain, tinnitus, vertigo, sinus pain, snoring.  CV: Denies chest pain, palpitations, irregular heartbeat, syncope, dyspnea, diaphoresis, orthopnea, PND, claudication or edema. Respiratory: denies cough, dyspnea, DOE, pleurisy, hoarseness, laryngitis, wheezing.  Gastrointestinal: Denies dysphagia, odynophagia, heartburn, reflux, water brash, abdominal pain or cramps, nausea, vomiting, bloating, diarrhea, constipation, hematemesis, melena, hematochezia  or hemorrhoids. Genitourinary: Denies dysuria, frequency, urgency, nocturia, hesitancy, discharge, hematuria or flank pain. Musculoskeletal: Denies arthralgias, myalgias, stiffness, jt. swelling, pain, limping or strain/sprain.  Skin: Denies pruritus, rash, hives, warts, acne, eczema or change in skin lesion(s). Neuro: No weakness, tremor, incoordination, spasms, paresthesia or pain. Psychiatric: Denies confusion, memory loss or sensory loss. Endo: Denies change in weight, skin or hair change.  Heme/Lymph: No excessive bleeding, bruising or  enlarged lymph nodes.  Physical Exam  BP 138/64   Pulse 64   Temp (!) 97.1 F (36.2 C)   Resp 16   Ht 5\' 3"  (1.6 m)   Wt 125 lb (56.7 kg)   BMI 22.14 kg/m   Appears well nourished, well groomed  and in no distress.  Eyes: PERRLA, EOMs, conjunctiva no swelling or  erythema. Sinuses: No frontal/maxillary tenderness ENT/Mouth: EAC's clear, TM's nl w/o erythema, bulging. Nares clear w/o erythema, swelling, exudates. Oropharynx clear without erythema or exudates. Oral hygiene is good. Tongue normal, non obstructing. Hearing intact.  Neck: Supple. Thyroid nl. Car 2+/2+ without bruits, nodes or JVD. Chest: Respirations nl with BS clear & equal w/o rales, rhonchi, wheezing or stridor.  Cor: Heart sounds normal w/ regular rate and rhythm without sig. murmurs, gallops, clicks or rubs. Peripheral pulses normal and equal  without edema.  Abdomen: Soft & bowel sounds normal. Non-tender w/o guarding, rebound, hernias, masses or organomegaly.  Lymphatics: Unremarkable.  Musculoskeletal: Full ROM all peripheral extremities, joint stability, 5/5 strength and normal gait.  Skin: Warm, dry without exposed rashes, lesions or ecchymosis apparent.  Neuro: Cranial nerves intact, reflexes equal bilaterally. Sensory-motor testing grossly intact. Tendon reflexes grossly intact.  Pysch: Alert & oriented x 3.  Insight and judgement nl & appropriate. No ideations.  Assessment and Plan:  1. Essential hypertension  - Continue medication, monitor blood pressure at home.  - Continue DASH diet. Reminder to go to the ER if any CP,  SOB, nausea, dizziness, severe HA, changes vision/speech.  - CBC with Differential/Platelet - BASIC METABOLIC PANEL WITH GFR - Magnesium - TSH  2. Hyperlipidemia, mixed  - Continue diet/meds, exercise,& lifestyle modifications.  - Continue monitor periodic cholesterol/liver & renal functions   - Hepatic function panel - Lipid panel - TSH  3. Prediabetes  - Continue diet, exercise, lifestyle modifications.  - Monitor appropriate labs.  - Hemoglobin A1c - Insulin, random  4. Vitamin D deficiency  - Continue supplementation.     - VITAMIN D 25 Hydroxy  5. Abnormal glucose  - Hemoglobin A1c - Insulin, random  6. Medication  management  - CBC with Differential/Platelet - BASIC METABOLIC PANEL WITH GFR - Hepatic function panel - Magnesium - Lipid panel - TSH - Hemoglobin A1c - Insulin, random - VITAMIN D 25 Hydroxy         Discussed  regular exercise, BP monitoring, weight control to achieve/maintain BMI less than 25 and discussed med and SE's. Recommended labs to assess and monitor clinical status with further disposition pending results of labs. Over 30 minutes of exam, counseling, chart review was performed.

## 2017-02-20 NOTE — Patient Instructions (Signed)

## 2017-02-21 ENCOUNTER — Ambulatory Visit (INDEPENDENT_AMBULATORY_CARE_PROVIDER_SITE_OTHER): Payer: Medicare HMO | Admitting: Internal Medicine

## 2017-02-21 VITALS — BP 138/64 | HR 64 | Temp 97.1°F | Resp 16 | Ht 63.0 in | Wt 125.0 lb

## 2017-02-21 DIAGNOSIS — E559 Vitamin D deficiency, unspecified: Secondary | ICD-10-CM | POA: Diagnosis not present

## 2017-02-21 DIAGNOSIS — E782 Mixed hyperlipidemia: Secondary | ICD-10-CM

## 2017-02-21 DIAGNOSIS — R7303 Prediabetes: Secondary | ICD-10-CM

## 2017-02-21 DIAGNOSIS — I10 Essential (primary) hypertension: Secondary | ICD-10-CM | POA: Diagnosis not present

## 2017-02-21 DIAGNOSIS — Z79899 Other long term (current) drug therapy: Secondary | ICD-10-CM

## 2017-02-21 DIAGNOSIS — R7309 Other abnormal glucose: Secondary | ICD-10-CM

## 2017-02-22 LAB — BASIC METABOLIC PANEL WITH GFR
BUN/Creatinine Ratio: 20 (calc) (ref 6–22)
BUN: 30 mg/dL — ABNORMAL HIGH (ref 7–25)
CO2: 28 mmol/L (ref 20–32)
Calcium: 9.5 mg/dL (ref 8.6–10.4)
Chloride: 104 mmol/L (ref 98–110)
Creat: 1.48 mg/dL — ABNORMAL HIGH (ref 0.60–0.93)
GFR, EST AFRICAN AMERICAN: 39 mL/min/{1.73_m2} — AB (ref >=60)
GFR, EST NON AFRICAN AMERICAN: 34 mL/min/{1.73_m2} — AB (ref >=60)
Glucose, Bld: 93 mg/dL (ref 65–99)
Potassium: 4.3 mmol/L (ref 3.5–5.3)
SODIUM: 141 mmol/L (ref 135–146)

## 2017-02-22 LAB — CBC WITH DIFFERENTIAL/PLATELET
BASOS ABS: 38 {cells}/uL (ref 0–200)
Basophils Relative: 0.8 %
EOS PCT: 4.6 %
Eosinophils Absolute: 221 cells/uL (ref 15–500)
HEMATOCRIT: 36.1 % (ref 35.0–45.0)
Hemoglobin: 11.6 g/dL — ABNORMAL LOW (ref 11.7–15.5)
LYMPHS ABS: 1819 {cells}/uL (ref 850–3900)
MCH: 28.6 pg (ref 27.0–33.0)
MCHC: 32.1 g/dL (ref 32.0–36.0)
MCV: 88.9 fL (ref 80.0–100.0)
MPV: 11.5 fL (ref 7.5–12.5)
Monocytes Relative: 9.1 %
Neutro Abs: 2285 cells/uL (ref 1500–7800)
Neutrophils Relative %: 47.6 %
PLATELETS: 209 10*3/uL (ref 140–400)
RBC: 4.06 10*6/uL (ref 3.80–5.10)
RDW: 12.7 % (ref 11.0–15.0)
Total Lymphocyte: 37.9 %
WBC mixed population: 437 cells/uL (ref 200–950)
WBC: 4.8 10*3/uL (ref 3.8–10.8)

## 2017-02-22 LAB — HEPATIC FUNCTION PANEL
AG Ratio: 1.6 (calc) (ref 1.0–2.5)
ALKALINE PHOSPHATASE (APISO): 69 U/L (ref 33–130)
ALT: 13 U/L (ref 6–29)
AST: 20 U/L (ref 10–35)
Albumin: 4.2 g/dL (ref 3.6–5.1)
BILIRUBIN DIRECT: 0.1 mg/dL (ref 0.0–0.2)
BILIRUBIN TOTAL: 0.6 mg/dL (ref 0.2–1.2)
Globulin: 2.7 g/dL (calc) (ref 1.9–3.7)
Indirect Bilirubin: 0.5 mg/dL (calc) (ref 0.2–1.2)
Total Protein: 6.9 g/dL (ref 6.1–8.1)

## 2017-02-22 LAB — LIPID PANEL
Cholesterol: 176 mg/dL (ref <200)
HDL: 70 mg/dL (ref >50)
LDL Cholesterol (Calc): 91 mg/dL (calc)
NON-HDL CHOLESTEROL (CALC): 106 mg/dL (ref <130)
Total CHOL/HDL Ratio: 2.5 (calc) (ref <5.0)
Triglycerides: 60 mg/dL (ref <150)

## 2017-02-22 LAB — HEMOGLOBIN A1C
HEMOGLOBIN A1C: 5.7 %{Hb} — AB (ref <5.7)
MEAN PLASMA GLUCOSE: 117 (calc)
eAG (mmol/L): 6.5 (calc)

## 2017-02-22 LAB — INSULIN, RANDOM: Insulin: 2.5 u[IU]/mL (ref 2.0–19.6)

## 2017-02-22 LAB — VITAMIN D 25 HYDROXY (VIT D DEFICIENCY, FRACTURES): VIT D 25 HYDROXY: 35 ng/mL (ref 30–100)

## 2017-02-22 LAB — MAGNESIUM: Magnesium: 2.2 mg/dL (ref 1.5–2.5)

## 2017-02-22 LAB — TSH: TSH: 0.82 m[IU]/L (ref 0.40–4.50)

## 2017-02-24 ENCOUNTER — Encounter: Payer: Self-pay | Admitting: Internal Medicine

## 2017-04-30 ENCOUNTER — Other Ambulatory Visit: Payer: Self-pay | Admitting: Internal Medicine

## 2017-06-05 DIAGNOSIS — Z6822 Body mass index (BMI) 22.0-22.9, adult: Secondary | ICD-10-CM | POA: Insufficient documentation

## 2017-06-05 DIAGNOSIS — N183 Chronic kidney disease, stage 3 unspecified: Secondary | ICD-10-CM | POA: Insufficient documentation

## 2017-06-05 NOTE — Progress Notes (Signed)
MEDICARE ANNUAL WELLNESS VISIT AND FOLLOW UP  Assessment:   Diagnoses and all orders for this visit:  Encounter for Medicare annual wellness exam  Essential hypertension Continue medication Monitor blood pressure at home; call if consistently over 130/80 Continue DASH diet.   Reminder to go to the ER if any CP, SOB, nausea, dizziness, severe HA, changes vision/speech, left arm numbness and tingling and jaw pain.  Vitamin D deficiency Continue supplementation Check vitamin D level  Recurrent major depressive disorder, in full remission ([place]) Continue medications  Lifestyle discussed: diet/exerise, sleep hygiene, stress management, hydration  Prediabetes Discussed disease and risks Discussed diet/exercise, weight management  A1C  Medication management CBC, CMP/GFR  Hyperlipidemia Continue medication Continue low cholesterol diet and exercise.  Check lipid panel.   BMI 22.0-22.9, adult Continue to recommend diet heavy in fruits and veggies and low in animal meats, cheeses, and dairy products, appropriate calorie intake Discuss exercise recommendations routinely Continue to monitor weight at each visit  CKD (chronic kidney disease) stage 3, GFR 30-59 ml/min (HCC) Increase fluids, avoid NSAIDS, monitor sugars, will monitor Refer to nephrology as needed for progression to stage 4 CKD    Over 40 minutes of exam, counseling, chart review and critical decision making was performed Future Appointments  Date Time Provider [place]  06/06/2017 10:45 AM Liane Comber, NP [facility] None  10/01/2017 11:00 AM Vicie Mutters, PA-C [facility] None     Plan:   During the course of the visit the patient was educated and counseled about appropriate screening and preventive services including:    Pneumococcal vaccine   Prevnar 13  Influenza vaccine  Td vaccine  Screening electrocardiogram  Bone densitometry screening  Colorectal cancer  screening  Diabetes screening  Glaucoma screening  Nutrition counseling   Advanced directives: requested   Subjective:  Monique Barton is a 77 y.o. female who presents for Medicare Annual Wellness Visit and 3 month follow up.   BMI is Body mass index is 21.61 kg/m., she has been working on diet and exercise. Wt Readings from Last 3 Encounters:  06/06/17 122 lb (55.3 kg)  02/21/17 125 lb (56.7 kg)  08/16/16 123 lb (55.8 kg)     Her blood pressure has been controlled at home, today their BP is BP: 138/72 She does workout. She denies chest pain, shortness of breath, dizziness.   She is on cholesterol medication and denies myalgias. Her cholesterol is at goal. The cholesterol last visit was:   Lab Results  Component Value Date   CHOL 176 02/21/2017   HDL 70 02/21/2017   LDLCALC 91 02/21/2017   TRIG 60 02/21/2017   CHOLHDL 2.5 02/21/2017    She has been working on diet and exercise for prediabetes, and denies foot ulcerations, increased appetite, nausea, paresthesia of the feet, polydipsia, polyuria, visual disturbances, vomiting and weight loss. Last A1C in the office was:  Lab Results  Component Value Date   HGBA1C 5.7 (H) 02/21/2017   Last GFR: Lab Results  Component Value Date   GFRNONAA 34 (L) 02/21/2017   Patient is on Vitamin D supplement but remains well below goal of 70-100:    Lab Results  Component Value Date   VD25OH 35 02/21/2017      Medication Review: Current Outpatient Medications on File Prior to Visit  Medication Sig Dispense Refill  . aspirin 81 MG tablet Take 81 mg by mouth daily.    . Cholecalciferol (VITAMIN D PO) Take 5,000 Units by mouth daily.    Marland Kitchen  hydrochlorothiazide (HYDRODIURIL) 25 MG tablet TAKE 0.5 TABLETS TO 1 TABLETS BY MOUTH EVERY DAY FOR BP AND FLUID 90 tablet 3  . losartan (COZAAR) 50 MG tablet TAKE 1 TABLET BY MOUTH EVERY DAY 90 tablet 3  . Magnesium 400 MG TABS Take 1 tablet by mouth daily.    . pravastatin (PRAVACHOL) 40 MG  tablet TAKE 1 TABLET BY MOUTH EVERY DAY 90 tablet 3  . sertraline (ZOLOFT) 50 MG tablet TAKE 1 TABLET BY MOUTH EVERY DAY 90 tablet 0   No current facility-administered medications on file prior to visit.     Allergies  Allergen Reactions  . Prednisone Other (See Comments)    HIGH DOSE CAUSES INSOMNIA    Current Problems (verified) Patient Active Problem List   Diagnosis Date Noted  . BMI 22.0-22.9, adult 06/05/2017  . CKD (chronic kidney disease) stage 3, GFR 30-59 ml/min (HCC) 06/05/2017  . Recurrent major depressive disorder, in full remission ([place]) 04/17/2016  . Encounter for Medicare annual wellness exam 12/28/2014  . Medication management 10/06/2013  . Essential hypertension 11/20/2012  . Hyperlipidemia 11/20/2012  . Prediabetes 11/20/2012  . Vitamin D deficiency 11/20/2012    Screening Tests Immunization History  Administered Date(s) Administered  . Influenza Nasal 11/06/2011  . Influenza, High Dose Seasonal PF 10/06/2013, 09/23/2014  . Influenza-Unspecified 10/24/2012, 10/15/2016  . Pneumococcal Conjugate-13 06/08/2014  . Pneumococcal-Unspecified 02/07/2006  . Tdap 04/04/2011  . Zoster 01/15/2009   Preventative care: Last colonoscopy: 1999 declines Cologuard June 2016  DUE - postpone to [place] Mammogram 06/2013 DUE - will schedule DEXA 05/2013 PAP remote  Tdap 2013 Influenza 2018 Prenvar 13 2016 Pneumonia 2008 Zoster 2011  Names of Other Physician/Practitioners you currently use: 1. [facility] here for primary care 2. Dr. Dolores Lory, retired, eye doctor, last visit 2015, needs appointment 3. Not seeing one, dentist, last visit remote  Patient Care Team: Unk Pinto, MD as PCP - General (Internal Medicine)  SURGICAL HISTORY She  has a past surgical history that includes Eye surgery (Bilateral, 2012). FAMILY HISTORY Her family history includes Cancer in her brother, brother, and father; Diabetes in her brother,  brother, and mother; Heart disease in her mother; Hyperlipidemia in her mother; Hypertension in her mother. SOCIAL HISTORY She  reports that she is a non-smoker but has been exposed to tobacco smoke. She has never used smokeless tobacco. She reports that she does not drink alcohol or use drugs.   MEDICARE WELLNESS OBJECTIVES: Physical activity: Current Exercise Habits: Home exercise routine, Type of exercise: strength training/weights;walking, Time (Minutes): 45, Frequency (Times/Week): 5, Weekly Exercise (Minutes/Week): 225, Intensity: Mild, Exercise limited by: None identified Cardiac risk factors: Cardiac Risk Factors include: advanced age (>22men, >57 women);hypertension;dyslipidemia Depression/mood screen:   Depression screen [facility] 2/9 06/06/2017  Decreased Interest 0  Down, Depressed, Hopeless 0  PHQ - 2 Score 0  Altered sleeping -  Tired, decreased energy -  Change in appetite -  Feeling bad or failure about yourself  -  Trouble concentrating -  Moving slowly or fidgety/restless -  Suicidal thoughts -  PHQ-9 Score -  Difficult doing work/chores -    ADLs:  In your present state of health, do you have any difficulty performing the following activities: 06/06/2017 02/24/2017  Hearing? N N  Vision? N N  Difficulty concentrating or making decisions? N N  Walking or climbing stairs? N N  Dressing or bathing? N N  Doing errands, shopping? N N  Some recent data might be hidden  Cognitive Testing  Alert? Yes  Normal Appearance?Yes  Oriented to person? Yes  Place? Yes   Time? Yes  Recall of three objects?  Yes  Can perform simple calculations? Yes  Displays appropriate judgment?Yes  Can read the correct time from a watch face?Yes  EOL planning: Does Patient Have a Medical Advance Directive?: No Would patient like information on creating a medical advance directive?: Yes (MAU/Ambulatory/Procedural Areas - Information given)  Review of Systems  Constitutional: Negative for  malaise/fatigue and weight loss.  HENT: Negative for hearing loss and tinnitus.   Eyes: Negative for blurred vision and double vision.  Respiratory: Negative for cough, sputum production, shortness of breath and wheezing.   Cardiovascular: Negative for chest pain, palpitations, orthopnea, claudication, leg swelling and PND.  Gastrointestinal: Negative for abdominal pain, blood in stool, constipation, diarrhea, heartburn, melena, nausea and vomiting.  Genitourinary: Negative.   Musculoskeletal: Negative for falls, joint pain and myalgias.  Skin: Negative for rash.  Neurological: Negative for dizziness, tingling, sensory change, weakness and headaches.  Endo/Heme/Allergies: Negative for polydipsia.  Psychiatric/Behavioral: Negative.  Negative for depression, memory loss, substance abuse and suicidal ideas. The patient is not nervous/anxious and does not have insomnia.   All other systems reviewed and are negative.    Objective:     Today's Vitals   06/06/17 1025  BP: 138/72  Pulse: (!) 56  Temp: (!) 97.3 F (36.3 C)  SpO2: 98%  Weight: 122 lb (55.3 kg)  Height: 5\' 3"  (1.6 m)   Body mass index is 21.61 kg/m.  General appearance: alert, no distress, WD/WN, female HEENT: normocephalic, sclerae anicteric, TMs pearly, nares patent, no discharge or erythema, pharynx normal Oral cavity: MMM, no lesions Neck: supple, no lymphadenopathy, no thyromegaly, no masses Heart: RRR, normal S1, S2, no murmurs Lungs: CTA bilaterally, no wheezes, rhonchi, or rales Abdomen: +bs, soft, non tender, non distended, no masses, no hepatomegaly, no splenomegaly Musculoskeletal: nontender, no swelling, no obvious deformity Extremities: no edema, no cyanosis, no clubbing Pulses: 2+ symmetric, upper and lower extremities, normal cap refill Neurological: alert, oriented x 3, CN2-12 intact, strength normal upper extremities and lower extremities, sensation normal throughout, DTRs 2+ throughout, no cerebellar  signs, gait normal Psychiatric: normal affect, behavior normal, pleasant   Medicare Attestation I have personally reviewed: The patient's medical and social history Their use of alcohol, tobacco or illicit drugs Their current medications and supplements The patient's functional ability including ADLs,fall risks, home safety risks, cognitive, and hearing and visual impairment Diet and physical activities Evidence for depression or mood disorders  The patient's weight, height, BMI, and visual acuity have been recorded in the chart.  I have made referrals, counseling, and provided education to the patient based on review of the above and I have provided the patient with a written personalized care plan for preventive services.     Izora Ribas, NP   06/06/2017

## 2017-06-06 ENCOUNTER — Encounter: Payer: Self-pay | Admitting: Adult Health

## 2017-06-06 ENCOUNTER — Ambulatory Visit (INDEPENDENT_AMBULATORY_CARE_PROVIDER_SITE_OTHER): Payer: Medicare HMO | Admitting: Adult Health

## 2017-06-06 VITALS — BP 138/72 | HR 56 | Temp 97.3°F | Ht 63.0 in | Wt 122.0 lb

## 2017-06-06 DIAGNOSIS — I10 Essential (primary) hypertension: Secondary | ICD-10-CM

## 2017-06-06 DIAGNOSIS — N183 Chronic kidney disease, stage 3 unspecified: Secondary | ICD-10-CM

## 2017-06-06 DIAGNOSIS — Z0001 Encounter for general adult medical examination with abnormal findings: Secondary | ICD-10-CM | POA: Diagnosis not present

## 2017-06-06 DIAGNOSIS — F3342 Major depressive disorder, recurrent, in full remission: Secondary | ICD-10-CM | POA: Diagnosis not present

## 2017-06-06 DIAGNOSIS — Z79899 Other long term (current) drug therapy: Secondary | ICD-10-CM

## 2017-06-06 DIAGNOSIS — R7303 Prediabetes: Secondary | ICD-10-CM | POA: Diagnosis not present

## 2017-06-06 DIAGNOSIS — E559 Vitamin D deficiency, unspecified: Secondary | ICD-10-CM | POA: Diagnosis not present

## 2017-06-06 DIAGNOSIS — R6889 Other general symptoms and signs: Secondary | ICD-10-CM

## 2017-06-06 DIAGNOSIS — E782 Mixed hyperlipidemia: Secondary | ICD-10-CM | POA: Diagnosis not present

## 2017-06-06 DIAGNOSIS — Z0000 Encounter for general adult medical examination without abnormal findings: Secondary | ICD-10-CM

## 2017-06-06 DIAGNOSIS — Z6822 Body mass index (BMI) 22.0-22.9, adult: Secondary | ICD-10-CM

## 2017-06-06 DIAGNOSIS — R69 Illness, unspecified: Secondary | ICD-10-CM | POA: Diagnosis not present

## 2017-06-06 NOTE — Patient Instructions (Addendum)
Please schedule eye exam and dental exams  [facility]  7 a.m.-6:30 p.m., Monday 7 a.m.-5 p.m., Tuesday-Friday Schedule an appointment by calling [phone].  [facility] Schedule an appointment by calling [phone].   Aim for 7+ servings of fruits and vegetables daily  65-80+ fluid ounces of water or unsweet tea for healthy kidneys  Avoid alcohol as much as possible  Limit animal fats in diet for cholesterol and heart health - choose grass fed whenever available  Aim for low stress - take time to unwind and care for your mental health  Aim for 150 min of moderate intensity exercise weekly for heart health, and weights twice weekly for bone health  Aim for 7-9 hours of sleep daily

## 2017-06-07 LAB — CBC WITH DIFFERENTIAL/PLATELET
BASOS ABS: 39 {cells}/uL (ref 0–200)
Basophils Relative: 0.8 %
EOS ABS: 309 {cells}/uL (ref 15–500)
Eosinophils Relative: 6.3 %
HEMATOCRIT: 34.8 % — AB (ref 35.0–45.0)
Hemoglobin: 11.5 g/dL — ABNORMAL LOW (ref 11.7–15.5)
LYMPHS ABS: 1548 {cells}/uL (ref 850–3900)
MCH: 29.6 pg (ref 27.0–33.0)
MCHC: 33 g/dL (ref 32.0–36.0)
MCV: 89.5 fL (ref 80.0–100.0)
MPV: 11.4 fL (ref 7.5–12.5)
Monocytes Relative: 8.4 %
NEUTROS ABS: 2592 {cells}/uL (ref 1500–7800)
NEUTROS PCT: 52.9 %
PLATELETS: 204 10*3/uL (ref 140–400)
RBC: 3.89 10*6/uL (ref 3.80–5.10)
RDW: 12.8 % (ref 11.0–15.0)
Total Lymphocyte: 31.6 %
WBC: 4.9 10*3/uL (ref 3.8–10.8)
WBCMIX: 412 {cells}/uL (ref 200–950)

## 2017-06-07 LAB — COMPLETE METABOLIC PANEL WITH GFR
AG Ratio: 1.7 (calc) (ref 1.0–2.5)
ALKALINE PHOSPHATASE (APISO): 68 U/L (ref 33–130)
ALT: 11 U/L (ref 6–29)
AST: 18 U/L (ref 10–35)
Albumin: 4.3 g/dL (ref 3.6–5.1)
BUN/Creatinine Ratio: 20 (calc) (ref 6–22)
BUN: 27 mg/dL — AB (ref 7–25)
CALCIUM: 9.6 mg/dL (ref 8.6–10.4)
CO2: 27 mmol/L (ref 20–32)
CREATININE: 1.34 mg/dL — AB (ref 0.60–0.93)
Chloride: 103 mmol/L (ref 98–110)
GFR, EST NON AFRICAN AMERICAN: 38 mL/min/{1.73_m2} — AB (ref >=60)
GFR, Est African American: 44 mL/min/{1.73_m2} — ABNORMAL LOW (ref >=60)
GLOBULIN: 2.6 g/dL (ref 1.9–3.7)
GLUCOSE: 84 mg/dL (ref 65–99)
Potassium: 4.2 mmol/L (ref 3.5–5.3)
SODIUM: 143 mmol/L (ref 135–146)
Total Bilirubin: 0.5 mg/dL (ref 0.2–1.2)
Total Protein: 6.9 g/dL (ref 6.1–8.1)

## 2017-06-07 LAB — LIPID PANEL
CHOL/HDL RATIO: 2.9 (calc) (ref <5.0)
CHOLESTEROL: 180 mg/dL (ref <200)
HDL: 63 mg/dL (ref >50)
LDL Cholesterol (Calc): 101 mg/dL (calc) — ABNORMAL HIGH
NON-HDL CHOLESTEROL (CALC): 117 mg/dL (ref <130)
TRIGLYCERIDES: 72 mg/dL (ref <150)

## 2017-06-07 LAB — HEMOGLOBIN A1C
EAG (MMOL/L): 6.3 (calc)
Hgb A1c MFr Bld: 5.6 % of total Hgb (ref <5.7)
Mean Plasma Glucose: 114 (calc)

## 2017-06-07 LAB — VITAMIN D 25 HYDROXY (VIT D DEFICIENCY, FRACTURES): VIT D 25 HYDROXY: 42 ng/mL (ref 30–100)

## 2017-06-07 LAB — TSH: TSH: 0.78 mIU/L (ref 0.40–4.50)

## 2017-07-27 ENCOUNTER — Other Ambulatory Visit: Payer: Self-pay | Admitting: Internal Medicine

## 2017-08-26 ENCOUNTER — Encounter: Payer: Self-pay | Admitting: Physician Assistant

## 2017-09-26 NOTE — Progress Notes (Signed)
Patient ID: Monique Barton, female   DOB: 02/10/1940, 77 y.o.   MRN: [id number]   CPE AND OV  Assessment and Plan:   Essential hypertension - continue medications, DASH diet, exercise and monitor at home. Call if greater than 130/80.  -     CBC with Differential/Platelet -     BASIC METABOLIC PANEL WITH GFR -     Hepatic function panel -     TSH  Hyperlipidemia -continue medications, check lipids, decrease fatty foods, increase activity.  -     Lipid panel  Prediabetes Discussed general issues about diabetes pathophysiology and management., Educational material distributed., Suggested low cholesterol diet., Encouraged aerobic exercise., Discussed foot care., Reminded to get yearly retinal exam.  Medication management -     Magnesium  Vitamin D deficiency Continue supplement  Encounter for Medicare annual wellness exam Get mgm and DEXA- states she will call  Recurrent major depressive disorder, in full remission ([place]) Continue zoloft  Estrogen deficiency Will get with MGM  BMI 22.0-22.9, adult  Screen for colon cancer -     Cologuard  Basal cell carcinoma (BCC) of skin of other part of face -     Ambulatory referral to Dermatology  Otalgia of both ears Bilateral impacted cerumen - stop using Qtips, irrigation used in the office without complications, use OTC drops/oil at home to prevent reoccurence    Future Appointments  Date Time Provider [place]  06/18/2018 10:45 AM Liane Comber, NP [facility] None  10/09/2018 10:00 AM Vicie Mutters, PA-C [facility] None     Subjective:   Monique Barton is a 77 y.o.  female who presents for complete physical.    She has had elevated blood pressure. Her blood pressure has been controlled at home, & today their BP is BP: 130/74 She does not workout.  She is doing line dancing and sometimes gardens.  She denies chest pain, shortness of breath, dizziness.   She is on cholesterol medication, pravastatin daily and  denies myalgias. Her cholesterol is at goal. The cholesterol last visit was:  Lab Results  Component Value Date   CHOL 180 06/06/2017   HDL 63 06/06/2017   LDLCALC 101 (H) 06/06/2017   TRIG 72 06/06/2017   CHOLHDL 2.9 06/06/2017   She has had prediabetes. She has not been working on diet and exercise for prediabetes, and denies foot ulcerations, hyperglycemia, hypoglycemia , increased appetite, nausea, paresthesia of the feet, polydipsia, polyuria, visual disturbances, vomiting and weight loss. Last A1C in the office was:  Lab Results  Component Value Date   HGBA1C 5.6 06/06/2017   She is on zoloft for depression, which is in remission.   Patient is on Vitamin D supplement.   Lab Results  Component Value Date   VD25OH 42 06/06/2017     BMI is Body mass index is 22.11 kg/m., she is working on diet and exercise. Wt Readings from Last 3 Encounters:  10/01/17 124 lb 12.8 oz (56.6 kg)  06/06/17 122 lb (55.3 kg)  02/21/17 125 lb (56.7 kg)     Names of Other Physician/Practitioners you currently use: 1. [facility] here for primary care 2. Dr. Dolores Lory, retired, eye doctor, last visit 2015 needs OV 3. Not seeing one, dentist, last visit remote  Patient Care Team: Unk Pinto, MD as PCP - General (Internal Medicine)  Medication Review: Current Outpatient Medications on File Prior to Visit  Medication Sig Dispense Refill  . aspirin 81 MG tablet Take  81 mg by mouth daily.    . Cholecalciferol (VITAMIN D PO) Take 5,000 Units by mouth daily.    . hydrochlorothiazide (HYDRODIURIL) 25 MG tablet TAKE 0.5 TABLETS TO 1 TABLETS BY MOUTH EVERY DAY FOR BP AND FLUID 90 tablet 3  . losartan (COZAAR) 50 MG tablet TAKE 1 TABLET BY MOUTH EVERY DAY 90 tablet 3  . Magnesium 400 MG TABS Take 1 tablet by mouth daily.    . pravastatin (PRAVACHOL) 40 MG tablet TAKE 1 TABLET BY MOUTH EVERY DAY 90 tablet 3  . sertraline (ZOLOFT) 50 MG tablet TAKE 1 TABLET BY MOUTH  EVERY DAY 90 tablet 0   No current facility-administered medications on file prior to visit.     Current Problems (verified) Patient Active Problem List   Diagnosis Date Noted  . BMI 22.0-22.9, adult 06/05/2017  . CKD (chronic kidney disease) stage 3, GFR 30-59 ml/min (HCC) 06/05/2017  . Recurrent major depressive disorder, in full remission ([place]) 04/17/2016  . Encounter for Medicare annual wellness exam 12/28/2014  . Medication management 10/06/2013  . Essential hypertension 11/20/2012  . Hyperlipidemia 11/20/2012  . Prediabetes 11/20/2012  . Vitamin D deficiency 11/20/2012    Screening Tests Immunization History  Administered Date(s) Administered  . Influenza Nasal 11/06/2011  . Influenza, High Dose Seasonal PF 10/06/2013, 09/23/2014  . Influenza-Unspecified 10/24/2012, 10/15/2016  . Pneumococcal Conjugate-13 06/08/2014  . Pneumococcal-Unspecified 02/07/2006  . Tdap 04/04/2011  . Zoster 01/15/2009   Preventative care: Last colonoscopy: 1999 declines Cologuard June 2016 negative WILL SEND NEW ONE Mammogram 06/2013 DUE DEXA 05/2013 PAP remote  Tdap 2013 Influenza 2018 at [facility] Prenvar 13 2016 Pneumonia 2008 Zoster 2011  History reviewed: allergies, current medications, past family history, past medical history, past social history, past surgical history and problem list  Allergies Allergies  Allergen Reactions  . Prednisone Other (See Comments)    HIGH DOSE CAUSES INSOMNIA    SURGICAL HISTORY She  has a past surgical history that includes Eye surgery (Bilateral, 2012). FAMILY HISTORY Her family history includes Cancer in her brother, brother, and father; Diabetes in her brother, brother, and mother; Heart disease in her mother; Hyperlipidemia in her mother; Hypertension in her mother. SOCIAL HISTORY She  reports that she is a non-smoker but has been exposed to tobacco smoke. She has never used smokeless tobacco. She reports that she does not drink alcohol  or use drugs.     Review of Systems  Constitutional: Negative for chills, fever, malaise/fatigue and weight loss.  HENT: Negative for congestion, ear pain, nosebleeds and sore throat.   Eyes: Negative.   Respiratory: Negative for cough, shortness of breath and wheezing.   Cardiovascular: Negative for chest pain, palpitations and leg swelling.  Gastrointestinal: Negative for blood in stool, constipation, diarrhea, heartburn, melena, nausea and vomiting.  Genitourinary: Negative for dysuria, frequency and urgency.  Neurological: Negative for dizziness, sensory change, loss of consciousness and headaches.  Psychiatric/Behavioral: Negative for depression. The patient is not nervous/anxious and does not have insomnia.      Objective:     BP 130/74   Pulse (!) 57   Temp 98 F (36.7 C)   Resp 16   Ht 5\' 3"  (1.6 m)   Wt 124 lb 12.8 oz (56.6 kg)   SpO2 98%   BMI 22.11 kg/m   General Appearance: Well nourished, alert, WD/WN, female and in no apparent distress. Eyes: PERRLA, EOMs, conjunctiva no swelling or erythema, normal fundi and vessels. Sinuses: No frontal/maxillary tenderness ENT/Mouth: EACs patent /  TMs  nl. Nares clear without erythema, swelling, mucoid exudates. Oral hygiene is good. No erythema, swelling, or exudate. Tongue normal, non-obstructing. Tonsils not swollen or erythematous. Hearing normal.  Neck: Supple, thyroid normal. No bruits, nodes or JVD. Respiratory: Respiratory effort normal.  BS equal and clear bilateral without rales, rhonci, wheezing or stridor. Cardio: Heart sounds are normal with regular rate and rhythm and no murmurs, rubs or gallops. Peripheral pulses are normal and equal bilaterally without edema. No aortic or femoral bruits. Chest: symmetric with normal excursions and percussion. Breasts: Symmetric, without lumps, nipple discharge, retractions, or fibrocystic changes.  Abdomen: Flat, soft  with nl bowel sounds. Nontender, no guarding, rebound,  hernias, masses, or organomegaly.  Lymphatics: Non tender without lymphadenopathy.  Genitourinary:  Musculoskeletal: Full ROM all peripheral extremities, joint stability, 5/5 strength, and normal gait. Skin: left cheek with erythematous scaly nodule with telangiectasias Warm and dry without rashes, lesions, cyanosis, clubbing or  ecchymosis.  Neuro: Cranial nerves intact, reflexes equal bilaterally. Normal muscle tone, no cerebellar symptoms. Sensation intact.  Pysch: Alert and oriented X 3, normal affect, Insight and Judgment appropriate.     Vicie Mutters, PA-C   10/01/2017

## 2017-10-01 ENCOUNTER — Encounter: Payer: Self-pay | Admitting: Physician Assistant

## 2017-10-01 ENCOUNTER — Ambulatory Visit (INDEPENDENT_AMBULATORY_CARE_PROVIDER_SITE_OTHER): Payer: Medicare HMO | Admitting: Physician Assistant

## 2017-10-01 VITALS — BP 130/74 | HR 57 | Temp 98.0°F | Resp 16 | Ht 63.0 in | Wt 124.8 lb

## 2017-10-01 DIAGNOSIS — F3342 Major depressive disorder, recurrent, in full remission: Secondary | ICD-10-CM

## 2017-10-01 DIAGNOSIS — Z0000 Encounter for general adult medical examination without abnormal findings: Secondary | ICD-10-CM | POA: Diagnosis not present

## 2017-10-01 DIAGNOSIS — E559 Vitamin D deficiency, unspecified: Secondary | ICD-10-CM

## 2017-10-01 DIAGNOSIS — Z6822 Body mass index (BMI) 22.0-22.9, adult: Secondary | ICD-10-CM

## 2017-10-01 DIAGNOSIS — Z79899 Other long term (current) drug therapy: Secondary | ICD-10-CM

## 2017-10-01 DIAGNOSIS — C44319 Basal cell carcinoma of skin of other parts of face: Secondary | ICD-10-CM

## 2017-10-01 DIAGNOSIS — R7303 Prediabetes: Secondary | ICD-10-CM

## 2017-10-01 DIAGNOSIS — I10 Essential (primary) hypertension: Secondary | ICD-10-CM | POA: Diagnosis not present

## 2017-10-01 DIAGNOSIS — H9203 Otalgia, bilateral: Secondary | ICD-10-CM

## 2017-10-01 DIAGNOSIS — E782 Mixed hyperlipidemia: Secondary | ICD-10-CM

## 2017-10-01 DIAGNOSIS — Z136 Encounter for screening for cardiovascular disorders: Secondary | ICD-10-CM

## 2017-10-01 DIAGNOSIS — R350 Frequency of micturition: Secondary | ICD-10-CM

## 2017-10-01 DIAGNOSIS — N183 Chronic kidney disease, stage 3 unspecified: Secondary | ICD-10-CM

## 2017-10-01 DIAGNOSIS — Z1211 Encounter for screening for malignant neoplasm of colon: Secondary | ICD-10-CM

## 2017-10-01 DIAGNOSIS — E2839 Other primary ovarian failure: Secondary | ICD-10-CM

## 2017-10-01 DIAGNOSIS — H6123 Impacted cerumen, bilateral: Secondary | ICD-10-CM

## 2017-10-01 NOTE — Patient Instructions (Addendum)
HOW TO SCHEDULE A MAMMOGRAM  [facility]  7 a.m.-6:30 p.m., Monday 7 a.m.-5 p.m., Tuesday-Friday Schedule an appointment by calling [phone].  VERY IMPORTANT TO GET THIS  ALLERGY MEDICATIONS OVER THE COUNTER  Please pick one of the over the counter allergy medications below and take it once daily for allergies.  Claritin or loratadine cheapest but likely the weakest  Zyrtec or certizine at night because it can make you sleepy The strongest is allegra or fexafinadine  Cheapest at [employer], [employer], [employer]  COLD INFORMATION  Try just the allergy pill and prednisone for a few days, you always want to give your body 10 days to fight off infection with help before taking an anabiotic.   BEWARE ANTIBIOTICS Antibiotics have been linked with colon infections, resistance and newest theory is colon cancer in 40-50 year olds. So it is VERY important to try to avoid them, antibiotics are NOT risk free medications.   If you are not feeling better make an office visit OR CONTACT us.   Here is more info below HOW TO TREAT VIRAL COUGH AND COLD SYMPTOMS:  -Symptoms usually last at least 1 week with the worst symptoms being around day 4.  - colds usually start with a sore throat and end with a cough, and the cough can take 2 weeks to get better.  -No antibiotics are needed for colds, flu, sore throats, cough, bronchitis UNLESS symptoms are longer than 7 days OR if you are getting better then get drastically worse.  -There are a lot of combination medications (Dayquil, Nyquil, Vicks 44, tyelnol cold and sinus, ETC). Please look at the ingredients on the back so that you are treating the correct symptoms and not doubling up on medications/ingredients.    Medicines you can use  Nasal congestion  Little Remedies saline spray (aerosol/mist)- can try this, it is in the kids section - pseudoephedrine (Sudafed)- behind the counter, do not use if you have high blood  pressure, medicine that have -D in them.  - phenylephrine (Sudafed PE) -Dextormethorphan + chlorpheniramine (Coridcidin HBP)- okay if you have high blood pressure -Oxymetazoline (Afrin) nasal spray- LIMIT to 3 days -Saline nasal spray -Neti pot (used distilled or bottled water)  Ear pain/congestion  -pseudoephedrine (sudafed) - Nasonex/flonase nasal spray  Fever  -Acetaminophen (Tyelnol) -Ibuprofen (Advil, motrin, aleve)  Sore Throat  -Acetaminophen (Tyelnol) -Ibuprofen (Advil, motrin, aleve) -Drink a lot of water -Gargle with salt water - Rest your voice (don't talk) -Throat sprays -Cough drops  Body Aches  -Acetaminophen (Tyelnol) -Ibuprofen (Advil, motrin, aleve)  Headache  -Acetaminophen (Tyelnol) -Ibuprofen (Advil, motrin, aleve) - Exedrin, Exedrin Migraine  Allergy symptoms (cough, sneeze, runny nose, itchy eyes) -Claritin or loratadine cheapest but likely the weakest  -Zyrtec or certizine at night because it can make you sleepy -The strongest is allegra or fexafinadine  Cheapest at [employer], [employer], [employer]  Cough  -Dextromethorphan (Delsym)- medicine that has DM in it -Guafenesin (Mucinex/Robitussin) - cough drops - drink lots of water  Chest Congestion  -Guafenesin (Mucinex/Robitussin)  Red Itchy Eyes  - Naphcon-A  Upset Stomach  - Bland diet (nothing spicy, greasy, fried, and high acid foods like tomatoes, oranges, berries) -OKAY- cereal, bread, soup, crackers, rice -Eat smaller more frequent meals -reduce caffeine, no alcohol -Loperamide (Imodium-AD) if diarrhea -Prevacid for heart burn  General health when sick  -Hydration -wash your hands frequently -keep surfaces clean -change pillow cases and sheets often -Get fresh air but do not exercise strenuously -  Vitamin D, double up on it - Vitamin C -Zinc    COLOGUARD INFORMATION   Cologuard is an easy to use noninvasive colon cancer screening test based on the latest advances in stool DNA  science.   Colon cancer is 3rd most diagnosed cancer and 2nd leading cause of death in both men and women 86 years of age and older despite being one of the most preventable and treatable cancers if found early.  4 of out 5 people diagnosed with colon cancer have NO prior family history.  When caught EARLY 90% of colon cancer is curable.   More than 92% of cologuard patients have NO out of pocket cost for screening however only your insurer can confirm how Cologuard would be covered for you. [facility] has a team of specialist that can help you contact your insurer and ask the right questions. Please call [phone] so they can help.   You will receive a short call from [place] support center at [employer], when you receive a call they will say they are from [place],  to confirm your mailing address and give you more information.  When they calll you, it will appear on the caller ID as "[employer]" or in some cases only this number will appear, [phone].   [employer] will ship your collection kit directly to you. You will collect a single stool sample in the privacy of your own home, no special preparation required. You will return the kit via [facility] pre-paid shipping or pick-up, in the same box it arrived in. Then I will contact you to discuss your results after I receive them from the laboratory.   If you have any questions or concerns, [facility] Customer Support Specialist are available 24 hours a day, 7 days a week at [phone] or go to [email or web address].    Varicella (Chickenpox) Vaccine: What You Need to Know 1. Why get vaccinated? Varicella (also called chickenpox) is a very contagious viral disease. It is caused by the varicella zoster virus. Chickenpox is usually mild, but it can be serious in infants under 23 months of age, adolescents, adults, pregnant women, and people with weakened immune systems. Chickenpox causes an  itchy rash that usually lasts about a week. It can also cause:  fever  tiredness  loss of appetite  headache  More serious complications can include:  skin infections  infection of the lungs (pneumonia)  inflammation of blood vessels  swelling of the brain and/or spinal cord coverings (encephalitis or meningitis)  blood stream, bone, or joint infections  Some people get so sick that they need to be hospitalized. It doesn't happen often, but people can die from chickenpox. Before varicella vaccine, almost everyone in the [place] got chickenpox, an average of 4 million people each year. Children who get chickenpox usually miss at least 5 or 6 days of school or childcare. Some people who get chickenpox get a painful rash called shingles (also known as herpes zoster) years later. Chickenpox can spread easily from an infected person to anyone who has not had chickenpox and has not gotten chickenpox vaccine. 2. Chickenpox vaccine Children 12 months through 38 years of age should get 2 doses of chickenpox vaccine, usually:  First dose: 12 through 54 months of age  Second dose: 74 through 77 years of age  People 79 years of age or older who didn't get the vaccine when they were younger, and have never had chickenpox, should get 2 doses at  least 28 days apart. A person who previously received only one dose of chickenpox vaccine should receive a second dose to complete the series. The second dose should be given at least 3 months after the first dose for those younger than 13 years, and at least 28 days after the first dose for those 32 years of age or older. There are no known risks to getting chickenpox vaccine at the same time as other vaccines. There is a combination vaccine called MMRV that contains both chickenpox and MMR vaccines. MMRV is an option for some children 12 months through 10 years of age. There is a separate Vaccine Information Statement for MMRV. Your health care  provider can give you more information. 3. Some people should not get this vaccine Tell your vaccine provider if the person getting the vaccine:  Has any severe, life-threatening allergies. A person who has ever had a life-threatening allergic reaction after a dose of chickenpox vaccine, or has a severe allergy to any part of this vaccine, may be advised not to be vaccinated. Ask your health care provider if you want information about vaccine components.  Is pregnant, or thinks she might be pregnant. Pregnant women should wait to get chickenpox vaccine until after they are no longer pregnant. Women should avoid getting pregnant for at least 1 month after getting chickenpox vaccine.  Has a weakened immune system due to disease (such as cancer or HIV/AIDS) or medical treatments (such as radiation, immunotherapy, steroids, or chemotherapy).  Has a parent, brother, or sister with a history of immune system problems.  Is taking salicylates (such as aspirin). People should avoid using salicylates for 6 weeks after getting varicella vaccine.  Has recently had a blood transfusion or received other blood products. You might be advised to postpone chickenpox vaccination for 3 months or more.  Has tuberculosis.  Has gotten any other vaccines in the past 4 weeks. Live vaccines given too close together might not work as well.  Is not feeling well. A mild illness, such as a cold, is usually not a reason to postpone a vaccination. Someone who is moderately or severely ill should probably wait. Your doctor can advise you.  4. Risks of a vaccine reaction With any medicine, including vaccines, there is a chance of reactions. These are usually mild and go away on their own, but serious reactions are also possible. Getting chickenpox vaccine is much safer than getting chickenpox disease. Most people who get chickenpox vaccine do not have any problems with it. After chickenpox vaccination, a person might  experience: Minor events:  Sore arm from the injection  Fever  Redness or rash at the injection site If these events happen, they usually begin within 2 weeks after the shot. They occur less often after the second dose. More serious events following chickenpox vaccination are rare. They can include:  Seizure (jerking or staring) often associated with fever  Infection of the lungs (pneumonia) or the brain and spinal cord coverings (meningitis)  Rash all over the body  A person who develops a rash after chickenpox vaccination might be able to spread the varicella vaccine virus to an unprotected person. Even though this happens very rarely, anyone who gets a rash should stay away from people with weakened immune systems and unvaccinated infants until the rash goes away. Talk with your health care provider to learn more. Other things that could happen after this vaccine:  People sometimes faint after medical procedures, including vaccination. Sitting or lying down  for about 15 minutes can help prevent fainting and injuries caused by a fall. Tell your doctor if you feel dizzy or have vision changes or ringing in the ears.  Some people get shoulder pain that can be more severe and longer-lasting than routine soreness that can follow injections. This happens very rarely.  Any medication can cause a severe allergic reaction. Such reactions to a vaccine are estimated at about 1 in a million doses, and would happen within a few minutes to a few hours after the vaccination. As with any medicine, there is a very remote chance of a vaccine causing a serious injury or death. The safety of vaccines is always being monitored. For more information, visit: http://www.aguilar.org/ 5. What if there is a serious problem? What should I look for?  Look for anything that concerns you, such as signs of a severe allergic reaction, very high fever, or unusual behavior. Signs of a severe allergic reaction can  include hives, swelling of the face and throat, difficulty breathing, a fast heartbeat, dizziness, and weakness. These would usually start a few minutes to a few hours after the vaccination. What should I do?  If you think it is a severe allergic reaction or other emergency that can't wait, call 9-1-1 and get to the nearest hospital. Otherwise, call your health care provider. Afterward, the reaction should be reported to the Vaccine Adverse Event Reporting System (VAERS). Your doctor should file this report, or you can do it yourself through the VAERS web site atwww.vaers.SamedayNews.es, or by calling (337) 581-9048. VAERS does not give medical advice. 6. The National Vaccine Injury Compensation Program The [employer] Vaccine Injury Compensation Program (VICP) is a federal program that was created to compensate people who may have been injured by certain vaccines. Persons who believe they may have been injured by a vaccine can learn about the program and about filing a claim by calling (571) 066-3951 or visiting the [facility] website at GoldCloset.com.ee. There is a time limit to file a claim for compensation. 7. How can I learn more?  Ask your health care provider. He or she can give you the vaccine package insert or suggest other sources of information.  Call your local or state health department.  Contact the Centers for Disease Control and Prevention (CDC): ? Call 580-028-4452 (1-800-CDC-INFO) or ? Visit the CDC's website at http://hunter.com/ CDC Vaccine Information Statement (VIS) Varicella Vaccine (02/27/2016) This information is not intended to replace advice given to you by your health care provider. Make sure you discuss any questions you have with your health care provider. Document Released: 10/26/2005 Document Revised: 03/13/2016 Document Reviewed: 03/13/2016 Elsevier Interactive Patient Education  [employer].

## 2017-10-02 ENCOUNTER — Other Ambulatory Visit: Payer: Self-pay

## 2017-10-02 DIAGNOSIS — R350 Frequency of micturition: Secondary | ICD-10-CM | POA: Diagnosis not present

## 2017-10-02 LAB — URINALYSIS, ROUTINE W REFLEX MICROSCOPIC
BILIRUBIN URINE: NEGATIVE
Bacteria, UA: NONE SEEN /HPF
GLUCOSE, UA: NEGATIVE
Hyaline Cast: NONE SEEN /LPF
Ketones, ur: NEGATIVE
Nitrite: NEGATIVE
PROTEIN: NEGATIVE
RBC / HPF: NONE SEEN /HPF (ref 0–2)
SQUAMOUS EPITHELIAL / LPF: NONE SEEN /HPF (ref <=5)
Specific Gravity, Urine: 1.01 (ref 1.001–1.03)
pH: 6 (ref 5.0–8.0)

## 2017-10-02 LAB — CBC WITH DIFFERENTIAL/PLATELET
BASOS ABS: 40 {cells}/uL (ref 0–200)
Basophils Relative: 0.5 %
Eosinophils Absolute: 142 cells/uL (ref 15–500)
Eosinophils Relative: 1.8 %
HEMATOCRIT: 36.8 % (ref 35.0–45.0)
HEMOGLOBIN: 11.9 g/dL (ref 11.7–15.5)
LYMPHS ABS: 1762 {cells}/uL (ref 850–3900)
MCH: 28.8 pg (ref 27.0–33.0)
MCHC: 32.3 g/dL (ref 32.0–36.0)
MCV: 89.1 fL (ref 80.0–100.0)
MPV: 11.6 fL (ref 7.5–12.5)
Monocytes Relative: 11.1 %
NEUTROS ABS: 5080 {cells}/uL (ref 1500–7800)
NEUTROS PCT: 64.3 %
Platelets: 202 10*3/uL (ref 140–400)
RBC: 4.13 10*6/uL (ref 3.80–5.10)
RDW: 12.9 % (ref 11.0–15.0)
Total Lymphocyte: 22.3 %
WBC: 7.9 10*3/uL (ref 3.8–10.8)
WBCMIX: 877 {cells}/uL (ref 200–950)

## 2017-10-02 LAB — COMPLETE METABOLIC PANEL WITH GFR
AG RATIO: 1.8 (calc) (ref 1.0–2.5)
ALBUMIN MSPROF: 4.4 g/dL (ref 3.6–5.1)
ALT: 11 U/L (ref 6–29)
AST: 20 U/L (ref 10–35)
Alkaline phosphatase (APISO): 71 U/L (ref 33–130)
BUN / CREAT RATIO: 14 (calc) (ref 6–22)
BUN: 21 mg/dL (ref 7–25)
CO2: 34 mmol/L — ABNORMAL HIGH (ref 20–32)
Calcium: 9.7 mg/dL (ref 8.6–10.4)
Chloride: 101 mmol/L (ref 98–110)
Creat: 1.5 mg/dL — ABNORMAL HIGH (ref 0.60–0.93)
GFR, EST NON AFRICAN AMERICAN: 33 mL/min/{1.73_m2} — AB (ref >=60)
GFR, Est African American: 39 mL/min/{1.73_m2} — ABNORMAL LOW (ref >=60)
GLOBULIN: 2.5 g/dL (ref 1.9–3.7)
Glucose, Bld: 97 mg/dL (ref 65–99)
POTASSIUM: 5 mmol/L (ref 3.5–5.3)
SODIUM: 142 mmol/L (ref 135–146)
Total Bilirubin: 0.4 mg/dL (ref 0.2–1.2)
Total Protein: 6.9 g/dL (ref 6.1–8.1)

## 2017-10-02 LAB — LIPID PANEL
CHOLESTEROL: 168 mg/dL (ref <200)
HDL: 57 mg/dL (ref >50)
LDL CHOLESTEROL (CALC): 92 mg/dL
NON-HDL CHOLESTEROL (CALC): 111 mg/dL (ref <130)
Total CHOL/HDL Ratio: 2.9 (calc) (ref <5.0)
Triglycerides: 101 mg/dL (ref <150)

## 2017-10-02 LAB — TSH: TSH: 0.61 mIU/L (ref 0.40–4.50)

## 2017-10-02 LAB — MAGNESIUM: MAGNESIUM: 2.1 mg/dL (ref 1.5–2.5)

## 2017-10-02 LAB — MICROALBUMIN / CREATININE URINE RATIO
Creatinine, Urine: 67 mg/dL (ref 20–275)
MICROALB/CREAT RATIO: 36 ug/mg{creat} — AB (ref <30)
Microalb, Ur: 2.4 mg/dL

## 2017-10-02 NOTE — Addendum Note (Signed)
Addended by: Eulis Canner on: 10/02/2017 08:42 AM   Modules accepted: Orders

## 2017-10-03 LAB — URINE CULTURE
MICRO NUMBER:: 91122821
Result:: NO GROWTH
SPECIMEN QUALITY: ADEQUATE

## 2017-10-08 ENCOUNTER — Other Ambulatory Visit: Payer: Self-pay | Admitting: Physician Assistant

## 2017-10-08 DIAGNOSIS — Z1231 Encounter for screening mammogram for malignant neoplasm of breast: Secondary | ICD-10-CM

## 2017-10-19 ENCOUNTER — Other Ambulatory Visit: Payer: Self-pay | Admitting: Internal Medicine

## 2017-10-25 DIAGNOSIS — D2239 Melanocytic nevi of other parts of face: Secondary | ICD-10-CM | POA: Diagnosis not present

## 2017-10-25 DIAGNOSIS — L570 Actinic keratosis: Secondary | ICD-10-CM | POA: Diagnosis not present

## 2017-10-30 DIAGNOSIS — R69 Illness, unspecified: Secondary | ICD-10-CM | POA: Diagnosis not present

## 2017-11-29 ENCOUNTER — Ambulatory Visit: Payer: Private Health Insurance - Indemnity

## 2017-11-29 ENCOUNTER — Other Ambulatory Visit: Payer: Private Health Insurance - Indemnity

## 2018-01-15 ENCOUNTER — Other Ambulatory Visit: Payer: Self-pay | Admitting: Internal Medicine

## 2018-01-17 ENCOUNTER — Other Ambulatory Visit: Payer: Self-pay | Admitting: Internal Medicine

## 2018-01-19 ENCOUNTER — Other Ambulatory Visit: Payer: Self-pay | Admitting: Internal Medicine

## 2018-01-19 DIAGNOSIS — I10 Essential (primary) hypertension: Secondary | ICD-10-CM

## 2018-02-15 ENCOUNTER — Other Ambulatory Visit: Payer: Self-pay | Admitting: Internal Medicine

## 2018-02-15 DIAGNOSIS — I10 Essential (primary) hypertension: Secondary | ICD-10-CM

## 2018-03-02 ENCOUNTER — Other Ambulatory Visit: Payer: Self-pay | Admitting: Internal Medicine

## 2018-03-14 ENCOUNTER — Other Ambulatory Visit: Payer: Self-pay | Admitting: Internal Medicine

## 2018-03-14 DIAGNOSIS — I10 Essential (primary) hypertension: Secondary | ICD-10-CM

## 2018-04-10 ENCOUNTER — Other Ambulatory Visit: Payer: Self-pay | Admitting: Internal Medicine

## 2018-04-11 ENCOUNTER — Other Ambulatory Visit: Payer: Self-pay | Admitting: Internal Medicine

## 2018-04-11 DIAGNOSIS — I10 Essential (primary) hypertension: Secondary | ICD-10-CM

## 2018-05-07 ENCOUNTER — Other Ambulatory Visit: Payer: Self-pay | Admitting: Physician Assistant

## 2018-05-07 DIAGNOSIS — I10 Essential (primary) hypertension: Secondary | ICD-10-CM

## 2018-06-02 ENCOUNTER — Other Ambulatory Visit: Payer: Self-pay | Admitting: Adult Health

## 2018-06-02 DIAGNOSIS — I10 Essential (primary) hypertension: Secondary | ICD-10-CM

## 2018-06-16 DIAGNOSIS — M8580 Other specified disorders of bone density and structure, unspecified site: Secondary | ICD-10-CM | POA: Insufficient documentation

## 2018-06-16 NOTE — Progress Notes (Signed)
MEDICARE ANNUAL WELLNESS VISIT AND FOLLOW UP  Assessment:   Diagnoses and all orders for this visit:  Encounter for Medicare annual wellness exam Patient will schedule mammogram (# given) with bone denstiy, ophth follow up, dental follow up  Essential hypertension Continue medication Monitor blood pressure at home; call if consistently over 130/80 Continue DASH diet.   Reminder to go to the ER if any CP, SOB, nausea, dizziness, severe HA, changes vision/speech, left arm numbness and tingling and jaw pain.  Vitamin D deficiency Continue supplementation Check vitamin D level  Recurrent major depressive disorder, in full remission ([place]) Continue medications  Lifestyle discussed: diet/exerise, sleep hygiene, stress management, hydration  Prediabetes Discussed disease and risks Discussed diet/exercise, weight management  A1C  Medication management CBC, CMP/GFR, magnesium   Hyperlipidemia Continue medication Continue low cholesterol diet and exercise.  Check lipid panel.   BMI 22.0-22.9, adult Continue to recommend diet heavy in fruits and veggies and low in animal meats, cheeses, and dairy products, appropriate calorie intake Discuss exercise recommendations routinely Continue to monitor weight at each visit  CKD (chronic kidney disease) stage 3, GFR 30-59 ml/min (HCC) Increase fluids, avoid NSAIDS, monitor sugars, will monitor Refer to nephrology as needed for progression to stage 4 CKD   Osteopenia - get dexa- schedule with mammogram, continue Vit D and Ca, weight bearing exercises  Colon cancer screening cologuard reordered - never received  Over 40 minutes of exam, counseling, chart review and critical decision making was performed Future Appointments  Date Time Provider [place]  10/09/2018 10:00 AM Vicie Mutters, PA-C [id number] None     Plan:   During the course of the visit the patient was educated and counseled about appropriate screening  and preventive services including:    Pneumococcal vaccine   Prevnar 13  Influenza vaccine  Td vaccine  Screening electrocardiogram  Bone densitometry screening  Colorectal cancer screening  Diabetes screening  Glaucoma screening  Nutrition counseling   Advanced directives: requested   Subjective:  Monique Barton is a 78 y.o. female who presents for Medicare Annual Wellness Visit and 3 month follow up.   She has hx of depression and remains in remission on zoloft 50 mg daily.   BMI is Body mass index is 22.85 kg/m., she has been working on diet and exercise. She is doing line dancing and sometimes gardens.  Wt Readings from Last 3 Encounters:  06/18/18 129 lb (58.5 kg)  10/01/17 124 lb 12.8 oz (56.6 kg)  06/06/17 122 lb (55.3 kg)     Her blood pressure has been controlled at home, today their BP is BP: 130/60 She does workout. She denies chest pain, shortness of breath, dizziness.   She is on cholesterol medication and denies myalgias. Her cholesterol is at goal. The cholesterol last visit was:   Lab Results  Component Value Date   CHOL 168 10/01/2017   HDL 57 10/01/2017   LDLCALC 92 10/01/2017   TRIG 101 10/01/2017   CHOLHDL 2.9 10/01/2017    She has been working on diet and exercise for glucose management (recent hx of prediabetes), and denies foot ulcerations, increased appetite, nausea, paresthesia of the feet, polydipsia, polyuria, visual disturbances, vomiting and weight loss. Last A1C in the office was:  Lab Results  Component Value Date   HGBA1C 5.6 06/06/2017   Last GFR:  Lab Results  Component Value Date   GFRNONAA 33 (L) 10/01/2017  She drinks ~2 bottles daily.   Patient is on Vitamin D supplement  but remains well below goal of 60-100:    Lab Results  Component Value Date   VD25OH 42 06/06/2017      Medication Review: Current Outpatient Medications on File Prior to Visit  Medication Sig Dispense Refill  . aspirin 81 MG tablet Take 81  mg by mouth daily.    . Cholecalciferol (VITAMIN D PO) Take 5,000 Units by mouth daily.    . hydrochlorothiazide (HYDRODIURIL) 25 MG tablet TAKE 1/2 TO 1 TABLET BY MOUTH EVERY DAY FOR BLOOD PRESSURE AND FLUID 30 tablet 0  . losartan (COZAAR) 50 MG tablet Take 1 tablet daily for BP 90 tablet 1  . Magnesium 400 MG TABS Take 1 tablet by mouth daily.    . pravastatin (PRAVACHOL) 40 MG tablet TAKE 1 TABLET BY MOUTH EVERY DAY (Patient taking differently: TAKE 1/2 TABLET BY MOUTH EVERY DAY) 90 tablet 3  . sertraline (ZOLOFT) 50 MG tablet TAKE 1 TABLET BY MOUTH EVERY DAY 90 tablet 0   No current facility-administered medications on file prior to visit.     Allergies  Allergen Reactions  . Prednisone Other (See Comments)    HIGH DOSE CAUSES INSOMNIA    Current Problems (verified) Patient Active Problem List   Diagnosis Date Noted  . Osteopenia 06/16/2018  . BMI 22.0-22.9, adult 06/05/2017  . CKD (chronic kidney disease) stage 3, GFR 30-59 ml/min (HCC) 06/05/2017  . Recurrent major depressive disorder, in full remission ([place]) 04/17/2016  . Encounter for Medicare annual wellness exam 12/28/2014  . Medication management 10/06/2013  . Essential hypertension 11/20/2012  . Hyperlipidemia 11/20/2012  . Other abnormal glucose (prediabetes) 11/20/2012  . Vitamin D deficiency 11/20/2012    Screening Tests Immunization History  Administered Date(s) Administered  . Influenza Nasal 11/06/2011  . Influenza, High Dose Seasonal PF 10/06/2013, 09/23/2014  . Influenza-Unspecified 10/24/2012, 10/15/2016  . Pneumococcal Conjugate-13 06/08/2014  . Pneumococcal-Unspecified 02/07/2006  . Tdap 04/04/2011  . Zoster 01/15/2009   Preventative care: Last colonoscopy: 1999 declines Cologuard June 2016 negative WILL SEND NEW ONE  Mammogram 06/2013 DUE DEXA 05/2013 osteopenia - Radius T -2.4 PAP remote  Tdap 2013 Influenza 2019 at [facility] 13 2016 Pneumonia 2008 Zoster 2011  Names of Other  Physician/Practitioners you currently use: 1. [facility] here for primary care 2. Dr. Dolores Lory, retired, eye doctor, last visit 2015, needs appointment 3. Not seeing one, dentist, last visit remote, needs to schedule   Patient Care Team: Unk Pinto, MD as PCP - General (Internal Medicine)  SURGICAL HISTORY She  has a past surgical history that includes Eye surgery (Bilateral, 2012). FAMILY HISTORY Her family history includes Cancer in her brother, brother, and father; Diabetes in her brother, brother, and mother; Heart disease in her mother; Hyperlipidemia in her mother; Hypertension in her mother. SOCIAL HISTORY She  reports that she is a non-smoker but has been exposed to tobacco smoke. She has never used smokeless tobacco. She reports that she does not drink alcohol or use drugs.   MEDICARE WELLNESS OBJECTIVES: Physical activity: Current Exercise Habits: Home exercise routine, Type of exercise: walking;Other - see comments(yardwork), Time (Minutes): 45, Frequency (Times/Week): 2, Weekly Exercise (Minutes/Week): 90, Intensity: Mild, Exercise limited by: None identified Cardiac risk factors: Cardiac Risk Factors include: advanced age (>14men, >38 women);hypertension;dyslipidemia Depression/mood screen:   Depression screen [facility] 2/9 06/18/2018  Decreased Interest 0  Down, Depressed, Hopeless 0  PHQ - 2 Score 0  Altered sleeping -  Tired, decreased energy -  Change in appetite -  Feeling bad or failure about yourself  -  Trouble concentrating -  Moving slowly or fidgety/restless -  Suicidal thoughts -  PHQ-9 Score -  Difficult doing work/chores -    ADLs:  In your present state of health, do you have any difficulty performing the following activities: 06/18/2018  Hearing? N  Vision? N  Difficulty concentrating or making decisions? N  Walking or climbing stairs? N  Dressing or bathing? N  Doing errands, shopping? N  Some recent data might be  hidden     Cognitive Testing  Alert? Yes  Normal Appearance?Yes  Oriented to person? Yes  Place? Yes   Time? Yes  Recall of three objects?  Yes  Can perform simple calculations? Yes  Displays appropriate judgment?Yes  Can read the correct time from a watch face?Yes  EOL planning: Does Patient Have a Medical Advance Directive?: No Would patient like information on creating a medical advance directive?: No - Patient declined  Review of Systems  Constitutional: Negative for malaise/fatigue and weight loss.  HENT: Negative for hearing loss and tinnitus.   Eyes: Negative for blurred vision and double vision.  Respiratory: Negative for cough, sputum production, shortness of breath and wheezing.   Cardiovascular: Negative for chest pain, palpitations, orthopnea, claudication, leg swelling and PND.  Gastrointestinal: Negative for abdominal pain, blood in stool, constipation, diarrhea, heartburn, melena, nausea and vomiting.  Genitourinary: Negative.   Musculoskeletal: Negative for falls, joint pain and myalgias.  Skin: Negative for rash.  Neurological: Negative for dizziness, tingling, sensory change, weakness and headaches.  Endo/Heme/Allergies: Negative for polydipsia.  Psychiatric/Behavioral: Negative.  Negative for depression, memory loss, substance abuse and suicidal ideas. The patient is not nervous/anxious and does not have insomnia.   All other systems reviewed and are negative.    Objective:     Today's Vitals   06/18/18 1029  BP: 130/60  Pulse: (!) 52  Temp: 97.9 F (36.6 C)  SpO2: 97%  Weight: 129 lb (58.5 kg)  Height: 5\' 3"  (1.6 m)   Body mass index is 22.85 kg/m.  General appearance: alert, no distress, WD/WN, female HEENT: normocephalic, sclerae anicteric, TMs pearly, nares patent, no discharge or erythema, pharynx normal Oral cavity: MMM, no lesions Neck: supple, no lymphadenopathy, no thyromegaly, no masses Heart: RRR, normal S1, S2, no murmurs Lungs: CTA  bilaterally, no wheezes, rhonchi, or rales Abdomen: +bs, soft, non tender, non distended, no masses, no hepatomegaly, no splenomegaly Musculoskeletal: nontender, no swelling, no obvious deformity Extremities: no edema, no cyanosis, no clubbing Pulses: 2+ symmetric, upper and lower extremities, normal cap refill Neurological: alert, oriented x 3, CN2-12 intact, strength normal upper extremities and lower extremities, sensation normal throughout, DTRs 2+ throughout, no cerebellar signs, gait normal Psychiatric: normal affect, behavior normal, pleasant   Medicare Attestation I have personally reviewed: The patient's medical and social history Their use of alcohol, tobacco or illicit drugs Their current medications and supplements The patient's functional ability including ADLs,fall risks, home safety risks, cognitive, and hearing and visual impairment Diet and physical activities Evidence for depression or mood disorders  The patient's weight, height, BMI, and visual acuity have been recorded in the chart.  I have made referrals, counseling, and provided education to the patient based on review of the above and I have provided the patient with a written personalized care plan for preventive services.     Izora Ribas, NP   06/18/2018

## 2018-06-18 ENCOUNTER — Ambulatory Visit (INDEPENDENT_AMBULATORY_CARE_PROVIDER_SITE_OTHER): Payer: Medicare HMO | Admitting: Adult Health

## 2018-06-18 ENCOUNTER — Other Ambulatory Visit: Payer: Self-pay

## 2018-06-18 ENCOUNTER — Encounter: Payer: Self-pay | Admitting: Adult Health

## 2018-06-18 VITALS — BP 130/60 | HR 52 | Temp 97.9°F | Ht 63.0 in | Wt 129.0 lb

## 2018-06-18 DIAGNOSIS — M85839 Other specified disorders of bone density and structure, unspecified forearm: Secondary | ICD-10-CM

## 2018-06-18 DIAGNOSIS — N183 Chronic kidney disease, stage 3 unspecified: Secondary | ICD-10-CM

## 2018-06-18 DIAGNOSIS — F3342 Major depressive disorder, recurrent, in full remission: Secondary | ICD-10-CM | POA: Diagnosis not present

## 2018-06-18 DIAGNOSIS — R69 Illness, unspecified: Secondary | ICD-10-CM | POA: Diagnosis not present

## 2018-06-18 DIAGNOSIS — E782 Mixed hyperlipidemia: Secondary | ICD-10-CM

## 2018-06-18 DIAGNOSIS — E559 Vitamin D deficiency, unspecified: Secondary | ICD-10-CM | POA: Diagnosis not present

## 2018-06-18 DIAGNOSIS — I10 Essential (primary) hypertension: Secondary | ICD-10-CM | POA: Diagnosis not present

## 2018-06-18 DIAGNOSIS — Z6822 Body mass index (BMI) 22.0-22.9, adult: Secondary | ICD-10-CM

## 2018-06-18 DIAGNOSIS — Z0001 Encounter for general adult medical examination with abnormal findings: Secondary | ICD-10-CM

## 2018-06-18 DIAGNOSIS — R6889 Other general symptoms and signs: Secondary | ICD-10-CM

## 2018-06-18 DIAGNOSIS — Z79899 Other long term (current) drug therapy: Secondary | ICD-10-CM

## 2018-06-18 DIAGNOSIS — Z0000 Encounter for general adult medical examination without abnormal findings: Secondary | ICD-10-CM

## 2018-06-18 DIAGNOSIS — R7309 Other abnormal glucose: Secondary | ICD-10-CM

## 2018-06-18 DIAGNOSIS — Z1211 Encounter for screening for malignant neoplasm of colon: Secondary | ICD-10-CM

## 2018-06-18 NOTE — Patient Instructions (Addendum)
   Monique Barton , Thank you for taking time to come for your Medicare Wellness Visit. I appreciate your ongoing commitment to your health goals. Please review the following plan we discussed and let me know if I can assist you in the future.   These are the goals we discussed: Goals    . DIET - INCREASE WATER INTAKE     Work up to 4+ bottles daily     . Exercise 150 min/wk Moderate Activity       This is a list of the screening recommended for you and due dates:  Health Maintenance  Topic Date Due  . Flu Shot  08/16/2018  . Tetanus Vaccine  04/03/2021  . DEXA scan (bone density measurement)  Completed  . Pneumonia vaccines  Completed   Call us if you don't receive the new cologuard box in 1-2 weeks   Please schedule a eye exam appointment this year   When you call to schedule mammogram, please schedule bone density at the same time (order has been entered)  [facility]  7 a.m.-6:30 p.m., Monday 7 a.m.-5 p.m., Tuesday-Friday Schedule an appointment by calling [phone].  [facility] Schedule an appointment by calling [phone].    Know what a healthy weight is for you (roughly BMI <25) and aim to maintain this  Aim for 7+ servings of fruits and vegetables daily  65-80+ fluid ounces of water or unsweet tea for healthy kidneys  Limit to max 1 drink of alcohol per day; avoid smoking/tobacco  Limit animal fats in diet for cholesterol and heart health - choose grass fed whenever available  Avoid highly processed foods, and foods high in saturated/trans fats  Aim for low stress - take time to unwind and care for your mental health  Aim for 150 min of moderate intensity exercise weekly for heart health, and weights twice weekly for bone health  Aim for 7-9 hours of sleep daily

## 2018-06-19 ENCOUNTER — Other Ambulatory Visit: Payer: Self-pay | Admitting: Adult Health

## 2018-06-19 DIAGNOSIS — N184 Chronic kidney disease, stage 4 (severe): Secondary | ICD-10-CM

## 2018-06-19 LAB — CBC WITH DIFFERENTIAL/PLATELET
Absolute Monocytes: 437 cells/uL (ref 200–950)
Basophils Absolute: 39 cells/uL (ref 0–200)
Basophils Relative: 0.7 %
Eosinophils Absolute: 179 cells/uL (ref 15–500)
Eosinophils Relative: 3.2 %
HCT: 36.1 % (ref 35.0–45.0)
Hemoglobin: 11.8 g/dL (ref 11.7–15.5)
Lymphs Abs: 1792 cells/uL (ref 850–3900)
MCH: 29.4 pg (ref 27.0–33.0)
MCHC: 32.7 g/dL (ref 32.0–36.0)
MCV: 89.8 fL (ref 80.0–100.0)
MPV: 11.5 fL (ref 7.5–12.5)
Monocytes Relative: 7.8 %
Neutro Abs: 3153 cells/uL (ref 1500–7800)
Neutrophils Relative %: 56.3 %
Platelets: 214 10*3/uL (ref 140–400)
RBC: 4.02 10*6/uL (ref 3.80–5.10)
RDW: 12.9 % (ref 11.0–15.0)
Total Lymphocyte: 32 %
WBC: 5.6 10*3/uL (ref 3.8–10.8)

## 2018-06-19 LAB — COMPLETE METABOLIC PANEL WITH GFR
AG Ratio: 1.8 (calc) (ref 1.0–2.5)
ALT: 11 U/L (ref 6–29)
AST: 19 U/L (ref 10–35)
Albumin: 4.3 g/dL (ref 3.6–5.1)
Alkaline phosphatase (APISO): 73 U/L (ref 37–153)
BUN/Creatinine Ratio: 18 (calc) (ref 6–22)
BUN: 31 mg/dL — ABNORMAL HIGH (ref 7–25)
CO2: 30 mmol/L (ref 20–32)
Calcium: 9.4 mg/dL (ref 8.6–10.4)
Chloride: 101 mmol/L (ref 98–110)
Creat: 1.68 mg/dL — ABNORMAL HIGH (ref 0.60–0.93)
GFR, Est African American: 33 mL/min/{1.73_m2} — ABNORMAL LOW (ref >=60)
GFR, Est Non African American: 29 mL/min/{1.73_m2} — ABNORMAL LOW (ref >=60)
Globulin: 2.4 g/dL (calc) (ref 1.9–3.7)
Glucose, Bld: 88 mg/dL (ref 65–99)
Potassium: 4.1 mmol/L (ref 3.5–5.3)
Sodium: 141 mmol/L (ref 135–146)
Total Bilirubin: 0.5 mg/dL (ref 0.2–1.2)
Total Protein: 6.7 g/dL (ref 6.1–8.1)

## 2018-06-19 LAB — LIPID PANEL
Cholesterol: 177 mg/dL (ref <200)
HDL: 63 mg/dL (ref >=50)
LDL Cholesterol (Calc): 98 mg/dL (calc)
Non-HDL Cholesterol (Calc): 114 mg/dL (calc) (ref <130)
Total CHOL/HDL Ratio: 2.8 (calc) (ref <5.0)
Triglycerides: 70 mg/dL (ref <150)

## 2018-06-19 LAB — HEMOGLOBIN A1C
Hgb A1c MFr Bld: 5.6 % of total Hgb (ref <5.7)
Mean Plasma Glucose: 114 (calc)
eAG (mmol/L): 6.3 (calc)

## 2018-06-19 LAB — TSH: TSH: 0.93 mIU/L (ref 0.40–4.50)

## 2018-06-19 LAB — MAGNESIUM: Magnesium: 2.4 mg/dL (ref 1.5–2.5)

## 2018-06-19 LAB — VITAMIN D 25 HYDROXY (VIT D DEFICIENCY, FRACTURES): Vit D, 25-Hydroxy: 41 ng/mL (ref 30–100)

## 2018-06-25 ENCOUNTER — Other Ambulatory Visit: Payer: Self-pay | Admitting: Adult Health

## 2018-06-25 DIAGNOSIS — I10 Essential (primary) hypertension: Secondary | ICD-10-CM

## 2018-07-02 ENCOUNTER — Other Ambulatory Visit: Payer: Self-pay | Admitting: Adult Health

## 2018-07-03 ENCOUNTER — Ambulatory Visit: Payer: Medicare HMO

## 2018-07-08 ENCOUNTER — Other Ambulatory Visit: Payer: Self-pay

## 2018-07-08 ENCOUNTER — Ambulatory Visit (INDEPENDENT_AMBULATORY_CARE_PROVIDER_SITE_OTHER): Payer: Medicare HMO | Admitting: *Deleted

## 2018-07-08 DIAGNOSIS — N184 Chronic kidney disease, stage 4 (severe): Secondary | ICD-10-CM

## 2018-07-08 NOTE — Progress Notes (Signed)
Patient here for a NV to recheck a Bmet. She states she has increased her fluid intake daily.

## 2018-07-09 LAB — BASIC METABOLIC PANEL WITH GFR
BUN/Creatinine Ratio: 19 (calc) (ref 6–22)
BUN: 27 mg/dL — ABNORMAL HIGH (ref 7–25)
CO2: 32 mmol/L (ref 20–32)
Calcium: 9.6 mg/dL (ref 8.6–10.4)
Chloride: 103 mmol/L (ref 98–110)
Creat: 1.44 mg/dL — ABNORMAL HIGH (ref 0.60–0.93)
GFR, Est African American: 40 mL/min/{1.73_m2} — ABNORMAL LOW (ref >=60)
GFR, Est Non African American: 35 mL/min/{1.73_m2} — ABNORMAL LOW (ref >=60)
Glucose, Bld: 92 mg/dL (ref 65–99)
Potassium: 4.5 mmol/L (ref 3.5–5.3)
Sodium: 140 mmol/L (ref 135–146)

## 2018-07-10 ENCOUNTER — Other Ambulatory Visit: Payer: Self-pay | Admitting: Internal Medicine

## 2018-07-16 ENCOUNTER — Other Ambulatory Visit: Payer: Self-pay | Admitting: Adult Health

## 2018-07-16 DIAGNOSIS — Z1231 Encounter for screening mammogram for malignant neoplasm of breast: Secondary | ICD-10-CM

## 2018-07-18 ENCOUNTER — Other Ambulatory Visit: Payer: Self-pay | Admitting: Adult Health

## 2018-07-18 DIAGNOSIS — I10 Essential (primary) hypertension: Secondary | ICD-10-CM

## 2018-07-22 DIAGNOSIS — Z1211 Encounter for screening for malignant neoplasm of colon: Secondary | ICD-10-CM | POA: Diagnosis not present

## 2018-07-29 LAB — COLOGUARD: Cologuard: NEGATIVE

## 2018-07-30 ENCOUNTER — Encounter: Payer: Self-pay | Admitting: Internal Medicine

## 2018-09-24 ENCOUNTER — Other Ambulatory Visit: Payer: Self-pay | Admitting: Adult Health

## 2018-09-25 ENCOUNTER — Ambulatory Visit
Admission: RE | Admit: 2018-09-25 | Discharge: 2018-09-25 | Disposition: A | Discharge status: Home / Self Care | Payer: Medicare HMO | Source: Ambulatory Visit | Attending: Adult Health | Admitting: Adult Health

## 2018-09-25 ENCOUNTER — Other Ambulatory Visit: Payer: Self-pay

## 2018-09-25 DIAGNOSIS — Z1231 Encounter for screening mammogram for malignant neoplasm of breast: Secondary | ICD-10-CM

## 2018-09-25 DIAGNOSIS — M85839 Other specified disorders of bone density and structure, unspecified forearm: Secondary | ICD-10-CM

## 2018-09-25 DIAGNOSIS — Z780 Asymptomatic menopausal state: Secondary | ICD-10-CM | POA: Diagnosis not present

## 2018-09-25 DIAGNOSIS — M8589 Other specified disorders of bone density and structure, multiple sites: Secondary | ICD-10-CM | POA: Diagnosis not present

## 2018-10-02 DIAGNOSIS — R69 Illness, unspecified: Secondary | ICD-10-CM | POA: Diagnosis not present

## 2018-10-09 ENCOUNTER — Encounter: Payer: Self-pay | Admitting: Physician Assistant

## 2018-11-26 ENCOUNTER — Encounter: Payer: Self-pay | Admitting: Internal Medicine

## 2018-11-26 NOTE — Patient Instructions (Signed)

## 2018-11-26 NOTE — Progress Notes (Signed)
Annual Screening/Preventative Visit & Comprehensive Evaluation &  Examination     This very nice 78 y.o. WWF presents for a Screening /Preventative Visit & comprehensive evaluation and management of multiple medical co-morbidities.  Patient has been followed for HTN, HLD, Prediabetes  and Vitamin D Deficiency.      HTN predates since     . Patient's BP has been controlled at home and patient denies any cardiac symptoms as chest pain, palpitations, shortness of breath, dizziness or ankle swelling. Patient has CKD 3 consequent of her HTVD. Today's BP was initially elevated & rechecked at goal - 138/82.      Patient's hyperlipidemia is controlled with diet and medications. Patient denies myalgias or other medication SE's. Last lipids were at goal:  Lab Results  Component Value Date   CHOL 178 11/27/2018   HDL 65 11/27/2018   LDLCALC 96 11/27/2018   TRIG 76 11/27/2018   CHOLHDL 2.7 11/27/2018      Patient has hx/o prediabetes (A1c 5.8% / 2013 &  6.0% / 2016)  and patient denies reactive hypoglycemic symptoms, visual blurring, diabetic polys or paresthesias. Last A1c was Normal & at goal:  Lab Results  Component Value Date   HGBA1C 5.6 11/27/2018      Finally, patient has history of Vitamin D Deficiency and last Vitamin D was low:  Lab Results  Component Value Date   VD25OH 56 11/27/2018   Current Outpatient Medications on File Prior to Visit  Medication Sig  . aspirin 81 MG tablet Take 81 mg by mouth daily.  . Cholecalciferol (VITAMIN D PO) Take 5,000 Units by mouth daily.  . hydrochlorothiazide (HYDRODIURIL) 25 MG tablet Take 1/2 to 1 tablet Daily for BP & Fluid / Swelling  . losartan (COZAAR) 25 MG tablet TAKE 2 TABLETS BY MOUTH DAILY FOR BLOOD PRESSURE (LOSARTAN POTSSIUM 50MG  NOT AVAILABLE)  . Magnesium 400 MG TABS Take 1 tablet by mouth daily.  . pravastatin (PRAVACHOL) 40 MG tablet TAKE 1 TABLET BY MOUTH EVERY DAY (Patient taking differently: TAKE 1/2 TABLET BY MOUTH EVERY DAY)   . sertraline (ZOLOFT) 50 MG tablet Take 1 tablet Daily for Mood   No current facility-administered medications on file prior to visit.    Allergies  Allergen Reactions  . Prednisone Other (See Comments)    HIGH DOSE CAUSES INSOMNIA   Past Medical History:  Diagnosis Date  . Hyperlipidemia   . Hypertension   . Other abnormal glucose   . Unspecified vitamin D deficiency   . Wrist fracture, right    Health Maintenance  Topic Date Due  . TETANUS/TDAP  04/03/2021  . INFLUENZA VACCINE  Completed  . DEXA SCAN  Completed  . PNA vac Low Risk Adult  Completed   Immunization History  Administered Date(s) Administered  . Fluad Quad(high Dose 65+) 10/02/2018  . Influenza Nasal 11/06/2011  . Influenza, High Dose Seasonal PF 10/06/2013, 09/23/2014, 10/30/2017  . Influenza-Unspecified 10/24/2012, 10/15/2016  . Pneumococcal Conjugate-13 06/08/2014  . Pneumococcal-Unspecified 02/07/2006  . Tdap 04/04/2011  . Zoster 01/15/2009   Cologard - 07/29/2018 - Negative - Recc 3 yr f/u due July 2023  Last MGM & dexaBMD - 09/25/2018  Past Surgical History:  Procedure Laterality Date  . EYE SURGERY Bilateral 2012   Family History  Problem Relation Age of Onset  . Diabetes Mother   . Heart disease Mother   . Hyperlipidemia Mother   . Hypertension Mother   . Cancer Father        PROSTATE  .  Diabetes Brother   . Diabetes Brother   . Cancer Brother        LUNG  . Cancer Brother        BONE   Social History   Tobacco Use  . Smoking status: Passive Smoke Exposure - Never Smoker  . Smokeless tobacco: Never Used  . Tobacco comment: Second hand smoke exposure  Substance Use Topics  . Alcohol use: No  . Drug use: No    ROS Constitutional: Denies fever, chills, weight loss/gain, headaches, insomnia,  night sweats, and change in appetite. Does c/o fatigue. Eyes: Denies redness, blurred vision, diplopia, discharge, itchy, watery eyes.  ENT: Denies discharge, congestion, post nasal drip,  epistaxis, sore throat, earache, hearing loss, dental pain, Tinnitus, Vertigo, Sinus pain, snoring.  Cardio: Denies chest pain, palpitations, irregular heartbeat, syncope, dyspnea, diaphoresis, orthopnea, PND, claudication, edema Respiratory: denies cough, dyspnea, DOE, pleurisy, hoarseness, laryngitis, wheezing.  Gastrointestinal: Denies dysphagia, heartburn, reflux, water brash, pain, cramps, nausea, vomiting, bloating, diarrhea, constipation, hematemesis, melena, hematochezia, jaundice, hemorrhoids Genitourinary: Denies dysuria, frequency, urgency, nocturia, hesitancy, discharge, hematuria, flank pain Breast: Breast lumps, nipple discharge, bleeding.  Musculoskeletal: Denies arthralgia, myalgia, stiffness, Jt. Swelling, pain, limp, and strain/sprain. Denies falls. Skin: Denies puritis, rash, hives, warts, acne, eczema, changing in skin lesion Neuro: No weakness, tremor, incoordination, spasms, paresthesia, pain Psychiatric: Denies confusion, memory loss, sensory loss. Denies Depression. Endocrine: Denies change in weight, skin, hair change, nocturia, and paresthesia, diabetic polys, visual blurring, hyper / hypo glycemic episodes.  Heme/Lymph: No excessive bleeding, bruising, enlarged lymph nodes.  Physical Exam  BP 138/82   Pulse 64   Temp (!) 97.2 F (36.2 C)   Resp 16   Ht 5' 3.5" (1.613 m)   Wt 129 lb 6.4 oz (58.7 kg)   BMI 22.56 kg/m   General Appearance: Well nourished, well groomed and in no apparent distress.  Eyes: PERRLA, EOMs, conjunctiva no swelling or erythema, normal fundi and vessels. Sinuses: No frontal/maxillary tenderness ENT/Mouth: EACs patent / TMs  nl. Nares clear without erythema, swelling, mucoid exudates. Oral hygiene is good. No erythema, swelling, or exudate. Tongue normal, non-obstructing. Tonsils not swollen or erythematous. Hearing normal.  Neck: Supple, thyroid not palpable. No bruits, nodes or JVD. Respiratory: Respiratory effort normal.  BS equal and  clear bilateral without rales, rhonci, wheezing or stridor. Cardio: Heart sounds are normal with regular rate and rhythm and no murmurs, rubs or gallops. Peripheral pulses are normal and equal bilaterally without edema. No aortic or femoral bruits. Chest: symmetric with normal excursions and percussion. Breasts: Symmetric, without lumps, nipple discharge, retractions, or fibrocystic changes.  Abdomen: Flat, soft with bowel sounds active. Nontender, no guarding, rebound, hernias, masses, or organomegaly.  Lymphatics: Non tender without lymphadenopathy.  Musculoskeletal: Full ROM all peripheral extremities, joint stability, 5/5 strength, and normal gait. Skin: Warm and dry without rashes, lesions, cyanosis, clubbing or  ecchymosis.  Neuro: Cranial nerves intact, reflexes equal bilaterally. Normal muscle tone, no cerebellar symptoms. Sensation intact.  Pysch: Alert and oriented X 3, normal affect, Insight and Judgment appropriate.   Assessment and Plan  1. Annual Preventative Screening Examination  2. Essential hypertension  - EKG 12-Lead - Urinalysis, Routine w reflex microscopic - Microalbumin / Creatinine Urine Ratio - CBC with Diff - COMPLETE METABOLIC PANEL WITH GFR - Magnesium - TSH  3. Hyperlipidemia, mixed  - EKG 12-Lead - Lipid Profile  4. Abnormal glucose  - EKG 12-Lead - Hemoglobin A1c ([facility]) - Insulin, random  5. Vitamin D deficiency  -  Vitamin D (25 hydroxy)  6. Prediabetes  - EKG 12-Lead - Hemoglobin A1c ([facility]) - Insulin, random  7. Hypertensive kidney disease with stage 3 chronic kidney disease, unspecified whether stage 3a or 3b CKD  - EKG 12-Lead - Urinalysis, Routine w reflex microscopic - Microalbumin / Creatinine Urine Ratio - COMPLETE METABOLIC PANEL WITH GFR  8. Hypertensive heart disease without heart failure  - EKG 12-Lead  9. Screening for colorectal cancer  - POC Hemoccult Bld/Stl  10. Screening for ischemic heart disease  -  EKG 12-Lead  11. FHx: heart disease  - EKG 12-Lead  12. Exposure to second hand smoke  - EKG 12-Lead  13. Medication management  - Urinalysis, Routine w reflex microscopic - Microalbumin / Creatinine Urine Ratio - CBC with Diff - COMPLETE METABOLIC PANEL WITH GFR - Magnesium - Lipid Profile - TSH - Hemoglobin A1c ([facility]) - Insulin, random - Vitamin D (25 hydroxy)        Patient was counseled in prudent diet to achieve/maintain BMI less than 25 for weight control, BP monitoring, regular exercise and medications. Discussed med's effects and SE's. Screening labs and tests as requested with regular follow-up as recommended. Over 40 minutes of exam, counseling, chart review and high complex critical decision making was performed.   Kirtland Bouchard, MD

## 2018-11-27 ENCOUNTER — Ambulatory Visit (INDEPENDENT_AMBULATORY_CARE_PROVIDER_SITE_OTHER): Payer: Medicare HMO | Admitting: Internal Medicine

## 2018-11-27 ENCOUNTER — Other Ambulatory Visit: Payer: Self-pay

## 2018-11-27 VITALS — BP 138/82 | HR 64 | Temp 97.2°F | Resp 16 | Ht 63.5 in | Wt 129.4 lb

## 2018-11-27 DIAGNOSIS — Z0000 Encounter for general adult medical examination without abnormal findings: Secondary | ICD-10-CM | POA: Diagnosis not present

## 2018-11-27 DIAGNOSIS — E782 Mixed hyperlipidemia: Secondary | ICD-10-CM

## 2018-11-27 DIAGNOSIS — I10 Essential (primary) hypertension: Secondary | ICD-10-CM | POA: Diagnosis not present

## 2018-11-27 DIAGNOSIS — Z8249 Family history of ischemic heart disease and other diseases of the circulatory system: Secondary | ICD-10-CM | POA: Diagnosis not present

## 2018-11-27 DIAGNOSIS — R7303 Prediabetes: Secondary | ICD-10-CM | POA: Diagnosis not present

## 2018-11-27 DIAGNOSIS — Z7722 Contact with and (suspected) exposure to environmental tobacco smoke (acute) (chronic): Secondary | ICD-10-CM

## 2018-11-27 DIAGNOSIS — I119 Hypertensive heart disease without heart failure: Secondary | ICD-10-CM

## 2018-11-27 DIAGNOSIS — Z79899 Other long term (current) drug therapy: Secondary | ICD-10-CM

## 2018-11-27 DIAGNOSIS — I129 Hypertensive chronic kidney disease with stage 1 through stage 4 chronic kidney disease, or unspecified chronic kidney disease: Secondary | ICD-10-CM

## 2018-11-27 DIAGNOSIS — Z1211 Encounter for screening for malignant neoplasm of colon: Secondary | ICD-10-CM

## 2018-11-27 DIAGNOSIS — Z136 Encounter for screening for cardiovascular disorders: Secondary | ICD-10-CM | POA: Diagnosis not present

## 2018-11-27 DIAGNOSIS — Z1212 Encounter for screening for malignant neoplasm of rectum: Secondary | ICD-10-CM

## 2018-11-27 DIAGNOSIS — E559 Vitamin D deficiency, unspecified: Secondary | ICD-10-CM | POA: Diagnosis not present

## 2018-11-27 DIAGNOSIS — R7309 Other abnormal glucose: Secondary | ICD-10-CM

## 2018-11-27 DIAGNOSIS — Z0001 Encounter for general adult medical examination with abnormal findings: Secondary | ICD-10-CM

## 2018-11-27 DIAGNOSIS — N183 Chronic kidney disease, stage 3 unspecified: Secondary | ICD-10-CM

## 2018-11-28 LAB — URINALYSIS, ROUTINE W REFLEX MICROSCOPIC
Bilirubin Urine: NEGATIVE
Glucose, UA: NEGATIVE
Hgb urine dipstick: NEGATIVE
Ketones, ur: NEGATIVE
Leukocytes,Ua: NEGATIVE
Nitrite: NEGATIVE
Protein, ur: NEGATIVE
Specific Gravity, Urine: 1.007 (ref 1.001–1.03)
pH: 7.5 (ref 5.0–8.0)

## 2018-11-28 LAB — CBC WITH DIFFERENTIAL/PLATELET
Absolute Monocytes: 384 cells/uL (ref 200–950)
Basophils Absolute: 29 cells/uL (ref 0–200)
Basophils Relative: 0.6 %
Eosinophils Absolute: 173 cells/uL (ref 15–500)
Eosinophils Relative: 3.6 %
HCT: 34.8 % — ABNORMAL LOW (ref 35.0–45.0)
Hemoglobin: 11.1 g/dL — ABNORMAL LOW (ref 11.7–15.5)
Lymphs Abs: 1262 cells/uL (ref 850–3900)
MCH: 28.8 pg (ref 27.0–33.0)
MCHC: 31.9 g/dL — ABNORMAL LOW (ref 32.0–36.0)
MCV: 90.4 fL (ref 80.0–100.0)
MPV: 11.4 fL (ref 7.5–12.5)
Monocytes Relative: 8 %
Neutro Abs: 2952 cells/uL (ref 1500–7800)
Neutrophils Relative %: 61.5 %
Platelets: 215 10*3/uL (ref 140–400)
RBC: 3.85 10*6/uL (ref 3.80–5.10)
RDW: 13 % (ref 11.0–15.0)
Total Lymphocyte: 26.3 %
WBC: 4.8 10*3/uL (ref 3.8–10.8)

## 2018-11-28 LAB — COMPLETE METABOLIC PANEL WITH GFR
AG Ratio: 1.6 (calc) (ref 1.0–2.5)
ALT: 11 U/L (ref 6–29)
AST: 17 U/L (ref 10–35)
Albumin: 4.1 g/dL (ref 3.6–5.1)
Alkaline phosphatase (APISO): 71 U/L (ref 37–153)
BUN/Creatinine Ratio: 21 (calc) (ref 6–22)
BUN: 35 mg/dL — ABNORMAL HIGH (ref 7–25)
CO2: 32 mmol/L (ref 20–32)
Calcium: 9.5 mg/dL (ref 8.6–10.4)
Chloride: 102 mmol/L (ref 98–110)
Creat: 1.7 mg/dL — ABNORMAL HIGH (ref 0.60–0.93)
GFR, Est African American: 33 mL/min/{1.73_m2} — ABNORMAL LOW (ref >=60)
GFR, Est Non African American: 28 mL/min/{1.73_m2} — ABNORMAL LOW (ref >=60)
Globulin: 2.5 g/dL (calc) (ref 1.9–3.7)
Glucose, Bld: 91 mg/dL (ref 65–99)
Potassium: 4.8 mmol/L (ref 3.5–5.3)
Sodium: 141 mmol/L (ref 135–146)
Total Bilirubin: 0.4 mg/dL (ref 0.2–1.2)
Total Protein: 6.6 g/dL (ref 6.1–8.1)

## 2018-11-28 LAB — MICROALBUMIN / CREATININE URINE RATIO
Creatinine, Urine: 28 mg/dL (ref 20–275)
Microalb, Ur: <0.2 mg/dL

## 2018-11-28 LAB — INSULIN, RANDOM: Insulin: 3.6 u[IU]/mL

## 2018-11-28 LAB — LIPID PANEL
Cholesterol: 178 mg/dL (ref <200)
HDL: 65 mg/dL (ref >=50)
LDL Cholesterol (Calc): 96 mg/dL (calc)
Non-HDL Cholesterol (Calc): 113 mg/dL (calc) (ref <130)
Total CHOL/HDL Ratio: 2.7 (calc) (ref <5.0)
Triglycerides: 76 mg/dL (ref <150)

## 2018-11-28 LAB — HEMOGLOBIN A1C
Hgb A1c MFr Bld: 5.6 % of total Hgb (ref <5.7)
Mean Plasma Glucose: 114 (calc)
eAG (mmol/L): 6.3 (calc)

## 2018-11-28 LAB — VITAMIN D 25 HYDROXY (VIT D DEFICIENCY, FRACTURES): Vit D, 25-Hydroxy: 52 ng/mL (ref 30–100)

## 2018-11-28 LAB — TSH: TSH: 1.09 mIU/L (ref 0.40–4.50)

## 2018-11-28 LAB — MAGNESIUM: Magnesium: 2.2 mg/dL (ref 1.5–2.5)

## 2018-11-30 ENCOUNTER — Encounter: Payer: Self-pay | Admitting: Internal Medicine

## 2018-12-25 DIAGNOSIS — L821 Other seborrheic keratosis: Secondary | ICD-10-CM | POA: Diagnosis not present

## 2018-12-25 DIAGNOSIS — L814 Other melanin hyperpigmentation: Secondary | ICD-10-CM | POA: Diagnosis not present

## 2018-12-25 DIAGNOSIS — L570 Actinic keratosis: Secondary | ICD-10-CM | POA: Diagnosis not present

## 2018-12-25 DIAGNOSIS — D0439 Carcinoma in situ of skin of other parts of face: Secondary | ICD-10-CM | POA: Diagnosis not present

## 2018-12-30 ENCOUNTER — Other Ambulatory Visit: Payer: Self-pay | Admitting: Internal Medicine

## 2019-01-03 ENCOUNTER — Other Ambulatory Visit: Payer: Self-pay | Admitting: Internal Medicine

## 2019-01-06 DIAGNOSIS — D0439 Carcinoma in situ of skin of other parts of face: Secondary | ICD-10-CM | POA: Diagnosis not present

## 2019-03-03 ENCOUNTER — Other Ambulatory Visit: Payer: Self-pay | Admitting: Internal Medicine

## 2019-03-11 NOTE — Progress Notes (Signed)
MEDICARE ANNUAL WELLNESS VISIT AND FOLLOW UP  Assessment:   Diagnoses and all orders for this visit:  Encounter for Medicare annual wellness exam ophth follow up, dental follow up encouraged  Essential hypertension Continue medication Monitor blood pressure at home; call if consistently over 130/80 Continue DASH diet.   Reminder to go to the ER if any CP, SOB, nausea, dizziness, severe HA, changes vision/speech, left arm numbness and tingling and jaw pain.  Vitamin D deficiency Continue supplementation Check vitamin D level as needed  Recurrent major depressive disorder, in full remission ([place]) Continue medications  Lifestyle discussed: diet/exerise, sleep hygiene, stress management, hydration  Other abnormal glucose/Prediabetes Recent A1Cs at goal Discussed diet/exercise, weight management  Defer A1C; check CMP  Medication management CBC, CMP/GFR, magnesium   Hyperlipidemia Continue medication Continue low cholesterol diet and exercise.  Check lipid panel.   BMI 22.0-22.9, adult Continue to recommend diet heavy in fruits and veggies and low in animal meats, cheeses, and dairy products, appropriate calorie intake Discuss exercise recommendations routinely Continue to monitor weight at each visit  CKD (chronic kidney disease) stage 3, GFR 30-59 ml/min (HCC) Increase fluids, avoid NSAIDS, monitor sugars, will monitor Refer to nephrology if GFR persists <30  Osteopenia -DEXA UTD, get q2y, continue Vit D and Ca, weight bearing exercises   Over 40 minutes of exam, counseling, chart review and critical decision making was performed Future Appointments  Date Time Provider [place]  06/18/2019  9:30 AM Unk Pinto, MD [facility] None  12/21/2019 10:00 AM Unk Pinto, MD [facility] None     Plan:   During the course of the visit the patient was educated and counseled about appropriate screening and preventive services including:    Pneumococcal  vaccine   Prevnar 13  Influenza vaccine  Td vaccine  Screening electrocardiogram  Bone densitometry screening  Colorectal cancer screening  Diabetes screening  Glaucoma screening  Nutrition counseling   Advanced directives: requested   Subjective:  Monique Barton is a 79 y.o. female who presents for Medicare Annual Wellness Visit and 3 month follow up.   She has hx of depression and remains in remission on zoloft 50 mg daily.   BMI is Body mass index is 22.85 kg/m., she has been working on diet and exercise. She is doing line dancing and sometimes gardens.  Wt Readings from Last 3 Encounters:  03/12/19 129 lb (58.5 kg)  11/27/18 129 lb 6.4 oz (58.7 kg)  07/08/18 128 lb 9.6 oz (58.3 kg)    Her blood pressure has been controlled at home (130s/70s), today their BP is BP: (!) 148/72 She does workout. She denies chest pain, shortness of breath, dizziness.   She is on cholesterol medication and denies myalgias. Her cholesterol is at goal. The cholesterol last visit was:   Lab Results  Component Value Date   CHOL 178 11/27/2018   HDL 65 11/27/2018   LDLCALC 96 11/27/2018   TRIG 76 11/27/2018   CHOLHDL 2.7 11/27/2018    She has been working on diet and exercise for glucose management (recent hx of prediabetes), and denies foot ulcerations, increased appetite, nausea, paresthesia of the feet, polydipsia, polyuria, visual disturbances, vomiting and weight loss. Last A1C in the office was:  Lab Results  Component Value Date   HGBA1C 5.6 11/27/2018   Last GFR:  Lab Results  Component Value Date   GFRNONAA 28 (L) 11/27/2018  She drinks ~2 bottles daily.   Patient is on Vitamin D supplement and near goal  of 60-100:    Lab Results  Component Value Date   VD25OH 52 11/27/2018      Medication Review: Current Outpatient Medications on File Prior to Visit  Medication Sig Dispense Refill  . aspirin 81 MG tablet Take 81 mg by mouth daily.    . Cholecalciferol (VITAMIN D  PO) Take 5,000 Units by mouth daily.    Marland Kitchen losartan (COZAAR) 50 MG tablet Take 1 tablet Daily for BP 90 tablet 3  . Magnesium 400 MG TABS Take 1 tablet by mouth daily.    . pravastatin (PRAVACHOL) 40 MG tablet Take 1 tablet at Bedtime for Cholesterol 90 tablet 3  . sertraline (ZOLOFT) 50 MG tablet TAKE 1 TABLET BY MOUTH DAILY FOR MOOD 90 tablet 3  . hydrochlorothiazide (HYDRODIURIL) 25 MG tablet Take 1/2 to 1 tablet Daily for BP & Fluid / Swelling 60 tablet 1   No current facility-administered medications on file prior to visit.    Allergies  Allergen Reactions  . Prednisone Other (See Comments)    HIGH DOSE CAUSES INSOMNIA    Current Problems (verified) Patient Active Problem List   Diagnosis Date Noted  . Osteopenia 06/16/2018  . BMI 22.0-22.9, adult 06/05/2017  . CKD (chronic kidney disease) stage 3, GFR 30-59 ml/min 06/05/2017  . Recurrent major depressive disorder, in full remission ([place]) 04/17/2016  . Encounter for Medicare annual wellness exam 12/28/2014  . Medication management 10/06/2013  . Essential hypertension 11/20/2012  . Hyperlipidemia 11/20/2012  . Other abnormal glucose (prediabetes) 11/20/2012  . Vitamin D deficiency 11/20/2012    Screening Tests Immunization History  Administered Date(s) Administered  . Fluad Quad(high Dose 65+) 10/02/2018  . Influenza Nasal 11/06/2011  . Influenza, High Dose Seasonal PF 10/06/2013, 09/23/2014, 10/30/2017  . Influenza-Unspecified 10/24/2012, 10/15/2016  . PFIZER SARS-COV-2 Vaccination 02/28/2019  . Pneumococcal Conjugate-13 06/08/2014  . Pneumococcal-Unspecified 02/07/2006  . Tdap 04/04/2011  . Zoster 01/15/2009   Preventative care: Last colonoscopy: 1999 declines Cologuard 07/2018 Mammogram 09/2018 DEXA 09/2018 osteopenia - T -2.0 PAP remote  Tdap 2013 Influenza 09/2018 Prenvar 13 2016 Pneumonia 2008 Zoster 2011 Covid 19 1/2  Names of Other Physician/Practitioners you currently use: 1. [facility] here for primary care 2. Dr. Dolores Lory, retired, eye doctor, last visit 2015, needs appointment, patient states is working on this  3. Not seeing one, dentist, last visit remote, needs to schedule   Patient Care Team: Unk Pinto, MD as PCP - General (Internal Medicine)  SURGICAL HISTORY She  has a past surgical history that includes Eye surgery (Bilateral, 2012). FAMILY HISTORY Her family history includes Cancer in her brother, brother, and father; Diabetes in her brother, brother, and mother; Heart disease in her mother; Hyperlipidemia in her mother; Hypertension in her mother. SOCIAL HISTORY She  reports that she is a non-smoker but has been exposed to tobacco smoke. She has never used smokeless tobacco. She reports that she does not drink alcohol or use drugs.   MEDICARE WELLNESS OBJECTIVES: Physical activity:   Cardiac risk factors:   Depression/mood screen:   Depression screen [facility] 2/9 11/26/2018  Decreased Interest 0  Down, Depressed, Hopeless 0  PHQ - 2 Score 0  Altered sleeping -  Tired, decreased energy -  Change in appetite -  Feeling bad or failure about yourself  -  Trouble concentrating -  Moving slowly or fidgety/restless -  Suicidal thoughts -  PHQ-9 Score -  Difficult doing work/chores -    ADLs:  In your  present state of health, do you have any difficulty performing the following activities: 11/26/2018 06/18/2018  Hearing? N N  Vision? N N  Difficulty concentrating or making decisions? N N  Walking or climbing stairs? N N  Dressing or bathing? N N  Doing errands, shopping? N N  Some recent data might be hidden     Cognitive Testing  Alert? Yes  Normal Appearance?Yes  Oriented to person? Yes  Place? Yes   Time? Yes  Recall of three objects?  Yes  Can perform simple calculations? Yes  Displays appropriate judgment?Yes  Can read the correct time from a watch face?Yes  EOL planning: Does Patient Have a Medical Advance  Directive?: No Would patient like information on creating a medical advance directive?: No - Patient declined  Review of Systems  Constitutional: Negative for malaise/fatigue and weight loss.  HENT: Negative for hearing loss and tinnitus.   Eyes: Negative for blurred vision and double vision.  Respiratory: Negative for cough, sputum production, shortness of breath and wheezing.   Cardiovascular: Negative for chest pain, palpitations, orthopnea, claudication, leg swelling and PND.  Gastrointestinal: Negative for abdominal pain, blood in stool, constipation, diarrhea, heartburn, melena, nausea and vomiting.  Genitourinary: Negative.   Musculoskeletal: Negative for falls, joint pain and myalgias.  Skin: Negative for rash.  Neurological: Negative for dizziness, tingling, sensory change, weakness and headaches.  Endo/Heme/Allergies: Negative for polydipsia.  Psychiatric/Behavioral: Negative.  Negative for depression, memory loss, substance abuse and suicidal ideas. The patient is not nervous/anxious and does not have insomnia.   All other systems reviewed and are negative.    Objective:     Today's Vitals   03/12/19 0847  BP: (!) 148/72  Pulse: 80  Temp: (!) 97.5 F (36.4 C)  SpO2: 97%  Weight: 129 lb (58.5 kg)  Height: 5\' 3"  (1.6 m)   Body mass index is 22.85 kg/m.  General appearance: alert, no distress, WD/WN, female HEENT: normocephalic, sclerae anicteric, TMs pearly, nares patent, no discharge or erythema, mask in place; oral exam deferred. Hearing normal.  Neck: supple, no lymphadenopathy, no thyromegaly, no masses Heart: RRR, normal S1, S2, no murmurs Lungs: CTA bilaterally, no wheezes, rhonchi, or rales Abdomen: +bs, soft, non tender, non distended, no masses, no hepatomegaly, no splenomegaly Musculoskeletal: nontender, no swelling, no obvious deformity Extremities: no edema, no cyanosis, no clubbing Pulses: 2+ symmetric, upper and lower extremities, normal cap  refill Neurological: alert, oriented x 3, CN2-12 intact, strength normal upper extremities and lower extremities, sensation normal throughout, DTRs 2+ throughout, no cerebellar signs, gait normal Psychiatric: normal affect, behavior normal, pleasant   Medicare Attestation I have personally reviewed: The patient's medical and social history Their use of alcohol, tobacco or illicit drugs Their current medications and supplements The patient's functional ability including ADLs,fall risks, home safety risks, cognitive, and hearing and visual impairment Diet and physical activities Evidence for depression or mood disorders  The patient's weight, height, BMI, and visual acuity have been recorded in the chart.  I have made referrals, counseling, and provided education to the patient based on review of the above and I have provided the patient with a written personalized care plan for preventive services.     Izora Ribas, NP   03/12/2019

## 2019-03-12 ENCOUNTER — Encounter: Payer: Self-pay | Admitting: Adult Health

## 2019-03-12 ENCOUNTER — Ambulatory Visit (INDEPENDENT_AMBULATORY_CARE_PROVIDER_SITE_OTHER): Payer: Medicare HMO | Admitting: Adult Health

## 2019-03-12 ENCOUNTER — Other Ambulatory Visit: Payer: Self-pay

## 2019-03-12 VITALS — BP 138/72 | HR 80 | Temp 97.5°F | Ht 63.0 in | Wt 129.0 lb

## 2019-03-12 DIAGNOSIS — Z79899 Other long term (current) drug therapy: Secondary | ICD-10-CM | POA: Diagnosis not present

## 2019-03-12 DIAGNOSIS — R6889 Other general symptoms and signs: Secondary | ICD-10-CM | POA: Diagnosis not present

## 2019-03-12 DIAGNOSIS — N183 Chronic kidney disease, stage 3 unspecified: Secondary | ICD-10-CM

## 2019-03-12 DIAGNOSIS — Z0001 Encounter for general adult medical examination with abnormal findings: Secondary | ICD-10-CM

## 2019-03-12 DIAGNOSIS — F3342 Major depressive disorder, recurrent, in full remission: Secondary | ICD-10-CM | POA: Diagnosis not present

## 2019-03-12 DIAGNOSIS — E782 Mixed hyperlipidemia: Secondary | ICD-10-CM

## 2019-03-12 DIAGNOSIS — R69 Illness, unspecified: Secondary | ICD-10-CM | POA: Diagnosis not present

## 2019-03-12 DIAGNOSIS — M85839 Other specified disorders of bone density and structure, unspecified forearm: Secondary | ICD-10-CM

## 2019-03-12 DIAGNOSIS — R7309 Other abnormal glucose: Secondary | ICD-10-CM | POA: Diagnosis not present

## 2019-03-12 DIAGNOSIS — Z6822 Body mass index (BMI) 22.0-22.9, adult: Secondary | ICD-10-CM | POA: Diagnosis not present

## 2019-03-12 DIAGNOSIS — E559 Vitamin D deficiency, unspecified: Secondary | ICD-10-CM | POA: Diagnosis not present

## 2019-03-12 DIAGNOSIS — I10 Essential (primary) hypertension: Secondary | ICD-10-CM

## 2019-03-12 DIAGNOSIS — Z0000 Encounter for general adult medical examination without abnormal findings: Secondary | ICD-10-CM

## 2019-03-12 NOTE — Patient Instructions (Addendum)
Monique Barton , Thank you for taking time to come for your Medicare Wellness Visit. I appreciate your ongoing commitment to your health goals. Please review the following plan we discussed and let me know if I can assist you in the future.   These are the goals we discussed: Goals    . DIET - INCREASE WATER INTAKE     Work up to 4+ bottles daily     . Exercise 150 min/wk Moderate Activity       This is a list of the screening recommended for you and due dates:  Health Maintenance  Topic Date Due  . Tetanus Vaccine  04/03/2021  . Flu Shot  Completed  . DEXA scan (bone density measurement)  Completed  . Pneumonia vaccines  Completed    Please schedule an eye exam - any eye office is fine  Eye doctors that you can call, they are all very close to our office   Dr. Bing Plume [phone] Dr. Herbert Deaner [phone] Dr. Delman Cheadle [phone]      Chronic Kidney Disease, Adult Chronic kidney disease (CKD) happens when the kidneys are damaged over a long period of time. The kidneys are two organs that help with:  Getting rid of waste and extra fluid from the blood.  Making hormones that maintain the amount of fluid in your tissues and blood vessels.  Making sure that the body has the right amount of fluids and chemicals. Most of the time, CKD does not go away, but it can usually be controlled. Steps must be taken to slow down the kidney damage or to stop it from getting worse. If this is not done, the kidneys may stop working. Follow these instructions at home: Medicines  Take over-the-counter and prescription medicines only as told by your doctor. You may need to change the amount of medicines you take.  Do not take any new medicines unless your doctor says it is okay. Many medicines can make your kidney damage worse.  Do not take any vitamin and supplements unless your doctor says it is okay. Many vitamins and supplements can make your kidney damage worse. General  instructions  Follow a diet as told by your doctor. You may need to stay away from: ? Alcohol. ? Salty foods. ? Foods that are high in:  Potassium.  Calcium.  Protein.  Do not use any products that contain nicotine or tobacco, such as cigarettes and e-cigarettes. If you need help quitting, ask your doctor.  Keep track of your blood pressure at home. Tell your doctor about any changes.  If you have diabetes, keep track of your blood sugar as told by your doctor.  Try to stay at a healthy weight. If you need help, ask your doctor.  Exercise at least 30 minutes a day, 5 days a week.  Stay up-to-date with your shots (immunizations) as told by your doctor.  Keep all follow-up visits as told by your doctor. This is important. Contact a doctor if:  Your symptoms get worse.  You have new symptoms. Get help right away if:  You have symptoms of end-stage kidney disease. These may include: ? Headaches. ? Numbness in your hands or feet. ? Easy bruising. ? Having hiccups often. ? Chest pain. ? Shortness of breath. ? Stopping of menstrual periods in women.  You have a fever.  You have very little pee (urine).  You have pain or bleeding when you pee. Summary  Chronic kidney disease (CKD) happens when the kidneys are damaged over a long period of time.  Most of the time, this condition does not go away, but it can usually be controlled. Steps must be taken to slow down the kidney damage or to stop it from getting worse.  Treatment may include a combination of medicines and lifestyle changes. This information is not intended to replace advice given to you by your health care provider. Make sure you discuss any questions you have with your health care provider. Document Revised: 12/14/2016 Document Reviewed: 02/06/2016 Elsevier Patient Education  2020 [employer].

## 2019-03-13 LAB — COMPLETE METABOLIC PANEL WITHOUT GFR
AG Ratio: 1.6 (calc) (ref 1.0–2.5)
ALT: 13 U/L (ref 6–29)
AST: 20 U/L (ref 10–35)
Albumin: 4.2 g/dL (ref 3.6–5.1)
Alkaline phosphatase (APISO): 73 U/L (ref 37–153)
BUN/Creatinine Ratio: 18 (calc) (ref 6–22)
BUN: 27 mg/dL — ABNORMAL HIGH (ref 7–25)
CO2: 32 mmol/L (ref 20–32)
Calcium: 9.6 mg/dL (ref 8.6–10.4)
Chloride: 102 mmol/L (ref 98–110)
Creat: 1.5 mg/dL — ABNORMAL HIGH (ref 0.60–0.93)
GFR, Est African American: 38 mL/min/1.73m2 — ABNORMAL LOW
GFR, Est Non African American: 33 mL/min/1.73m2 — ABNORMAL LOW
Globulin: 2.7 g/dL (ref 1.9–3.7)
Glucose, Bld: 89 mg/dL (ref 65–99)
Potassium: 4.5 mmol/L (ref 3.5–5.3)
Sodium: 141 mmol/L (ref 135–146)
Total Bilirubin: 0.5 mg/dL (ref 0.2–1.2)
Total Protein: 6.9 g/dL (ref 6.1–8.1)

## 2019-03-13 LAB — CBC WITH DIFFERENTIAL/PLATELET
Absolute Monocytes: 414 {cells}/uL (ref 200–950)
Basophils Absolute: 31 {cells}/uL (ref 0–200)
Basophils Relative: 0.7 %
Eosinophils Absolute: 202 {cells}/uL (ref 15–500)
Eosinophils Relative: 4.6 %
HCT: 37 % (ref 35.0–45.0)
Hemoglobin: 11.9 g/dL (ref 11.7–15.5)
Lymphs Abs: 1404 {cells}/uL (ref 850–3900)
MCH: 28.9 pg (ref 27.0–33.0)
MCHC: 32.2 g/dL (ref 32.0–36.0)
MCV: 89.8 fL (ref 80.0–100.0)
MPV: 11.3 fL (ref 7.5–12.5)
Monocytes Relative: 9.4 %
Neutro Abs: 2350 {cells}/uL (ref 1500–7800)
Neutrophils Relative %: 53.4 %
Platelets: 216 Thousand/uL (ref 140–400)
RBC: 4.12 Million/uL (ref 3.80–5.10)
RDW: 12.7 % (ref 11.0–15.0)
Total Lymphocyte: 31.9 %
WBC: 4.4 Thousand/uL (ref 3.8–10.8)

## 2019-03-13 LAB — LIPID PANEL
Cholesterol: 198 mg/dL
HDL: 65 mg/dL
LDL Cholesterol (Calc): 115 mg/dL — ABNORMAL HIGH
Non-HDL Cholesterol (Calc): 133 mg/dL — ABNORMAL HIGH
Total CHOL/HDL Ratio: 3 (calc)
Triglycerides: 82 mg/dL

## 2019-03-13 LAB — TSH: TSH: 1.14 m[IU]/L (ref 0.40–4.50)

## 2019-03-13 LAB — MAGNESIUM: Magnesium: 2.2 mg/dL (ref 1.5–2.5)

## 2019-04-24 ENCOUNTER — Ambulatory Visit
Admission: EM | Admit: 2019-04-24 | Discharge: 2019-04-24 | Disposition: A | Discharge status: Home / Self Care | Payer: Medicare HMO | Attending: Emergency Medicine | Admitting: Emergency Medicine

## 2019-04-24 ENCOUNTER — Ambulatory Visit: Payer: Medicare HMO

## 2019-04-24 ENCOUNTER — Ambulatory Visit (INDEPENDENT_AMBULATORY_CARE_PROVIDER_SITE_OTHER): Payer: Medicare HMO

## 2019-04-24 DIAGNOSIS — I480 Paroxysmal atrial fibrillation: Secondary | ICD-10-CM | POA: Diagnosis not present

## 2019-04-24 DIAGNOSIS — J189 Pneumonia, unspecified organism: Secondary | ICD-10-CM

## 2019-04-24 DIAGNOSIS — E860 Dehydration: Secondary | ICD-10-CM | POA: Diagnosis not present

## 2019-04-24 DIAGNOSIS — R0902 Hypoxemia: Secondary | ICD-10-CM

## 2019-04-24 DIAGNOSIS — J90 Pleural effusion, not elsewhere classified: Secondary | ICD-10-CM | POA: Diagnosis not present

## 2019-04-24 DIAGNOSIS — I499 Cardiac arrhythmia, unspecified: Secondary | ICD-10-CM

## 2019-04-24 DIAGNOSIS — E861 Hypovolemia: Secondary | ICD-10-CM

## 2019-04-24 LAB — POC SARS CORONAVIRUS 2 AG -  ED: SARS Coronavirus 2 Ag: NEGATIVE

## 2019-04-24 MED ORDER — AMOXICILLIN-POT CLAVULANATE 500-125 MG PO TABS: 1.0000 | ORAL_TABLET | Freq: Three times a day (TID) | ORAL | 0 refills | Status: DC

## 2019-04-24 MED ORDER — AZITHROMYCIN 250 MG PO TABS: 250.0000 mg | ORAL_TABLET | Freq: Every day | ORAL | 0 refills | Status: DC

## 2019-04-24 MED ORDER — SODIUM CHLORIDE 0.9 % IV BOLUS
1000.0000 mL | Freq: Once | INTRAVENOUS | Status: AC
  Administered 2019-04-24: 1000 mL via INTRAVENOUS

## 2019-04-24 NOTE — ED Triage Notes (Signed)
Pt c/o vomiting since Tuesday evening until 10p last night. States had lower abdominal pain after vomiting lasting a few minutes. States feels better today.

## 2019-04-24 NOTE — Discharge Instructions (Addendum)
Very important to take both antibiotics as directed with food. You need to schedule a 1 month follow-up with your primary care to repeat the chest x-ray and make sure the pneumonia has resolved. Go to ER for worsening cough, fever, chest pain, difficulty breathing, lightheadedness, dizziness, weakness, fatigue.

## 2019-04-24 NOTE — ED Provider Notes (Signed)
[facility]    CSN: [id number] Arrival date & time: 04/24/19  1131      History   Chief Complaint Chief Complaint  Patient presents with  . Emesis    HPI Monique Barton is a 79 y.o. female with history of hypertension, hyperlipidemia presenting for presenting for well check.  Patient states that since Tuesday evening she had been having episodes of emesis without blood or bile.  States last episode was 10:00 last night.  Was able to keep down fluids during that time, though had decreased appetite.  Patient noted lower abdominal pain after vomiting last night, though this only lasted a few minutes.  Patient denies change in urination, bowel movements.  No hematochezia, melena.  Patient denies chest pain, cough, difficulty breathing.  Patient believes she had a fever before she started vomiting, though cannot recall temperature.  No fever currently.  Patient currently denying symptoms.    Past Medical History:  Diagnosis Date  . Hyperlipidemia   . Hypertension   . Other abnormal glucose   . Unspecified vitamin D deficiency   . Wrist fracture, right     Patient Active Problem List   Diagnosis Date Noted  . Osteopenia 06/16/2018  . BMI 22.0-22.9, adult 06/05/2017  . CKD (chronic kidney disease) stage 3, GFR 30-59 ml/min 06/05/2017  . Recurrent major depressive disorder, in full remission ([place]) 04/17/2016  . Encounter for Medicare annual wellness exam 12/28/2014  . Medication management 10/06/2013  . Essential hypertension 11/20/2012  . Hyperlipidemia 11/20/2012  . Other abnormal glucose (prediabetes) 11/20/2012  . Vitamin D deficiency 11/20/2012    Past Surgical History:  Procedure Laterality Date  . EYE SURGERY Bilateral 2012    OB History   No obstetric history on file.      Home Medications    Prior to Admission medications   Medication Sig Start Date End Date Taking? Authorizing Provider  amoxicillin-clavulanate (AUGMENTIN) 500-125 MG tablet Take  1 tablet (500 mg total) by mouth every 8 (eight) hours. 04/24/19   Hall-Potvin, [place], PA-C  aspirin 81 MG tablet Take 81 mg by mouth daily.    [provider]  azithromycin (ZITHROMAX) 250 MG tablet Take 1 tablet (250 mg total) by mouth daily. Take first 2 tablets together, then 1 every day until finished. 04/24/19   Hall-Potvin, [place], PA-C  Cholecalciferol (VITAMIN D PO) Take 5,000 Units by mouth daily.    [provider]  hydrochlorothiazide (HYDRODIURIL) 25 MG tablet Take 1/2 to 1 tablet Daily for BP & Fluid / Swelling 07/18/18   Unk Pinto, MD  losartan (COZAAR) 50 MG tablet Take 1 tablet Daily for BP 12/30/18   Unk Pinto, MD  Magnesium 400 MG TABS Take 1 tablet by mouth daily.    [provider]  pravastatin (PRAVACHOL) 40 MG tablet Take 1 tablet at Bedtime for Cholesterol 03/03/19   Unk Pinto, MD  sertraline (ZOLOFT) 50 MG tablet TAKE 1 TABLET BY MOUTH DAILY FOR MOOD 01/03/19   Liane Comber, NP    Family History Family History  Problem Relation Age of Onset  . Diabetes Mother   . Heart disease Mother   . Hyperlipidemia Mother   . Hypertension Mother   . Cancer Father        PROSTATE  . Diabetes Brother   . Diabetes Brother   . Cancer Brother        LUNG  . Cancer Brother        BONE  Social History Social History   Tobacco Use  . Smoking status: Passive Smoke Exposure - Never Smoker  . Smokeless tobacco: Never Used  . Tobacco comment: Second hand smoke exposure  Substance Use Topics  . Alcohol use: No  . Drug use: No     Allergies   Prednisone   Review of Systems As per HPI   Physical Exam Triage Vital Signs ED Triage Vitals  Enc Vitals Group     BP 04/24/19 1155 (!) 100/57     Pulse Rate 04/24/19 1155 89     Resp 04/24/19 1155 16     Temp 04/24/19 1155 98 F (36.7 C)     Temp Source 04/24/19 1155 Oral     SpO2 04/24/19 1155 (!) 89 %     Weight --      Height --      Head Circumference --       Peak Flow --      Pain Score 04/24/19 1156 0     Pain Loc --      Pain Edu? --      Excl. in [place]? --    No data found.  Updated Vital Signs BP 109/66 (BP Location: Left Arm)   Pulse 89   Temp 98 F (36.7 C) (Oral)   Resp 16   SpO2 90%   Visual Acuity Right Eye Distance:   Left Eye Distance:   Bilateral Distance:    Right Eye Near:   Left Eye Near:    Bilateral Near:     Physical Exam Constitutional:      General: She is not in acute distress.    Appearance: She is normal weight. She is not ill-appearing.  HENT:     Head: Normocephalic and atraumatic.     Mouth/Throat:     Mouth: Mucous membranes are dry.     Pharynx: Oropharynx is clear.  Eyes:     General: No scleral icterus.    Conjunctiva/sclera: Conjunctivae normal.     Pupils: Pupils are equal, round, and reactive to light.  Cardiovascular:     Rate and Rhythm: Normal rate and regular rhythm.     Heart sounds: No murmur. No gallop.   Pulmonary:     Effort: Pulmonary effort is normal. No respiratory distress.     Breath sounds: No wheezing or rales.     Comments: Decreased breath sounds bilaterally Abdominal:     General: Abdomen is flat. Bowel sounds are normal.     Tenderness: There is no abdominal tenderness.  Musculoskeletal:        General: Normal range of motion.     Cervical back: Neck supple. No tenderness.     Right lower leg: No edema.     Left lower leg: No edema.  Lymphadenopathy:     Cervical: No cervical adenopathy.  Skin:    Capillary Refill: Capillary refill takes 2 to 3 seconds.     Coloration: Skin is not jaundiced or pale.     Findings: No rash.  Neurological:     General: No focal deficit present.     Mental Status: She is alert and oriented to person, place, and time.     Cranial Nerves: No cranial nerve deficit.     Motor: No weakness.     Deep Tendon Reflexes: Reflexes normal.      UC Treatments / Results  Labs (all labs ordered are listed, but only abnormal results are  displayed) Labs Reviewed  POC SARS CORONAVIRUS 2 AG -  ED - Normal  NOVEL CORONAVIRUS, NAA    EKG   Radiology DG Chest 2 View  Result Date: 04/24/2019 CLINICAL DATA:  Hypoxia EXAM: CHEST - 2 VIEW COMPARISON:  None. FINDINGS: Normal heart size. Small right pleural effusion. Airspace consolidation in the right lower lobe and right middle lobe identified. Subsegmental atelectasis identified in the left base. IMPRESSION: 1. Right middle lobe and right lower lobe pneumonia with small right pleural effusion. Followup PA and lateral chest X-ray is recommended in 3-4 weeks following trial of antibiotic therapy to ensure resolution and exclude underlying malignancy. 2. Left base atelectasis. Electronically Signed   By: Kerby Moors M.D.   On: 04/24/2019 13:55    Procedures Procedures (including critical care time)  Medications Ordered in UC Medications  sodium chloride 0.9 % bolus 1,000 mL (0 mLs Intravenous Stopped 04/24/19 1322)    Initial Impression / Assessment and Plan / UC Course  I have reviewed the triage vital signs and the nursing notes.  Pertinent labs & imaging results that were available during my care of the patient were reviewed by me and considered in my medical decision making (see chart for details).     Patient afebrile, nontoxic in office today. Patient hypoxic with SPO2 89-92% on room air. Hypotensive (100/57, HR 89) from baseline likely due to dehydration second to poor oral intake with emesis.  Patient given bolus of NS fluids which she tolerated well. Repeat BP 109/66, HR 92.  Patient normocardiac throughout most of visit. Patient had very brief episode of irregular heart rhythm (per auscultation, unable to capture on EKG): ?A. fib second to dehydration.  EKG done in office, reviewed by me and compared to previous from 07/26/2018: NSR with ventricular rate 74 bpm.  No QTC prolongation, ST elevation or depression.  Waveforms stable in all leads.  Nonacute EKG.  Reviewed  findings with patient verbalized understanding. Rapid Covid negative, PCR pending. Chest x-ray done office, reviewed by me and radiology: Significant for right middle lobe and right lower lobe pneumonia with small right pleural effusion. Follow-up PA and lateral chest x-ray recommended 3 to 4 weeks following trial of antibiotic therapy to ensure resolution and exclude underlying malignancy. Reviewed findings with patient verbalized understanding. Per labs from 03/12/2019 calculated CrCl 28 mL/min. Will start Augmentin, azithromycin as outlined below. Patient denying nausea at time of discharge: We will allow patient to focus on oral rehydration and antibiotic therapy for pneumonia. Patient to schedule follow-up with PCP for repeat CXR.  Patient also to follow-up with cardiology for further evaluation of paroxysmal irregular heartbeat: declined starting any medications prior to consult. Patient requiring 2 L O2 via [place] during visit today. Prior to discharge, patient able to ambulate without oxygen desaturation, tachycardia, or fatigue/dyspnea on room air (SpO2 > 90%).  ER return precautions discussed, patient verbalized understanding and is agreeable to plan.  Final Clinical Impressions(s) / UC Diagnoses   Final diagnoses:  Pneumonia of right lower lobe due to infectious organism  Pneumonia of right middle lobe due to infectious organism  Hypoxia  Dehydration  Hypovolemia  Irregular heart beat     Discharge Instructions     Very important to take both antibiotics as directed with food. You need to schedule a 1 month follow-up with your primary care to repeat the chest x-ray and make sure the pneumonia has resolved. Go to ER for worsening cough, fever, chest pain, difficulty breathing, lightheadedness, dizziness, weakness, fatigue.  ED Prescriptions    Medication Sig Dispense Auth. Provider   amoxicillin-clavulanate (AUGMENTIN) 500-125 MG tablet Take 1 tablet (500 mg total) by mouth every 8  (eight) hours. 21 tablet Hall-Potvin, [place], PA-C   azithromycin (ZITHROMAX) 250 MG tablet Take 1 tablet (250 mg total) by mouth daily. Take first 2 tablets together, then 1 every day until finished. 6 tablet Hall-Potvin, [place], PA-C     PDMP not reviewed this encounter.   Hall-Potvin, [place], [place] 04/24/19 1541

## 2019-04-25 LAB — SARS-COV-2, NAA 2 DAY TAT

## 2019-04-25 LAB — NOVEL CORONAVIRUS, NAA: SARS-CoV-2, NAA: NOT DETECTED

## 2019-05-18 NOTE — Progress Notes (Signed)
Assessment and Plan:  Monique Barton was seen today for follow-up.  Diagnoses and all orders for this visit:  Pneumonia of right lower lobe due to infectious organism Pneumonia of right middle lobe due to infectious organism Improved sx following completion of augmentin/zpak Obtain CXR for resoluation  -     DG Chest 2 View; Future  Essential hypertension Continue medication: losartan 50 mg daily, increase HCTZ to 25 mg daily if persistently above goal at home, follow up as scheduled in 1 month Monitor blood pressure at home; call if consistently over 130/80 Continue DASH diet.   Reminder to go to the ER if any CP, SOB, nausea, dizziness, severe HA, changes vision/speech, left arm numbness and tingling and jaw pain.   Further disposition pending results of labs. Discussed med's effects and SE's.   Over 15 minutes of exam, counseling, chart review, and critical decision making was performed.   Future Appointments  Date Time Provider [place]  06/18/2019  9:30 AM Unk Pinto, MD [facility] None  12/21/2019 10:00 AM Unk Pinto, MD [facility] None  03/22/2020  9:00 AM Liane Comber, NP [facility] None    ------------------------------------------------------------------------------------------------------------------   HPI BP (!) 148/72   Pulse 68   Temp 97.6 F (36.4 C)   Wt 133 lb (60.3 kg)   SpO2 98%   BMI 23.56 kg/m   79 y.o.female presents for 1 month follow up for pneumonia which was diagnosed and treated by UC. She presents per their recommendations to pursue follow up CXR for resolution.   She presented to UC on 04/24/2019. Rapid and PCR covid 19 testing were negative at that time. CXR was significant for right middle lobe and right lower lobe pneumonia with small right pleural effusion. Follow-up PA and lateral chest x-ray were recommended 3 to 4 weeks following antibiotic therapy to ensure resolution and exclude underlying malignancy. She was prescribed  augmentin, azithromycin, which she completed, has been feeling fairly well other than some mild residual exertional dyspnea (when using push mower).   She was also recommended to follow up with cardiology for evaluation of paroxysmal irregular heart beat.   IMPRESSION: 1. Right middle lobe and right lower lobe pneumonia with small right pleural effusion. Followup PA and lateral chest X-ray is recommended in 3-4 weeks following trial of antibiotic therapy to ensure resolution and exclude underlying malignancy. 2. Left base atelectasis.   She reports BPs are generally 130s-140/70s, currently taking losartan 50 mg daily, HCTZ 25 mg every other day, today their BP is BP: (!) 148/72  She does workout. She denies chest pain, shortness of breath, dizziness.   Past Medical History:  Diagnosis Date  . Hyperlipidemia   . Hypertension   . Other abnormal glucose   . Unspecified vitamin D deficiency   . Wrist fracture, right      Allergies  Allergen Reactions  . Prednisone Other (See Comments)    HIGH DOSE CAUSES INSOMNIA    Current Outpatient Medications on File Prior to Visit  Medication Sig  . aspirin 81 MG tablet Take 81 mg by mouth daily.  . Cholecalciferol (VITAMIN D PO) Take 5,000 Units by mouth daily.  . hydrochlorothiazide (HYDRODIURIL) 25 MG tablet Take 1/2 to 1 tablet Daily for BP & Fluid / Swelling  . losartan (COZAAR) 50 MG tablet Take 1 tablet Daily for BP  . Magnesium 400 MG TABS Take 1 tablet by mouth daily.  . pravastatin (PRAVACHOL) 40 MG tablet Take 1 tablet at Bedtime for Cholesterol  . sertraline (  ZOLOFT) 50 MG tablet TAKE 1 TABLET BY MOUTH DAILY FOR MOOD   No current facility-administered medications on file prior to visit.    ROS: all negative except above.   Physical Exam:  BP (!) 148/72   Pulse 68   Temp 97.6 F (36.4 C)   Wt 133 lb (60.3 kg)   SpO2 98%   BMI 23.56 kg/m   General Appearance: Well nourished, in no apparent distress. Eyes: PERRLA,  conjunctiva no swelling or erythema ENT/Mouth: No erythema, swelling, or exudate on post pharynx.  Tonsils not swollen or erythematous. Hearing normal.  Neck: Supple Respiratory: Respiratory effort normal, BS equal bilaterally without rales, rhonchi, wheezing or stridor.  Cardio: RRR with no MRGs. Brisk peripheral pulses without edema.  Abdomen: Soft, + BS.  Non tender, no guarding, rebound, hernias, masses. Lymphatics: Non tender without lymphadenopathy.  Musculoskeletal: No obvious deformity, normal gait.  Skin: Warm, dry without rashes, lesions, ecchymosis.  Neuro:Normal muscle tone Psych: Awake and oriented X 3, normal affect, Insight and Judgment appropriate.     Monique Ribas, NP 9:13 AM [facility]

## 2019-05-19 ENCOUNTER — Encounter: Payer: Self-pay | Admitting: Adult Health

## 2019-05-19 ENCOUNTER — Ambulatory Visit
Admission: RE | Admit: 2019-05-19 | Discharge: 2019-05-19 | Disposition: A | Discharge status: Home / Self Care | Payer: Medicare HMO | Source: Ambulatory Visit | Attending: Adult Health | Admitting: Adult Health

## 2019-05-19 ENCOUNTER — Other Ambulatory Visit: Payer: Self-pay

## 2019-05-19 ENCOUNTER — Ambulatory Visit (INDEPENDENT_AMBULATORY_CARE_PROVIDER_SITE_OTHER): Payer: Medicare HMO | Admitting: Adult Health

## 2019-05-19 ENCOUNTER — Other Ambulatory Visit: Payer: Self-pay | Admitting: Adult Health

## 2019-05-19 VITALS — BP 148/72 | HR 68 | Temp 97.6°F | Wt 133.0 lb

## 2019-05-19 DIAGNOSIS — J189 Pneumonia, unspecified organism: Secondary | ICD-10-CM

## 2019-05-19 DIAGNOSIS — R9389 Abnormal findings on diagnostic imaging of other specified body structures: Secondary | ICD-10-CM | POA: Diagnosis not present

## 2019-05-19 DIAGNOSIS — R911 Solitary pulmonary nodule: Secondary | ICD-10-CM

## 2019-05-19 DIAGNOSIS — R0600 Dyspnea, unspecified: Secondary | ICD-10-CM

## 2019-05-19 DIAGNOSIS — I10 Essential (primary) hypertension: Secondary | ICD-10-CM

## 2019-05-19 DIAGNOSIS — R0602 Shortness of breath: Secondary | ICD-10-CM | POA: Diagnosis not present

## 2019-05-19 DIAGNOSIS — Z7722 Contact with and (suspected) exposure to environmental tobacco smoke (acute) (chronic): Secondary | ICD-10-CM

## 2019-05-19 DIAGNOSIS — R0609 Other forms of dyspnea: Secondary | ICD-10-CM

## 2019-05-19 NOTE — Patient Instructions (Addendum)
Goals    . Blood Pressure < 130/80    . DIET - INCREASE WATER INTAKE     Work up to 4+ bottles daily     . Exercise 150 min/wk Moderate Activity       If blood pressure is consistently above goal - increase hydrochlorothiazide to a full tab daily.     HYPERTENSION INFORMATION  Monitor your blood pressure at home, please keep a record and bring that in with you to your next office visit.   Go to the ER if any CP, SOB, nausea, dizziness, severe HA, changes vision/speech  Testing/Procedures: HOW TO TAKE YOUR BLOOD PRESSURE:  Rest 5 minutes before taking your blood pressure.  Don't smoke or drink caffeinated beverages for at least 30 minutes before.  Take your blood pressure before (not after) you eat.  Sit comfortably with your back supported and both feet on the floor (don't cross your legs).  Elevate your arm to heart level on a table or a desk.  Use the proper sized cuff. It should fit smoothly and snugly around your bare upper arm. There should be enough room to slip a fingertip under the cuff. The bottom edge of the cuff should be 1 inch above the crease of the elbow.  Your most recent BP: BP: (!) 148/72   Take your medications faithfully as instructed. Maintain a healthy weight. Get at least 150 minutes of aerobic exercise per week. Minimize salt intake. Minimize alcohol intake  DASH Eating Plan DASH stands for "Dietary Approaches to Stop Hypertension." The DASH eating plan is a healthy eating plan that has been shown to reduce high blood pressure (hypertension). Additional health benefits may include reducing the risk of type 2 diabetes mellitus, heart disease, and stroke. The DASH eating plan may also help with weight loss. WHAT DO I NEED TO KNOW ABOUT THE DASH EATING PLAN? For the DASH eating plan, you will follow these general guidelines:  Choose foods with a percent daily value for sodium of less than 5% (as listed on the food label).  Use salt-free seasonings  or herbs instead of table salt or sea salt.  Check with your health care provider or pharmacist before using salt substitutes.  Eat lower-sodium products, often labeled as "lower sodium" or "no salt added."  Eat fresh foods.  Eat more vegetables, fruits, and low-fat dairy products.  Choose whole grains. Look for the word "whole" as the first word in the ingredient list.  Choose fish and skinless chicken or [place] more often than red meat. Limit fish, poultry, and meat to 6 oz (170 g) each day.  Limit sweets, desserts, sugars, and sugary drinks.  Choose heart-healthy fats.  Limit cheese to 1 oz (28 g) per day.  Eat more home-cooked food and less restaurant, buffet, and fast food.  Limit fried foods.  Cook foods using methods other than frying.  Limit canned vegetables. If you do use them, rinse them well to decrease the sodium.  When eating at a restaurant, ask that your food be prepared with less salt, or no salt if possible. WHAT FOODS CAN I EAT? Seek help from a dietitian for individual calorie needs. Grains Whole grain or whole wheat bread. Brown rice. Whole grain or whole wheat pasta. Quinoa, bulgur, and whole grain cereals. Low-sodium cereals. Corn or whole wheat flour tortillas. Whole grain cornbread. Whole grain crackers. Low-sodium crackers. Vegetables Fresh or frozen vegetables (raw, steamed, roasted, or grilled). Low-sodium or reduced-sodium tomato and vegetable juices. Low-sodium  or reduced-sodium tomato sauce and paste. Low-sodium or reduced-sodium canned vegetables.  Fruits All fresh, canned (in natural juice), or frozen fruits. Meat and Other Protein Products Ground beef (85% or leaner), grass-fed beef, or beef trimmed of fat. Skinless chicken or [place]. Ground chicken or [place]. Pork trimmed of fat. All fish and seafood. Eggs. Dried beans, peas, or lentils. Unsalted nuts and seeds. Unsalted canned beans. Dairy Low-fat dairy products, such as skim or 1% milk, 2%  or reduced-fat cheeses, low-fat ricotta or cottage cheese, or plain low-fat yogurt. Low-sodium or reduced-sodium cheeses. Fats and Oils Tub margarines without trans fats. Light or reduced-fat mayonnaise and salad dressings (reduced sodium). Avocado. Safflower, olive, or canola oils. Natural peanut or almond butter. Other Unsalted popcorn and pretzels. The items listed above may not be a complete list of recommended foods or beverages. Contact your dietitian for more options. WHAT FOODS ARE NOT RECOMMENDED? Grains White bread. White pasta. White rice. Refined cornbread. Bagels and croissants. Crackers that contain trans fat. Vegetables Creamed or fried vegetables. Vegetables in a cheese sauce. Regular canned vegetables. Regular canned tomato sauce and paste. Regular tomato and vegetable juices. Fruits Dried fruits. Canned fruit in light or heavy syrup. Fruit juice. Meat and Other Protein Products Fatty cuts of meat. Ribs, chicken wings, bacon, sausage, bologna, salami, chitterlings, fatback, hot dogs, bratwurst, and packaged luncheon meats. Salted nuts and seeds. Canned beans with salt. Dairy Whole or 2% milk, cream, half-and-half, and cream cheese. Whole-fat or sweetened yogurt. Full-fat cheeses or blue cheese. Nondairy creamers and whipped toppings. Processed cheese, cheese spreads, or cheese curds. Condiments Onion and garlic salt, seasoned salt, table salt, and sea salt. Canned and packaged gravies. Worcestershire sauce. Tartar sauce. Barbecue sauce. Teriyaki sauce. Soy sauce, including reduced sodium. Steak sauce. Fish sauce. Oyster sauce. Cocktail sauce. Horseradish. Ketchup and mustard. Meat flavorings and tenderizers. Bouillon cubes. Hot sauce. Tabasco sauce. Marinades. Taco seasonings. Relishes. Fats and Oils Butter, stick margarine, lard, shortening, ghee, and bacon fat. Coconut, palm kernel, or palm oils. Regular salad dressings. Other Pickles and olives. Salted popcorn and  pretzels. The items listed above may not be a complete list of foods and beverages to avoid. Contact your dietitian for more information. WHERE CAN I FIND MORE INFORMATION? National Heart, Lung, and Blood Institute: travelstabloid.com Document Released: 12/21/2010 Document Revised: 05/18/2013 Document Reviewed: 11/05/2012 [facility] Patient Information 2015 [place], [place]. This information is not intended to replace advice given to you by your health care provider. Make sure you discuss any questions you have with your health care provider.

## 2019-06-03 ENCOUNTER — Other Ambulatory Visit: Payer: Self-pay | Admitting: Adult Health

## 2019-06-03 ENCOUNTER — Ambulatory Visit
Admission: RE | Admit: 2019-06-03 | Discharge: 2019-06-03 | Disposition: A | Discharge status: Home / Self Care | Payer: Medicare HMO | Source: Ambulatory Visit | Attending: Adult Health | Admitting: Adult Health

## 2019-06-03 ENCOUNTER — Other Ambulatory Visit: Payer: Self-pay

## 2019-06-03 DIAGNOSIS — R911 Solitary pulmonary nodule: Secondary | ICD-10-CM

## 2019-06-03 DIAGNOSIS — R0609 Other forms of dyspnea: Secondary | ICD-10-CM

## 2019-06-03 DIAGNOSIS — Z7722 Contact with and (suspected) exposure to environmental tobacco smoke (acute) (chronic): Secondary | ICD-10-CM

## 2019-06-03 DIAGNOSIS — R0600 Dyspnea, unspecified: Secondary | ICD-10-CM

## 2019-06-03 DIAGNOSIS — R9389 Abnormal findings on diagnostic imaging of other specified body structures: Secondary | ICD-10-CM

## 2019-06-03 DIAGNOSIS — J984 Other disorders of lung: Secondary | ICD-10-CM | POA: Diagnosis not present

## 2019-06-03 DIAGNOSIS — J9811 Atelectasis: Secondary | ICD-10-CM | POA: Diagnosis not present

## 2019-06-04 ENCOUNTER — Encounter: Payer: Self-pay | Admitting: Adult Health

## 2019-06-04 DIAGNOSIS — I700 Atherosclerosis of aorta: Secondary | ICD-10-CM | POA: Insufficient documentation

## 2019-06-17 ENCOUNTER — Encounter: Payer: Self-pay | Admitting: Internal Medicine

## 2019-06-17 NOTE — Progress Notes (Addendum)
History of Present Illness:       This very nice 79 y.o. WWF  presents for 6 month follow up with HTN, HLD, Pre-Diabetes and Vitamin D Deficiency.       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 BP has been controlled at home. Todays BP is at goal - 130/64. Patient has had no complaints of any cardiac type chest pain, palpitations, dyspnea / orthopnea / PND, dizziness, claudication, or dependent edema.      Hyperlipidemia is not controlled with diet & Pravastatin. Patient denies myalgias or other med SEs. Last Lipids were not at goal:  Lab Results  Component Value Date   CHOL 198 03/12/2019   HDL 65 03/12/2019   LDLCALC 115 (H) 03/12/2019   TRIG 82 03/12/2019   CHOLHDL 3.0 03/12/2019    Also, the patient has history of PreDiabetes (A1c 5.8% / 2013 &  6.0% / 2016)  and has had no symptoms of reactive hypoglycemia, diabetic polys, paresthesias or visual blurring.  Last A1c was Normal & at goal:  Lab Results  Component Value Date   HGBA1C 5.6 11/27/2018       Further, the patient also has history of Vitamin D Deficiency and supplements vitamin D without any suspected side-effects. Last vitamin D was sl low (70-100):  Lab Results  Component Value Date   VD25OH 52 11/27/2018    Current Outpatient Medications on File Prior to Visit  Medication Sig   Ascorbic Acid (VITAMIN C) 500 MG CAPS Take 1 capsule by mouth. Not on a regular basis   aspirin 81 MG tablet Take 81 mg by mouth daily.   Cholecalciferol (VITAMIN D PO) Take 5,000 Units by mouth daily.   hydrochlorothiazide (HYDRODIURIL) 25 MG tablet Take 1/2 to 1 tablet Daily for BP & Fluid / Swelling   losartan (COZAAR) 50 MG tablet Take 1 tablet Daily for BP   Magnesium 400 MG TABS Take 1 tablet by mouth daily.   OVER THE COUNTER MEDICATION Takes an OTC Iron tablet daily.   pravastatin (PRAVACHOL) 40 MG tablet Take 1 tablet at Bedtime for Cholesterol   sertraline (ZOLOFT) 50 MG tablet TAKE 1  TABLET BY MOUTH DAILY FOR MOOD   No current facility-administered medications on file prior to visit.    Allergies  Allergen Reactions   Prednisone Other (See Comments)    HIGH DOSE CAUSES INSOMNIA    PMHx:   Past Medical History:  Diagnosis Date   Hyperlipidemia    Hypertension    Other abnormal glucose    Unspecified vitamin D deficiency    Wrist fracture, right     Immunization History  Administered Date(s) Administered   Fluad Quad(high Dose 65+) 10/02/2018   Influenza Nasal 11/06/2011   Influenza, High Dose Seasonal PF 10/06/2013, 09/23/2014, 10/30/2017   Influenza-Unspecified 10/24/2012, 10/15/2016   PFIZER SARS-COV-2 Vaccination 02/28/2019   Pneumococcal Conjugate-13 06/08/2014   Pneumococcal-Unspecified 02/07/2006   Tdap 04/04/2011   Zoster 01/15/2009    Past Surgical History:  Procedure Laterality Date   EYE SURGERY Bilateral 2012    FHx:    Reviewed / unchanged  SHx:    Reviewed / unchanged   Systems Review:  Constitutional: Denies fever, chills, wt changes, headaches, insomnia, fatigue, night sweats, change in appetite. Eyes: Denies redness, blurred vision, diplopia, discharge, itchy, watery eyes.  ENT: Denies discharge, congestion, post nasal drip, epistaxis, sore throat, earache, hearing loss, dental pain, tinnitus, vertigo, sinus pain, snoring.  CV: Denies chest pain, palpitations, irregular heartbeat, syncope, dyspnea, diaphoresis, orthopnea, PND, claudication or edema. Respiratory: denies cough, dyspnea, DOE, pleurisy, hoarseness, laryngitis, wheezing.  Gastrointestinal: Denies dysphagia, odynophagia, heartburn, reflux, water brash, abdominal pain or cramps, nausea, vomiting, bloating, diarrhea, constipation, hematemesis, melena, hematochezia  or hemorrhoids. Genitourinary: Denies dysuria, frequency, urgency, nocturia, hesitancy, discharge, hematuria or flank pain. Musculoskeletal: Denies arthralgias, myalgias, stiffness, jt.  swelling, pain, limping or strain/sprain.  Skin: Denies pruritus, rash, hives, warts, acne, eczema or change in skin lesion(s). Neuro: No weakness, tremor, incoordination, spasms, paresthesia or pain. Psychiatric: Denies confusion, memory loss or sensory loss. Endo: Denies change in weight, skin or hair change.  Heme/Lymph: No excessive bleeding, bruising or enlarged lymph nodes.  Physical Exam  BP 130/64    Pulse (!) 56    Temp (!) 97 F (36.1 C)    Resp 16    Ht 5' 3.5" (1.613 m)    Wt 123 lb 12.8 oz (56.2 kg)    BMI 21.59 kg/m   Appears  well nourished, well groomed  and in no distress.  Eyes: PERRLA, EOMs, conjunctiva no swelling or erythema. Sinuses: No frontal/maxillary tenderness ENT/Mouth: EAC's clear, TM's nl w/o erythema, bulging. Nares clear w/o erythema, swelling, exudates. Oropharynx clear without erythema or exudates. Oral hygiene is good. Tongue normal, non obstructing. Hearing intact.  Neck: Supple. Thyroid not palpable. Car 2+/2+ without bruits, nodes or JVD. Chest: Respirations nl with BS clear & equal w/o rales, rhonchi, wheezing or stridor.  Cor: Heart sounds normal w/ regular rate and rhythm without sig. murmurs, gallops, clicks or rubs. Peripheral pulses normal and equal  without edema.  Abdomen: Soft & bowel sounds normal. Non-tender w/o guarding, rebound, hernias, masses or organomegaly.  Lymphatics: Unremarkable.  Musculoskeletal: Full ROM all peripheral extremities, joint stability, 5/5 strength and normal gait.  Skin: Warm, dry without exposed rashes, lesions or ecchymosis apparent.  Neuro: Cranial nerves intact, reflexes equal bilaterally. Sensory-motor testing grossly intact. Tendon reflexes grossly intact.  Pysch: Alert & oriented x 3.  Insight and judgement nl & appropriate. No ideations.  Assessment and Plan:  1. Essential hypertension  - Continue medication, monitor blood pressure at home.  - Continue DASH diet.  Reminder to go to the ER if any CP,    SOB, nausea, dizziness, severe HA, changes vision/speech.  - CBC with Differential/Platelet - COMPLETE METABOLIC PANEL WITH GFR - Magnesium - TSH  2. Hyperlipidemia, mixed  - Continue diet/meds, exercise,& lifestyle modifications.  - Continue monitor periodic cholesterol/liver & renal functions   - Lipid panel - TSH  3. Abnormal glucose  - Continue diet, exercise  - Lifestyle modifications.  - Monitor appropriate labs.  - Hemoglobin A1c - Insulin, random  4. Vitamin D deficiency  - Continue supplementation.  - VITAMIN D 25 Hydroxy  5. Stage 3b chronic kidney disease  - COMPLETE METABOLIC PANEL WITH GFR  6. Prediabetes  - Hemoglobin A1c - Insulin, random  7. Medication management  - CBC with Differential/Platelet - COMPLETE METABOLIC PANEL WITH GFR - Magnesium - Lipid panel - TSH- Hemoglobin A1c - Insulin, random - VITAMIN D 25 Hydroxy         Discussed  regular exercise, BP monitoring, weight control to achieve/maintain BMI less than 25 and discussed med and SE's. Recommended labs to assess and monitor clinical status with further disposition pending results of labs.  I discussed the assessment and treatment plan with the patient. The patient was provided an opportunity to ask questions and all were  answered. The patient agreed with the plan and demonstrated an understanding of the instructions.  I provided over 30 minutes of exam, counseling, chart review and  complex critical decision making.   Kirtland Bouchard, MD

## 2019-06-17 NOTE — Patient Instructions (Signed)

## 2019-06-18 ENCOUNTER — Other Ambulatory Visit: Payer: Self-pay

## 2019-06-18 ENCOUNTER — Ambulatory Visit: Payer: Medicare HMO | Admitting: Internal Medicine

## 2019-06-18 ENCOUNTER — Ambulatory Visit (INDEPENDENT_AMBULATORY_CARE_PROVIDER_SITE_OTHER): Payer: Medicare HMO | Admitting: Internal Medicine

## 2019-06-18 VITALS — BP 130/64 | HR 56 | Temp 97.0°F | Resp 16 | Ht 63.5 in | Wt 123.8 lb

## 2019-06-18 DIAGNOSIS — I10 Essential (primary) hypertension: Secondary | ICD-10-CM | POA: Diagnosis not present

## 2019-06-18 DIAGNOSIS — R7309 Other abnormal glucose: Secondary | ICD-10-CM

## 2019-06-18 DIAGNOSIS — E782 Mixed hyperlipidemia: Secondary | ICD-10-CM

## 2019-06-18 DIAGNOSIS — R7303 Prediabetes: Secondary | ICD-10-CM | POA: Diagnosis not present

## 2019-06-18 DIAGNOSIS — E559 Vitamin D deficiency, unspecified: Secondary | ICD-10-CM

## 2019-06-18 DIAGNOSIS — Z79899 Other long term (current) drug therapy: Secondary | ICD-10-CM | POA: Diagnosis not present

## 2019-06-18 DIAGNOSIS — N1832 Chronic kidney disease, stage 3b: Secondary | ICD-10-CM | POA: Diagnosis not present

## 2019-06-19 LAB — LIPID PANEL
Cholesterol: 178 mg/dL (ref <200)
HDL: 64 mg/dL (ref >=50)
LDL Cholesterol (Calc): 96 mg/dL (calc)
Non-HDL Cholesterol (Calc): 114 mg/dL (calc) (ref <130)
Total CHOL/HDL Ratio: 2.8 (calc) (ref <5.0)
Triglycerides: 90 mg/dL (ref <150)

## 2019-06-19 LAB — CBC WITH DIFFERENTIAL/PLATELET
Absolute Monocytes: 457 cells/uL (ref 200–950)
Basophils Absolute: 39 cells/uL (ref 0–200)
Basophils Relative: 0.7 %
Eosinophils Absolute: 209 cells/uL (ref 15–500)
Eosinophils Relative: 3.8 %
HCT: 34.9 % — ABNORMAL LOW (ref 35.0–45.0)
Hemoglobin: 11.3 g/dL — ABNORMAL LOW (ref 11.7–15.5)
Lymphs Abs: 1535 cells/uL (ref 850–3900)
MCH: 29.3 pg (ref 27.0–33.0)
MCHC: 32.4 g/dL (ref 32.0–36.0)
MCV: 90.4 fL (ref 80.0–100.0)
MPV: 11.5 fL (ref 7.5–12.5)
Monocytes Relative: 8.3 %
Neutro Abs: 3262 cells/uL (ref 1500–7800)
Neutrophils Relative %: 59.3 %
Platelets: 216 10*3/uL (ref 140–400)
RBC: 3.86 10*6/uL (ref 3.80–5.10)
RDW: 13.4 % (ref 11.0–15.0)
Total Lymphocyte: 27.9 %
WBC: 5.5 10*3/uL (ref 3.8–10.8)

## 2019-06-19 LAB — TSH: TSH: 1.51 mIU/L (ref 0.40–4.50)

## 2019-06-19 LAB — COMPLETE METABOLIC PANEL WITH GFR
AG Ratio: 1.7 (calc) (ref 1.0–2.5)
ALT: 12 U/L (ref 6–29)
AST: 22 U/L (ref 10–35)
Albumin: 4.2 g/dL (ref 3.6–5.1)
Alkaline phosphatase (APISO): 70 U/L (ref 37–153)
BUN/Creatinine Ratio: 18 (calc) (ref 6–22)
BUN: 31 mg/dL — ABNORMAL HIGH (ref 7–25)
CO2: 29 mmol/L (ref 20–32)
Calcium: 9.4 mg/dL (ref 8.6–10.4)
Chloride: 103 mmol/L (ref 98–110)
Creat: 1.76 mg/dL — ABNORMAL HIGH (ref 0.60–0.93)
GFR, Est African American: 31 mL/min/{1.73_m2} — ABNORMAL LOW (ref >=60)
GFR, Est Non African American: 27 mL/min/{1.73_m2} — ABNORMAL LOW (ref >=60)
Globulin: 2.5 g/dL (calc) (ref 1.9–3.7)
Glucose, Bld: 87 mg/dL (ref 65–99)
Potassium: 4.3 mmol/L (ref 3.5–5.3)
Sodium: 140 mmol/L (ref 135–146)
Total Bilirubin: 0.5 mg/dL (ref 0.2–1.2)
Total Protein: 6.7 g/dL (ref 6.1–8.1)

## 2019-06-19 LAB — HEMOGLOBIN A1C
Hgb A1c MFr Bld: 5.6 % of total Hgb (ref <5.7)
Mean Plasma Glucose: 114 (calc)
eAG (mmol/L): 6.3 (calc)

## 2019-06-19 LAB — MAGNESIUM: Magnesium: 2.4 mg/dL (ref 1.5–2.5)

## 2019-06-19 LAB — VITAMIN D 25 HYDROXY (VIT D DEFICIENCY, FRACTURES): Vit D, 25-Hydroxy: 52 ng/mL (ref 30–100)

## 2019-06-19 LAB — INSULIN, RANDOM: Insulin: 5 u[IU]/mL

## 2019-07-07 DIAGNOSIS — L821 Other seborrheic keratosis: Secondary | ICD-10-CM | POA: Diagnosis not present

## 2019-07-07 DIAGNOSIS — D1801 Hemangioma of skin and subcutaneous tissue: Secondary | ICD-10-CM | POA: Diagnosis not present

## 2019-07-07 DIAGNOSIS — L720 Epidermal cyst: Secondary | ICD-10-CM | POA: Diagnosis not present

## 2019-07-07 DIAGNOSIS — L565 Disseminated superficial actinic porokeratosis (DSAP): Secondary | ICD-10-CM | POA: Diagnosis not present

## 2019-07-07 DIAGNOSIS — D0461 Carcinoma in situ of skin of right upper limb, including shoulder: Secondary | ICD-10-CM | POA: Diagnosis not present

## 2019-07-07 DIAGNOSIS — L814 Other melanin hyperpigmentation: Secondary | ICD-10-CM | POA: Diagnosis not present

## 2019-07-07 DIAGNOSIS — L570 Actinic keratosis: Secondary | ICD-10-CM | POA: Diagnosis not present

## 2019-07-07 DIAGNOSIS — Z85828 Personal history of other malignant neoplasm of skin: Secondary | ICD-10-CM | POA: Diagnosis not present

## 2019-09-10 DIAGNOSIS — H52223 Regular astigmatism, bilateral: Secondary | ICD-10-CM | POA: Diagnosis not present

## 2019-09-10 DIAGNOSIS — H524 Presbyopia: Secondary | ICD-10-CM | POA: Diagnosis not present

## 2019-09-10 DIAGNOSIS — H5211 Myopia, right eye: Secondary | ICD-10-CM | POA: Diagnosis not present

## 2019-09-22 ENCOUNTER — Ambulatory Visit: Payer: Medicare HMO | Admitting: Physician Assistant

## 2019-09-30 NOTE — Progress Notes (Signed)
FOLLOW UP  Assessment:   Stress incontinence Do HCTZ every other day, declines pelvic PT at this time No back pain, leg issues, fever, chills. No constipation   Essential hypertension Continue medication Monitor blood pressure at home; call if consistently over 130/80 Continue DASH diet.   Reminder to go to the ER if any CP, SOB, nausea, dizziness, severe HA, changes vision/speech, left arm numbness and tingling and jaw pain.  Vitamin D deficiency Continue supplementation Check vitamin D level as needed  Recurrent major depressive disorder, in full remission ([place]) Continue medications  Lifestyle discussed: diet/exerise, sleep hygiene, stress management, hydration  Other abnormal glucose/Prediabetes Recent A1Cs at goal Discussed diet/exercise, weight management  Defer A1C; check CMP  Medication management CBC, CMP/GFR, magnesium   Hyperlipidemia Continue medication Continue low cholesterol diet and exercise.  Check lipid panel.   CKD (chronic kidney disease) stage 3, GFR 30-59 ml/min (HCC) Increase fluids, will try to do the HCTZ every other day, avoid NSAIDS, monitor sugars, will monitor Check PTH Refer to nephrology if GFR persists <30 this visit  Over 30 minutes of exam, counseling, chart review and critical decision making was performed Future Appointments  Date Time Provider [place]  01/28/2020  2:00 PM Unk Pinto, MD [facility] None  03/22/2020  9:00 AM Liane Comber, NP [facility] None    Subjective:  Monique Barton is a 79 y.o. female who presents for  3 month follow up.   She has hx of depression and remains in remission on zoloft 50 mg daily.   She is on iron, D 3, magnesium, and occ vitamin C.   BMI is Body mass index is 21.97 kg/m., she has been working on diet and exercise. She was doing line dancing but with covid has not, she is mowing yard and walking occ.  Wt Readings from Last 3 Encounters:  10/01/19 126 lb (57.2 kg)   06/18/19 123 lb 12.8 oz (56.2 kg)  05/19/19 133 lb (60.3 kg)    Her blood pressure has been controlled at home (130s/70s), today their BP is BP: 124/72 She does workout. She denies chest pain, shortness of breath, dizziness.   She is on cholesterol medication and denies myalgias. Her cholesterol is at goal. The cholesterol last visit was:   Lab Results  Component Value Date   CHOL 178 06/18/2019   HDL 64 06/18/2019   LDLCALC 96 06/18/2019   TRIG 90 06/18/2019   CHOLHDL 2.8 06/18/2019    She has been working on diet and exercise for glucose management (recent hx of prediabetes), and denies foot ulcerations, increased appetite, nausea, paresthesia of the feet, polydipsia, polyuria, visual disturbances, vomiting and weight loss. Last A1C in the office was:  Lab Results  Component Value Date   HGBA1C 5.6 06/18/2019   Last GFR:  Lab Results  Component Value Date   GFRNONAA 27 (L) 06/18/2019  She drinks ~2 bottles daily.  For the the last 2-3 months she has been having worsening urinary incontinence. She states she can just have used the restroom but with lifting something or push mowing, she will have some urinary incontinence.  No incontinence at night but will have nocturia x 1 (between 2-3 AM). No fever, chills, no urinary retention, no back pain, pain down her legs, numbness tingling in her legs.  No constipation, no vaginal dryness.   Patient is on Vitamin D supplement and near goal of 60-100:    Lab Results  Component Value Date   VD25OH 52 06/18/2019  Medication Review:   Current Outpatient Medications (Cardiovascular):    hydrochlorothiazide (HYDRODIURIL) 25 MG tablet, Take 1/2 to 1 tablet Daily for BP & Fluid / Swelling   losartan (COZAAR) 50 MG tablet, Take 1 tablet Daily for BP   pravastatin (PRAVACHOL) 40 MG tablet, Take 1 tablet at Bedtime for Cholesterol   Current Outpatient Medications (Analgesics):    aspirin 81 MG tablet, Take 81 mg by mouth  daily.   Current Outpatient Medications (Other):    Ascorbic Acid (VITAMIN C) 500 MG CAPS, Take 1 capsule by mouth. Not on a regular basis   Cholecalciferol (VITAMIN D PO), Take 5,000 Units by mouth daily.   Magnesium 400 MG TABS, Take 1 tablet by mouth daily.   OVER THE COUNTER MEDICATION, Takes an OTC Iron tablet daily.   sertraline (ZOLOFT) 50 MG tablet, TAKE 1 TABLET BY MOUTH DAILY FOR MOOD  Current Problems (verified) Patient Active Problem List   Diagnosis Date Noted   Aortic atherosclerosis ([place]) 06/04/2019   Osteopenia 06/16/2018   BMI 22.0-22.9, adult 06/05/2017   CKD (chronic kidney disease) stage 3, GFR 30-59 ml/min 06/05/2017   Recurrent major depressive disorder, in full remission ([place]) 04/17/2016   Encounter for Medicare annual wellness exam 12/28/2014   Medication management 10/06/2013   Essential hypertension 11/20/2012   Hyperlipidemia 11/20/2012   Other abnormal glucose (prediabetes) 11/20/2012   Vitamin D deficiency 11/20/2012    SURGICAL HISTORY She  has a past surgical history that includes Eye surgery (Bilateral, 2012). FAMILY HISTORY Her family history includes Cancer in her brother, brother, and father; Diabetes in her brother, brother, and mother; Heart disease in her mother; Hyperlipidemia in her mother; Hypertension in her mother. SOCIAL HISTORY She  reports that she is a non-smoker but has been exposed to tobacco smoke. She has never used smokeless tobacco. She reports that she does not drink alcohol and does not use drugs.  Review of Systems  Constitutional: Negative for malaise/fatigue and weight loss.  HENT: Negative for hearing loss and tinnitus.   Eyes: Negative for blurred vision and double vision.  Respiratory: Negative for cough, sputum production, shortness of breath and wheezing.   Cardiovascular: Negative for chest pain, palpitations, orthopnea, claudication, leg swelling and PND.  Gastrointestinal: Negative for abdominal  pain, blood in stool, constipation, diarrhea, heartburn, melena, nausea and vomiting.  Genitourinary: Negative.   Musculoskeletal: Negative for falls, joint pain and myalgias.  Skin: Negative for rash.  Neurological: Negative for dizziness, tingling, sensory change, weakness and headaches.  Endo/Heme/Allergies: Negative for polydipsia.  Psychiatric/Behavioral: Negative.  Negative for depression, memory loss, substance abuse and suicidal ideas. The patient is not nervous/anxious and does not have insomnia.   All other systems reviewed and are negative.    Objective:     Today's Vitals   10/01/19 1105  BP: 124/72  Pulse: (!) 57  Temp: 97.7 F (36.5 C)  SpO2: 98%  Weight: 126 lb (57.2 kg)   Body mass index is 21.97 kg/m.  General appearance: alert, no distress, WD/WN, female HEENT: normocephalic, sclerae anicteric, TMs pearly, nares patent, no discharge or erythema, mask in place; oral exam deferred. Hearing normal.  Neck: supple, no lymphadenopathy, no thyromegaly, no masses Heart: RRR, normal S1, S2, no murmurs Lungs: CTA bilaterally, no wheezes, rhonchi, or rales Abdomen: +bs, soft, non tender, non distended, no masses, no hepatomegaly, no splenomegaly Musculoskeletal: nontender, no swelling, no obvious deformity Extremities: no edema, no cyanosis, no clubbing Pulses: 2+ symmetric, upper and lower extremities, normal cap refill  Neurological: alert, oriented x 3, CN2-12 intact, strength normal upper extremities and lower extremities, sensation normal throughout, DTRs 2+ throughout, no cerebellar signs, gait normal Psychiatric: normal affect, behavior normal, pleasant    Vicie Mutters, PA-C   10/01/2019

## 2019-10-01 ENCOUNTER — Ambulatory Visit (INDEPENDENT_AMBULATORY_CARE_PROVIDER_SITE_OTHER): Payer: Medicare HMO | Admitting: Physician Assistant

## 2019-10-01 ENCOUNTER — Other Ambulatory Visit: Payer: Self-pay

## 2019-10-01 ENCOUNTER — Encounter: Payer: Self-pay | Admitting: Physician Assistant

## 2019-10-01 VITALS — BP 124/72 | HR 57 | Temp 97.7°F | Wt 126.0 lb

## 2019-10-01 DIAGNOSIS — E782 Mixed hyperlipidemia: Secondary | ICD-10-CM | POA: Diagnosis not present

## 2019-10-01 DIAGNOSIS — I700 Atherosclerosis of aorta: Secondary | ICD-10-CM | POA: Diagnosis not present

## 2019-10-01 DIAGNOSIS — R69 Illness, unspecified: Secondary | ICD-10-CM | POA: Diagnosis not present

## 2019-10-01 DIAGNOSIS — F3342 Major depressive disorder, recurrent, in full remission: Secondary | ICD-10-CM

## 2019-10-01 DIAGNOSIS — I10 Essential (primary) hypertension: Secondary | ICD-10-CM

## 2019-10-01 DIAGNOSIS — N1832 Chronic kidney disease, stage 3b: Secondary | ICD-10-CM

## 2019-10-01 NOTE — Patient Instructions (Addendum)
Try the HCTZ every other day Monitor for swelling and monitor your blood pressure See if this helps the urine, if not can refer to pelvic floor physical therapy   Urinary Incontinence  Urinary incontinence refers to a condition in which a person is unable to control where and when to pass urine. A person with this condition will urinate when he or she does not mean to (involuntarily). What are the causes? This condition may be caused by:  Medicines.  Infections.  Constipation.  Overactive bladder muscles.  Weak bladder muscles.  Weak pelvic floor muscles. These muscles provide support for the bladder, intestine, and, in women, the uterus.  Enlarged prostate in men. The prostate is a gland near the bladder. When it gets too big, it can pinch the urethra. With the urethra blocked, the bladder can weaken and lose the ability to empty properly.  Surgery.  Emotional factors, such as anxiety, stress, or post-traumatic stress disorder (PTSD).  Pelvic organ prolapse. This happens in women when organs shift out of place and into the vagina. This shift can prevent the bladder and urethra from working properly. What increases the risk? The following factors may make you more likely to develop this condition:  Older age.  Obesity and physical inactivity.  Pregnancy and childbirth.  Menopause.  Diseases that affect the nerves or spinal cord (neurological diseases).  Long-term (chronic) coughing. This can increase pressure on the bladder and pelvic floor muscles. What are the signs or symptoms? Symptoms may vary depending on the type of urinary incontinence you have. They include:  A sudden urge to urinate, but passing urine involuntarily before you can get to a bathroom (urge incontinence).  Suddenly passing urine with any activity that forces urine to pass, such as coughing, laughing, exercise, or sneezing (stress incontinence).  Needing to urinate often, but urinating only a  small amount, or constantly dribbling urine (overflow incontinence).  Urinating because you cannot get to the bathroom in time due to a physical disability, such as arthritis or injury, or communication and thinking problems, such as Alzheimer disease (functional incontinence). How is this diagnosed? This condition may be diagnosed based on:  Your medical history.  A physical exam.  Tests, such as: ? Urine tests. ? X-rays of your kidney and bladder. ? Ultrasound. ? CT scan. ? Cystoscopy. In this procedure, a health care provider inserts a tube with a light and camera (cystoscope) through the urethra and into the bladder in order to check for problems. ? Urodynamic testing. These tests assess how well the bladder, urethra, and sphincter can store and release urine. There are different types of urodynamic tests, and they vary depending on what the test is measuring. To help diagnose your condition, your health care provider may recommend that you keep a log of when you urinate and how much you urinate. How is this treated? Treatment for this condition depends on the type of incontinence that you have and its cause. Treatment may include:  Lifestyle changes, such as: ? Quitting smoking. ? Maintaining a healthy weight. ? Staying active. Try to get 150 minutes of moderate-intensity exercise every week. Ask your health care provider which activities are safe for you. ? Eating a healthy diet.  Avoid high-fat foods, like fried foods.  Avoid refined carbohydrates like white bread and white rice.  Limit how much alcohol and caffeine you drink.  Increase your fiber intake. Foods such as fresh fruits, vegetables, beans, and whole grains are healthy sources of fiber.  Pelvic floor muscle exercises.  Bladder training, such as lengthening the amount of time between bathroom breaks, or using the bathroom at regular intervals.  Using techniques to suppress bladder urges. This can include  distraction techniques or controlled breathing exercises.  Medicines to relax the bladder muscles and prevent bladder spasms.  Medicines to help slow or prevent the growth of a man's prostate.  Botox injections. These can help relax the bladder muscles.  Using pulses of electricity to help change bladder reflexes (electrical nerve stimulation).  For women, using a medical device to prevent urine leaks. This is a small, tampon-like, disposable device that is inserted into the urethra.  Injecting collagen or carbon beads (bulking agents) into the urinary sphincter. These can help thicken tissue and close the bladder opening.  Surgery. Follow these instructions at home: Lifestyle  Limit alcohol and caffeine. These can fill your bladder quickly and irritate it.  Keep yourself clean to help prevent odors and skin damage. Ask your doctor about special skin creams and cleansers that can protect the skin from urine.  Consider wearing pads or adult diapers. Make sure to change them regularly, and always change them right after experiencing incontinence. General instructions  Take over-the-counter and prescription medicines only as told by your health care provider.  Use the bathroom about every 3-4 hours, even if you do not feel the need to urinate. Try to empty your bladder completely every time. After urinating, wait a minute. Then try to urinate again.  Make sure you are in a relaxed position while urinating.  If your incontinence is caused by nerve problems, keep a log of the medicines you take and the times you go to the bathroom.  Keep all follow-up visits as told by your health care provider. This is important. Contact a health care provider if:  You have pain that gets worse.  Your incontinence gets worse. Get help right away if:  You have a fever or chills.  You are unable to urinate.  You have redness in your groin area or down your legs. Summary  Urinary incontinence  refers to a condition in which a person is unable to control where and when to pass urine.  This condition may be caused by medicines, infection, weak bladder muscles, weak pelvic floor muscles, enlargement of the prostate (in men), or surgery.  The following factors increase your risk for developing this condition: older age, obesity, pregnancy and childbirth, menopause, neurological diseases, and chronic coughing.  There are several types of urinary incontinence. They include urge incontinence, stress incontinence, overflow incontinence, and functional incontinence.  This condition is usually treated first with lifestyle and behavioral changes, such as quitting smoking, eating a healthier diet, and doing regular pelvic floor exercises. Other treatment options include medicines, bulking agents, medical devices, electrical nerve stimulation, or surgery. This information is not intended to replace advice given to you by your health care provider. Make sure you discuss any questions you have with your health care provider. Document Revised: 01/11/2017 Document Reviewed: 04/12/2016 Elsevier Patient Education  [place].

## 2019-10-02 LAB — LIPID PANEL
Cholesterol: 169 mg/dL (ref <200)
HDL: 58 mg/dL (ref >=50)
LDL Cholesterol (Calc): 92 mg/dL (calc)
Non-HDL Cholesterol (Calc): 111 mg/dL (calc) (ref <130)
Total CHOL/HDL Ratio: 2.9 (calc) (ref <5.0)
Triglycerides: 91 mg/dL (ref <150)

## 2019-10-02 LAB — COMPLETE METABOLIC PANEL WITH GFR
AG Ratio: 1.8 (calc) (ref 1.0–2.5)
ALT: 17 U/L (ref 6–29)
AST: 21 U/L (ref 10–35)
Albumin: 4.2 g/dL (ref 3.6–5.1)
Alkaline phosphatase (APISO): 81 U/L (ref 37–153)
BUN/Creatinine Ratio: 25 (calc) — ABNORMAL HIGH (ref 6–22)
BUN: 42 mg/dL — ABNORMAL HIGH (ref 7–25)
CO2: 31 mmol/L (ref 20–32)
Calcium: 9.4 mg/dL (ref 8.6–10.4)
Chloride: 103 mmol/L (ref 98–110)
Creat: 1.66 mg/dL — ABNORMAL HIGH (ref 0.60–0.93)
GFR, Est African American: 34 mL/min/{1.73_m2} — ABNORMAL LOW (ref >=60)
GFR, Est Non African American: 29 mL/min/{1.73_m2} — ABNORMAL LOW (ref >=60)
Globulin: 2.3 g/dL (calc) (ref 1.9–3.7)
Glucose, Bld: 89 mg/dL (ref 65–99)
Potassium: 4.8 mmol/L (ref 3.5–5.3)
Sodium: 140 mmol/L (ref 135–146)
Total Bilirubin: 0.4 mg/dL (ref 0.2–1.2)
Total Protein: 6.5 g/dL (ref 6.1–8.1)

## 2019-10-02 LAB — CBC WITH DIFFERENTIAL/PLATELET
Absolute Monocytes: 383 cells/uL (ref 200–950)
Basophils Absolute: 31 cells/uL (ref 0–200)
Basophils Relative: 0.6 %
Eosinophils Absolute: 168 cells/uL (ref 15–500)
Eosinophils Relative: 3.3 %
HCT: 34.7 % — ABNORMAL LOW (ref 35.0–45.0)
Hemoglobin: 11.3 g/dL — ABNORMAL LOW (ref 11.7–15.5)
Lymphs Abs: 1622 cells/uL (ref 850–3900)
MCH: 29.6 pg (ref 27.0–33.0)
MCHC: 32.6 g/dL (ref 32.0–36.0)
MCV: 90.8 fL (ref 80.0–100.0)
MPV: 11.7 fL (ref 7.5–12.5)
Monocytes Relative: 7.5 %
Neutro Abs: 2897 cells/uL (ref 1500–7800)
Neutrophils Relative %: 56.8 %
Platelets: 202 10*3/uL (ref 140–400)
RBC: 3.82 10*6/uL (ref 3.80–5.10)
RDW: 12.7 % (ref 11.0–15.0)
Total Lymphocyte: 31.8 %
WBC: 5.1 10*3/uL (ref 3.8–10.8)

## 2019-10-02 LAB — TSH: TSH: 0.98 mIU/L (ref 0.40–4.50)

## 2019-10-02 LAB — PTH, INTACT AND CALCIUM
Calcium: 9.4 mg/dL (ref 8.6–10.4)
PTH: 29 pg/mL (ref 14–64)

## 2019-10-08 DIAGNOSIS — R69 Illness, unspecified: Secondary | ICD-10-CM | POA: Diagnosis not present

## 2019-12-21 ENCOUNTER — Encounter: Payer: Medicare HMO | Admitting: Internal Medicine

## 2019-12-29 DIAGNOSIS — L814 Other melanin hyperpigmentation: Secondary | ICD-10-CM | POA: Diagnosis not present

## 2019-12-29 DIAGNOSIS — L570 Actinic keratosis: Secondary | ICD-10-CM | POA: Diagnosis not present

## 2019-12-29 DIAGNOSIS — Z85828 Personal history of other malignant neoplasm of skin: Secondary | ICD-10-CM | POA: Diagnosis not present

## 2019-12-29 DIAGNOSIS — L821 Other seborrheic keratosis: Secondary | ICD-10-CM | POA: Diagnosis not present

## 2019-12-30 ENCOUNTER — Other Ambulatory Visit: Payer: Self-pay | Admitting: Adult Health

## 2020-01-01 ENCOUNTER — Other Ambulatory Visit: Payer: Self-pay | Admitting: Internal Medicine

## 2020-01-28 ENCOUNTER — Encounter: Payer: Self-pay | Admitting: Internal Medicine

## 2020-01-28 ENCOUNTER — Other Ambulatory Visit: Payer: Self-pay

## 2020-01-28 ENCOUNTER — Ambulatory Visit (INDEPENDENT_AMBULATORY_CARE_PROVIDER_SITE_OTHER): Payer: Medicare HMO | Admitting: Internal Medicine

## 2020-01-28 VITALS — BP 136/72 | HR 62 | Temp 97.2°F | Resp 16 | Ht 63.5 in | Wt 126.6 lb

## 2020-01-28 DIAGNOSIS — R7309 Other abnormal glucose: Secondary | ICD-10-CM

## 2020-01-28 DIAGNOSIS — E559 Vitamin D deficiency, unspecified: Secondary | ICD-10-CM

## 2020-01-28 DIAGNOSIS — Z136 Encounter for screening for cardiovascular disorders: Secondary | ICD-10-CM

## 2020-01-28 DIAGNOSIS — Z79899 Other long term (current) drug therapy: Secondary | ICD-10-CM

## 2020-01-28 DIAGNOSIS — Z1211 Encounter for screening for malignant neoplasm of colon: Secondary | ICD-10-CM

## 2020-01-28 DIAGNOSIS — Z0000 Encounter for general adult medical examination without abnormal findings: Secondary | ICD-10-CM

## 2020-01-28 DIAGNOSIS — I129 Hypertensive chronic kidney disease with stage 1 through stage 4 chronic kidney disease, or unspecified chronic kidney disease: Secondary | ICD-10-CM | POA: Diagnosis not present

## 2020-01-28 DIAGNOSIS — E782 Mixed hyperlipidemia: Secondary | ICD-10-CM

## 2020-01-28 DIAGNOSIS — N1832 Chronic kidney disease, stage 3b: Secondary | ICD-10-CM

## 2020-01-28 DIAGNOSIS — I700 Atherosclerosis of aorta: Secondary | ICD-10-CM | POA: Diagnosis not present

## 2020-01-28 DIAGNOSIS — I10 Essential (primary) hypertension: Secondary | ICD-10-CM | POA: Diagnosis not present

## 2020-01-28 DIAGNOSIS — Z8249 Family history of ischemic heart disease and other diseases of the circulatory system: Secondary | ICD-10-CM

## 2020-01-28 DIAGNOSIS — Z0001 Encounter for general adult medical examination with abnormal findings: Secondary | ICD-10-CM

## 2020-01-28 NOTE — Patient Instructions (Signed)

## 2020-01-28 NOTE — Progress Notes (Signed)
Annual Screening/Preventative Visit & Comprehensive Evaluation &  Examination      This very nice 80 y.o.  WWF presents for a Screening /Preventative Visit & comprehensive evaluation and management of multiple medical co-morbidities.  Patient has been followed for HTN, HLD, Prediabetes  and Vitamin D Deficiency.       HTN predates since 2005. Patient's BP has been controlled at home. 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 Today's BP was initially slightly elevated & rechecked at goal - 136/72.      Patient's hyperlipidemia is controlled with diet and medications. Patient denies myalgias or other medication SE's. Last lipids were at goal:  Lab Results  Component Value Date   CHOL 169 10/01/2019   HDL 58 10/01/2019   LDLCALC 92 10/01/2019   TRIG 91 10/01/2019   CHOLHDL 2.9 10/01/2019        Patient has hx/o prediabetes (A1c 5.8% /2013 &  6.0% /2016) and patient denies reactive hypoglycemic symptoms, visual blurring, diabetic polys or paresthesias. Last A1c was normal & at goal:  Lab Results  Component Value Date   HGBA1C 5.6 06/18/2019        Finally, patient has history of Vitamin D Deficiency and last Vitamin D was  neatr goal:  Lab Results  Component Value Date   VD25OH 52 06/18/2019    Current Outpatient Medications on File Prior to Visit  Medication Sig  . VITAMIN C 500 MG CAPS Take 1 capsule sporadic.  Marland Kitchen aspirin 81 MG tablet Take  daily.  Marland Kitchen VITAMIN D  Take 5,000 Units  daily.  . hydrochlorothiazide  25 MG tablet Take 1/2 to 1 tablet Daily   . losartan  50 MG tablet TAKE 1 TABLET DAILY   . Magnesium 400 MG TABS Take 1 tablet daily.  . OTC Iron tablet  Takes  daily.  . pravastatin  40 MG tablet Take 1 tablet at Bedtime for Cholesterol  . sertraline 50 MG tablet Take     1 tablet     Daily      for Mood  . zinc  50 MG tablet Take  daily.     Allergies  Allergen Reactions  . Prednisone HIGH DOSE CAUSES INSOMNIA    Past Medical History:  Diagnosis Date  . Hyperlipidemia   . Hypertension   . Other abnormal glucose   . Unspecified vitamin D deficiency   . Wrist fracture, right    Health Maintenance  Topic Date Due  . Hepatitis C Screening  Never done  . COVID-19 Vaccine (3 - Pfizer risk 4-dose series) 04/22/2019  . TETANUS/TDAP  04/03/2021  . INFLUENZA VACCINE  Completed  . DEXA SCAN  Completed  . PNA vac Low Risk Adult  Completed   Immunization History  Administered Date(s) Administered  . Fluad Quad(high Dose 65+) 10/02/2018  . Influenza Nasal 11/06/2011  . Influenza, High Dose Seasonal PF 10/30/2017, 10/08/2019  . Influenza-Unspecified 10/24/2012, 10/15/2016  . PFIZER SARS-COV-2 Vaccination 02/28/2019, 03/25/2019  . Pneumococcal Conjugate-13 06/08/2014  . Pneumococcal-23 02/07/2006  . Tdap 04/04/2011  . Zoster 01/15/2009    Cologard - 07/29/2018 - Negative - Recc 3 yr f/u due July 2023  Last MGM & dexaBMD - 09/25/2018  Past Surgical History:  Procedure Laterality Date  . EYE SURGERY Bilateral 2012   Family History  Problem Relation Age of Onset  . Diabetes Mother   .  Heart disease Mother   . Hyperlipidemia Mother   . Hypertension Mother   . Cancer Father        PROSTATE  . Diabetes Brother   . Diabetes Brother   . Cancer Brother        LUNG  . Cancer Brother        BONE   Social History   Tobacco Use  . Smoking status: Passive Smoke Exposure - Never Smoker  . Smokeless tobacco: Never Used  . Tobacco comment: Second hand smoke exposure  Substance Use Topics  . Alcohol use: No  . Drug use: No    ROS Constitutional: Denies fever, chills, weight loss/gain, headaches, insomnia,  night sweats, and change in appetite. Does c/o fatigue. Eyes: Denies redness, blurred vision, diplopia, discharge, itchy, watery eyes.  ENT: Denies discharge, congestion, post nasal drip, epistaxis, sore  throat, earache, hearing loss, dental pain, Tinnitus, Vertigo, Sinus pain, snoring.  Cardio: Denies chest pain, palpitations, irregular heartbeat, syncope, dyspnea, diaphoresis, orthopnea, PND, claudication, edema Respiratory: denies cough, dyspnea, DOE, pleurisy, hoarseness, laryngitis, wheezing.  Gastrointestinal: Denies dysphagia, heartburn, reflux, water brash, pain, cramps, nausea, vomiting, bloating, diarrhea, constipation, hematemesis, melena, hematochezia, jaundice, hemorrhoids Genitourinary: Denies dysuria, frequency, urgency, nocturia, hesitancy, discharge, hematuria, flank pain Breast: Breast lumps, nipple discharge, bleeding.  Musculoskeletal: Denies arthralgia, myalgia, stiffness, Jt. Swelling, pain, limp, and strain/sprain. Denies falls. Skin: Denies puritis, rash, hives, warts, acne, eczema, changing in skin lesion Neuro: No weakness, tremor, incoordination, spasms, paresthesia, pain Psychiatric: Denies confusion, memory loss, sensory loss. Denies Depression. Endocrine: Denies change in weight, skin, hair change, nocturia, and paresthesia, diabetic polys, visual blurring, hyper / hypo glycemic episodes.  Heme/Lymph: No excessive bleeding, bruising, enlarged lymph nodes.  Physical Exam  BP 136/72   Pulse 62   Temp (!) 97.2 F (36.2 C)   Resp 16   Ht 5' 3.5" (1.613 m)   Wt 126 lb 9.6 oz (57.4 kg)   SpO2 99%   BMI 22.07 kg/m   General Appearance: Well nourished, well groomed and in no apparent distress.  Eyes: PERRLA, EOMs, conjunctiva no swelling or erythema, normal fundi and vessels. Sinuses: No frontal/maxillary tenderness ENT/Mouth: EACs patent / TMs  nl. Nares clear without erythema, swelling, mucoid exudates. Oral hygiene is good. No erythema, swelling, or exudate. Tongue normal, non-obstructing. Tonsils not swollen or erythematous. Hearing normal.  Neck: Supple, thyroid not palpable. No bruits, nodes or JVD. Respiratory: Respiratory effort normal.  BS equal and  clear bilateral without rales, rhonci, wheezing or stridor. Cardio: Heart sounds are normal with regular rate and rhythm and no murmurs, rubs or gallops. Peripheral pulses are normal and equal bilaterally without edema. No aortic or femoral bruits. Chest: symmetric with normal excursions and percussion. Breasts: Symmetric, without lumps, nipple discharge, retractions, or fibrocystic changes.  Abdomen: Flat, soft with bowel sounds active. Nontender, no guarding, rebound, hernias, masses, or organomegaly.  Lymphatics: Non tender without lymphadenopathy.  Genitourinary:  Musculoskeletal: Full ROM all peripheral extremities, joint stability, 5/5 strength, and normal gait. Skin: Warm and dry without rashes, lesions, cyanosis, clubbing or  ecchymosis.  Neuro: Cranial nerves intact, reflexes equal bilaterally. Normal muscle tone, no cerebellar symptoms. Sensation intact.  Pysch: Alert and oriented X 3, normal affect, Insight and Judgment appropriate.   Assessment and Plan  1. Annual Preventative Screening Examination   2. Essential hypertension  - EKG 12-Lead - Urinalysis, Routine w reflex microscopic - Microalbumin / creatinine urine ratio - CBC with Differential/Platelet - COMPLETE METABOLIC PANEL WITH GFR -  Magnesium - TSH  3. Hyperlipidemia, mixed  - EKG 12-Lead - Lipid panel - TSH  4. Abnormal glucose  - EKG 12-Lead - Hemoglobin A1c - Insulin, random  5. Vitamin D deficiency  - VITAMIN D 25 Hydroxy  6. 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 Aortic atherosclerosis ([place]) by CT scan 06/04/2019  - EKG 12-Lead - Lipid panel  8. Screening for colorectal cancer  - POC Hemoccult Bld/Stl   9. Screening for ischemic heart disease  - EKG 12-Lead  10. FHx: heart disease  - EKG 12-Lead  11. Medication  management  - Urinalysis, Routine w reflex microscopic - Microalbumin / creatinine urine ratio - CBC with Differential/Platelet - COMPLETE METABOLIC PANEL WITH GFR - Magnesium - Lipid panel - TSH - Hemoglobin A1c - Insulin, random - VITAMIN D 25 Hydroxy  12. Stage 3b chronic kidney disease ([place])          Patient was counseled in prudent diet to achieve/maintain BMI less than 25 for weight control, BP monitoring, regular exercise and medications. Discussed med's effects and SE's. Screening labs and tests as requested with regular follow-up as recommended. Over 40 minutes of exam, counseling, chart review and high complex critical decision making was performed.   Kirtland Bouchard, MD

## 2020-01-29 LAB — LIPID PANEL
Cholesterol: 169 mg/dL (ref <200)
HDL: 47 mg/dL — ABNORMAL LOW (ref >=50)
LDL Cholesterol (Calc): 101 mg/dL (calc) — ABNORMAL HIGH
Non-HDL Cholesterol (Calc): 122 mg/dL (calc) (ref <130)
Total CHOL/HDL Ratio: 3.6 (calc) (ref <5.0)
Triglycerides: 118 mg/dL (ref <150)

## 2020-01-29 LAB — COMPLETE METABOLIC PANEL WITH GFR
AG Ratio: 1.7 (calc) (ref 1.0–2.5)
ALT: 15 U/L (ref 6–29)
AST: 24 U/L (ref 10–35)
Albumin: 4.2 g/dL (ref 3.6–5.1)
Alkaline phosphatase (APISO): 65 U/L (ref 37–153)
BUN/Creatinine Ratio: 19 (calc) (ref 6–22)
BUN: 29 mg/dL — ABNORMAL HIGH (ref 7–25)
CO2: 33 mmol/L — ABNORMAL HIGH (ref 20–32)
Calcium: 9.1 mg/dL (ref 8.6–10.4)
Chloride: 101 mmol/L (ref 98–110)
Creat: 1.55 mg/dL — ABNORMAL HIGH (ref 0.60–0.93)
GFR, Est African American: 37 mL/min/{1.73_m2} — ABNORMAL LOW (ref >=60)
GFR, Est Non African American: 32 mL/min/{1.73_m2} — ABNORMAL LOW (ref >=60)
Globulin: 2.5 g/dL (calc) (ref 1.9–3.7)
Glucose, Bld: 85 mg/dL (ref 65–99)
Potassium: 4.4 mmol/L (ref 3.5–5.3)
Sodium: 141 mmol/L (ref 135–146)
Total Bilirubin: 0.5 mg/dL (ref 0.2–1.2)
Total Protein: 6.7 g/dL (ref 6.1–8.1)

## 2020-01-29 LAB — HEMOGLOBIN A1C
Hgb A1c MFr Bld: 6 % of total Hgb — ABNORMAL HIGH (ref <5.7)
Mean Plasma Glucose: 126 mg/dL
eAG (mmol/L): 7 mmol/L

## 2020-01-29 LAB — CBC WITH DIFFERENTIAL/PLATELET
Absolute Monocytes: 432 cells/uL (ref 200–950)
Basophils Absolute: 11 cells/uL (ref 0–200)
Basophils Relative: 0.3 %
Eosinophils Absolute: 50 cells/uL (ref 15–500)
Eosinophils Relative: 1.4 %
HCT: 34.7 % — ABNORMAL LOW (ref 35.0–45.0)
Hemoglobin: 11.6 g/dL — ABNORMAL LOW (ref 11.7–15.5)
Lymphs Abs: 1523 cells/uL (ref 850–3900)
MCH: 29.8 pg (ref 27.0–33.0)
MCHC: 33.4 g/dL (ref 32.0–36.0)
MCV: 89.2 fL (ref 80.0–100.0)
MPV: 11.7 fL (ref 7.5–12.5)
Monocytes Relative: 12 %
Neutro Abs: 1584 cells/uL (ref 1500–7800)
Neutrophils Relative %: 44 %
Platelets: 175 10*3/uL (ref 140–400)
RBC: 3.89 10*6/uL (ref 3.80–5.10)
RDW: 12.6 % (ref 11.0–15.0)
Total Lymphocyte: 42.3 %
WBC: 3.6 10*3/uL — ABNORMAL LOW (ref 3.8–10.8)

## 2020-01-29 LAB — URINALYSIS, ROUTINE W REFLEX MICROSCOPIC
Bilirubin Urine: NEGATIVE
Glucose, UA: NEGATIVE
Hgb urine dipstick: NEGATIVE
Ketones, ur: NEGATIVE
Leukocytes,Ua: NEGATIVE
Nitrite: NEGATIVE
Protein, ur: NEGATIVE
Specific Gravity, Urine: 1.016 (ref 1.001–1.03)
pH: 7 (ref 5.0–8.0)

## 2020-01-29 LAB — MAGNESIUM: Magnesium: 2.3 mg/dL (ref 1.5–2.5)

## 2020-01-29 LAB — VITAMIN D 25 HYDROXY (VIT D DEFICIENCY, FRACTURES): Vit D, 25-Hydroxy: 71 ng/mL (ref 30–100)

## 2020-01-29 LAB — TSH: TSH: 0.57 mIU/L (ref 0.40–4.50)

## 2020-01-29 LAB — INSULIN, RANDOM: Insulin: 3.3 u[IU]/mL

## 2020-01-29 LAB — MICROALBUMIN / CREATININE URINE RATIO
Creatinine, Urine: 113 mg/dL (ref 20–275)
Microalb Creat Ratio: 4 mcg/mg creat (ref <30)
Microalb, Ur: 0.5 mg/dL

## 2020-01-31 ENCOUNTER — Encounter: Payer: Self-pay | Admitting: Internal Medicine

## 2020-01-31 NOTE — Progress Notes (Signed)
========================================================== ==========================================================  -    CBC shows mild chronic anemia which is stable ========================================================== ==========================================================  -  Kidney functions (GFR) are still [place] 3b & stable ========================================================== ==========================================================  -  Total Chol = 169    and LDL Chol Chol   = 101 - Both  Excellent   - Very low risk for Heart Attack  / Stroke ======================================================== ==========================================================  - A1c ( 12 week average blood sugar ) has gone up from 5.6% to now 6.0%     (Ideal or Goal is below 5.7%)   - So   - Avoid Sweets, Candy & White Stuff   - Rice, Potatoes, Breads &  Pasta ========================================================== ==========================================================  -  Vitamin D = 71  -  Excellent  ========================================================== ==========================================================  -  All Else - CBC - Kidneys - Electrolytes - Liver - Magnesium & Thyroid    - all  Normal / OK ===========================================================  ==========================================================  - Keep up the [place] Work  !  ========================================================== ==========================================================

## 2020-02-09 ENCOUNTER — Other Ambulatory Visit: Payer: Self-pay

## 2020-02-09 ENCOUNTER — Ambulatory Visit (INDEPENDENT_AMBULATORY_CARE_PROVIDER_SITE_OTHER): Payer: Medicare HMO | Admitting: Adult Health

## 2020-02-09 ENCOUNTER — Encounter: Payer: Self-pay | Admitting: Adult Health

## 2020-02-09 VITALS — BP 140/68 | HR 66 | Temp 97.3°F | Wt 128.0 lb

## 2020-02-09 DIAGNOSIS — B029 Zoster without complications: Secondary | ICD-10-CM

## 2020-02-09 MED ORDER — VALACYCLOVIR HCL 1 G PO TABS: 1000.0000 mg | ORAL_TABLET | Freq: Three times a day (TID) | ORAL | 0 refills | Status: DC

## 2020-02-09 NOTE — Patient Instructions (Signed)
Ask insurance about coverage with shingrix vaccine - need to wait at minimum 6 months after having shingles outbreak   Can try hydrocortisone cream for itching if needed     Valacyclovir caplets What is this medicine? VALACYCLOVIR (val ay SYE kloe veer) is an antiviral medicine. It is used to treat or prevent infections caused by certain kinds of viruses. Examples of these infections include herpes and shingles. This medicine will not cure herpes. This medicine may be used for other purposes; ask your health care provider or pharmacist if you have questions. COMMON BRAND NAME(S): Valtrex What should I tell my health care provider before I take this medicine? They need to know if you have any of these conditions:  acquired immunodeficiency syndrome (AIDS)  any other condition that may weaken the immune system  bone marrow or kidney transplant  kidney disease  an unusual or allergic reaction to valacyclovir, acyclovir, ganciclovir, valganciclovir, other medicines, foods, dyes, or preservatives  pregnant or trying to get pregnant  breast-feeding How should I use this medicine? Take this medicine by mouth with a glass of water. Follow the directions on the prescription label. You can take this medicine with or without food. Take your doses at regular intervals. Do not take your medicine more often than directed. Finish the full course prescribed by your doctor or health care professional even if you think your condition is better. Do not stop taking except on the advice of your doctor or health care professional. Talk to your pediatrician regarding the use of this medicine in children. While this drug may be prescribed for children as young as 2 years for selected conditions, precautions do apply. Overdosage: If you think you have taken too much of this medicine contact a poison control center or emergency room at once. NOTE: This medicine is only for you. Do not share this medicine with  others. What if I miss a dose? If you miss a dose, take it as soon as you can. If it is almost time for your next dose, take only that dose. Do not take double or extra doses. What may interact with this medicine? Do not take this medicine with any of the following medications:  cidofovir This medicine may also interact with the following medications:  adefovir  amphotericin B  certain antibiotics like amikacin, gentamicin, tobramycin, vancomycin  cimetidine  cisplatin  colistin  cyclosporine  foscarnet  lithium  methotrexate  probenecid  tacrolimus This list may not describe all possible interactions. Give your health care provider a list of all the medicines, herbs, non-prescription drugs, or dietary supplements you use. Also tell them if you smoke, drink alcohol, or use illegal drugs. Some items may interact with your medicine. What should I watch for while using this medicine? Tell your doctor or health care professional if your symptoms do not start to get better after 1 week. This medicine works best when taken early in the course of an infection, within the first 37 hours. Begin treatment as soon as possible after the first signs of infection like tingling, itching, or pain in the affected area. It is possible that genital herpes may still be spread even when you are not having symptoms. Always use safer sex practices like condoms made of latex or polyurethane whenever you have sexual contact. You should stay well hydrated while taking this medicine. Drink plenty of fluids. What side effects may I notice from receiving this medicine? Side effects that you should report to your doctor  or health care professional as soon as possible:  allergic reactions like skin rash, itching or hives, swelling of the face, lips, or tongue  aggressive behavior  confusion  hallucinations  problems with balance, talking, walking  stomach pain  tremor  trouble passing urine  or change in the amount of urine Side effects that usually do not require medical attention (report to your doctor or health care professional if they continue or are bothersome):  dizziness  headache  nausea, vomiting This list may not describe all possible side effects. Call your doctor for medical advice about side effects. You may report side effects to FDA at 1-800-FDA-1088. Where should I keep my medicine? Keep out of the reach of children. Store at room temperature between 15 and 25 degrees C (59 and 77 degrees F). Keep container tightly closed. Throw away any unused medicine after the expiration date. NOTE: This sheet is a summary. It may not cover all possible information. If you have questions about this medicine, talk to your doctor, pharmacist, or health care provider.  2021 Elsevier/Gold Standard (2018-01-28 12:22:33)       Zoster Vaccine, Recombinant injection What is this medicine? ZOSTER VACCINE (ZOS ter vak SEEN) is a vaccine used to reduce the risk of getting shingles. This vaccine is not used to treat shingles or nerve pain from shingles. This medicine may be used for other purposes; ask your health care provider or pharmacist if you have questions. COMMON BRAND NAME(S): [facility] What should I tell my health care provider before I take this medicine? They need to know if you have any of these conditions:  cancer  immune system problems  an unusual or allergic reaction to Zoster vaccine, other medications, foods, dyes, or preservatives  pregnant or trying to get pregnant  breast-feeding How should I use this medicine? This vaccine is injected into a muscle. It is given by a health care provider. A copy of Vaccine Information Statements will be given before each vaccination. Be sure to read this information carefully each time. This sheet may change often. Talk to your health care provider about the use of this vaccine in children. This vaccine is not  approved for use in children. Overdosage: If you think you have taken too much of this medicine contact a poison control center or emergency room at once. NOTE: This medicine is only for you. Do not share this medicine with others. What if I miss a dose? Keep appointments for follow-up (booster) doses. It is important not to miss your dose. Call your health care provider if you are unable to keep an appointment. What may interact with this medicine?  medicines that suppress your immune system  medicines to treat cancer  steroid medicines like prednisone or cortisone This list may not describe all possible interactions. Give your health care provider a list of all the medicines, herbs, non-prescription drugs, or dietary supplements you use. Also tell them if you smoke, drink alcohol, or use illegal drugs. Some items may interact with your medicine. What should I watch for while using this medicine? Visit your health care provider regularly. This vaccine, like all vaccines, may not fully protect everyone. What side effects may I notice from receiving this medicine? Side effects that you should report to your doctor or health care professional as soon as possible:  allergic reactions (skin rash, itching or hives; swelling of the face, lips, or tongue)  trouble breathing Side effects that usually do not require medical attention (report these  to your doctor or health care professional if they continue or are bothersome):  chills  headache  fever  nausea  pain, redness, or irritation at site where injected  tiredness  vomiting This list may not describe all possible side effects. Call your doctor for medical advice about side effects. You may report side effects to FDA at 1-800-FDA-1088. Where should I keep my medicine? This vaccine is only given by a health care provider. It will not be stored at home. NOTE: This sheet is a summary. It may not cover all possible information. If  you have questions about this medicine, talk to your doctor, pharmacist, or health care provider.  2021 Elsevier/Gold Standard (2019-02-06 16:23:07)

## 2020-02-09 NOTE — Progress Notes (Signed)
Assessment and Plan:  Monique Barton was seen today for herpes zoster.  Diagnoses and all orders for this visit:  Herpes zoster without complication Herpes Zoster- valtrex sent in; denies significant pain, will defer gabapentin The patient was informed that the vesicles can cause chicken pox in a person that is not immune, avoid pregnant individuals, will follow up PRN.  Discussed shingrix vaccine; will check with insurance; will need to wait minimum of 8 weeks prior to getting this Follow up if persistent or new concerns.  -     valACYclovir (VALTREX) 1000 MG tablet; Take 1 tablet (1,000 mg total) by mouth 3 (three) times daily. Take for 7 days  Further disposition pending results of labs. Discussed med's effects and SE's.   Over 15 minutes of exam, counseling, chart review, and critical decision making was performed.   Future Appointments  Date Time Provider [place]  05/12/2020 11:00 AM Liane Comber, NP [facility] None  08/16/2020  9:30 AM Unk Pinto, MD [facility] None  02/13/2021  2:00 PM Unk Pinto, MD [facility] None    ------------------------------------------------------------------------------------------------------------------   HPI BP 140/68   Pulse 66   Temp (!) 97.3 F (36.3 C)   Wt 128 lb (58.1 kg)   SpO2 99%   BMI 22.32 kg/m   80 y.o.female presents for evalauation of rash suspect for shingles.   Began with itching to R flank 6 days ago, then over the weekend broke in blistered rash around her chest, mostly itching, no significant burning. Has been applying moisturizing lotion and itch cream OTC which did seem helpful.   She had zostavax in 2011.    Past Medical History:  Diagnosis Date  . Hyperlipidemia   . Hypertension   . Other abnormal glucose   . Unspecified vitamin D deficiency   . Wrist fracture, right      Allergies  Allergen Reactions  . Prednisone Other (See Comments)    HIGH DOSE CAUSES INSOMNIA    Current Outpatient  Medications on File Prior to Visit  Medication Sig  . Ascorbic Acid (VITAMIN C) 500 MG CAPS Take 1 capsule by mouth. Not on a regular basis  . aspirin 81 MG tablet Take 81 mg by mouth daily.  . Cholecalciferol (VITAMIN D PO) Take 5,000 Units by mouth daily.  . hydrochlorothiazide (HYDRODIURIL) 25 MG tablet Take 1/2 to 1 tablet Daily for BP & Fluid / Swelling  . losartan (COZAAR) 50 MG tablet TAKE 1 TABLET DAILY FOR BLOOD PRESSURE  . Magnesium 400 MG TABS Take 1 tablet by mouth daily.  . Multiple Vitamin (MULTIVITAMIN) tablet Take 1 tablet by mouth daily.  Marland Kitchen OVER THE COUNTER MEDICATION Takes an OTC Iron tablet daily.  . pravastatin (PRAVACHOL) 40 MG tablet Take 1 tablet at Bedtime for Cholesterol  . sertraline (ZOLOFT) 50 MG tablet Take     1 tablet     Daily      for Mood  . zinc gluconate 50 MG tablet Take 50 mg by mouth daily.   No current facility-administered medications on file prior to visit.    ROS: all negative except above.   Physical Exam:  BP 140/68   Pulse 66   Temp (!) 97.3 F (36.3 C)   Wt 128 lb (58.1 kg)   SpO2 99%   BMI 22.32 kg/m   General Appearance: Well nourished, in no apparent distress. Eyes: conjunctiva no swelling or erythema Respiratory: Respiratory effort normal, BS equal bilaterally without rales, rhonchi, wheezing or stridor.  Cardio: RRR  with no MRGs. Brisk peripheral pulses without edema.  Abdomen: Soft, + BS.  Non tender Lymphatics: Non tender without lymphadenopathy.  Musculoskeletal: normal gait.  Skin: Warm, dry; she has vesicular lesions over erythematous base in linear pattern from R mid back extending around to lower chest, not crossing midline Neuro: Normal muscle tone Psych: Awake and oriented X 3, normal affect, Insight and Judgment appropriate.     Izora Ribas, NP 11:35 AM [name]

## 2020-02-22 ENCOUNTER — Other Ambulatory Visit: Payer: Self-pay | Admitting: Internal Medicine

## 2020-03-17 ENCOUNTER — Ambulatory Visit: Payer: Medicare HMO | Admitting: Adult Health

## 2020-03-21 ENCOUNTER — Other Ambulatory Visit: Payer: Self-pay | Admitting: Internal Medicine

## 2020-03-22 ENCOUNTER — Ambulatory Visit: Payer: Medicare HMO | Admitting: Adult Health

## 2020-03-29 ENCOUNTER — Other Ambulatory Visit: Payer: Self-pay | Admitting: Adult Health

## 2020-03-29 ENCOUNTER — Other Ambulatory Visit: Payer: Self-pay | Admitting: Internal Medicine

## 2020-03-29 MED ORDER — OLMESARTAN MEDOXOMIL 20 MG PO TABS: ORAL_TABLET | ORAL | 0 refills | Status: DC

## 2020-05-12 ENCOUNTER — Encounter: Payer: Self-pay | Admitting: Adult Health

## 2020-05-12 ENCOUNTER — Ambulatory Visit (INDEPENDENT_AMBULATORY_CARE_PROVIDER_SITE_OTHER): Payer: Medicare HMO | Admitting: Adult Health

## 2020-05-12 ENCOUNTER — Other Ambulatory Visit: Payer: Self-pay

## 2020-05-12 VITALS — BP 162/70 | HR 58 | Temp 97.3°F | Wt 127.0 lb

## 2020-05-12 DIAGNOSIS — F3342 Major depressive disorder, recurrent, in full remission: Secondary | ICD-10-CM

## 2020-05-12 DIAGNOSIS — Z1231 Encounter for screening mammogram for malignant neoplasm of breast: Secondary | ICD-10-CM

## 2020-05-12 DIAGNOSIS — Z6822 Body mass index (BMI) 22.0-22.9, adult: Secondary | ICD-10-CM

## 2020-05-12 DIAGNOSIS — E782 Mixed hyperlipidemia: Secondary | ICD-10-CM

## 2020-05-12 DIAGNOSIS — R7309 Other abnormal glucose: Secondary | ICD-10-CM

## 2020-05-12 DIAGNOSIS — N1832 Chronic kidney disease, stage 3b: Secondary | ICD-10-CM

## 2020-05-12 DIAGNOSIS — E559 Vitamin D deficiency, unspecified: Secondary | ICD-10-CM

## 2020-05-12 DIAGNOSIS — M85839 Other specified disorders of bone density and structure, unspecified forearm: Secondary | ICD-10-CM

## 2020-05-12 DIAGNOSIS — Z79899 Other long term (current) drug therapy: Secondary | ICD-10-CM

## 2020-05-12 DIAGNOSIS — R6889 Other general symptoms and signs: Secondary | ICD-10-CM

## 2020-05-12 DIAGNOSIS — Z0001 Encounter for general adult medical examination with abnormal findings: Secondary | ICD-10-CM

## 2020-05-12 DIAGNOSIS — Z0000 Encounter for general adult medical examination without abnormal findings: Secondary | ICD-10-CM

## 2020-05-12 DIAGNOSIS — I10 Essential (primary) hypertension: Secondary | ICD-10-CM

## 2020-05-12 DIAGNOSIS — I700 Atherosclerosis of aorta: Secondary | ICD-10-CM | POA: Diagnosis not present

## 2020-05-12 DIAGNOSIS — R69 Illness, unspecified: Secondary | ICD-10-CM | POA: Diagnosis not present

## 2020-05-12 NOTE — Patient Instructions (Addendum)
  Monique Barton , Thank you for taking time to come for your Medicare Wellness Visit. I appreciate your ongoing commitment to your health goals. Please review the following plan we discussed and let me know if I can assist you in the future.   These are the goals we discussed: Goals    . Blood Pressure < 130/80    . DIET - INCREASE WATER INTAKE     Work up to 4+ bottles daily     . Exercise 150 min/wk Moderate Activity       This is a list of the screening recommended for you and due dates:  Health Maintenance  Topic Date Due  . COVID-19 Vaccine (3 - Pfizer risk 4-dose series) 04/22/2019  .  Hepatitis C: One time screening is recommended by Center for Disease Control  (CDC) for  adults born from [birth year] through 1965.   05/12/2021*  . Flu Shot  08/15/2020  . Tetanus Vaccine  04/03/2021  . DEXA scan (bone density measurement)  Completed  . Pneumonia vaccines  Completed  . HPV Vaccine  Aged Out  *Topic was postponed. The date shown is not the original due date.      Try olmesartan 40 mg daily for 7 days Then try olmesartan 20 mg with 1/2 tab hydrochlorothiazide for 1 week  If either are <140/90 consistently - continue that   If not at goal, go back to olmesartan 20 mg and hydrochlorothiazide 25 mg daily    ABSOLUTELY need to increase fluid intake for kidneys     HOW TO SCHEDULE A MAMMOGRAM AND BONE DENSITY DUE AFTER 09/24/2020  [facility]  7 a.m.-6:30 p.m., Monday 7 a.m.-5 p.m., Tuesday-Friday Schedule an appointment by calling [phone].

## 2020-05-12 NOTE — Progress Notes (Signed)
MEDICARE ANNUAL WELLNESS VISIT AND FOLLOW UP  Assessment:   Diagnoses and all orders for this visit:  Encounter for Medicare annual wellness exam ophth follow up, dental follow up encouraged Schedule mammogram and DEXA 09/2020  Essential hypertension Very elevated, Has not been taking HCTZ Would prefer fewer pills  Try olmesartan 40 mg x 1 week, then try olmesartan 20 mg and HCTZ 12.5 mg x 1 week - if at goal continue, if not resume previous  Monitor blood pressure at home; call if consistently over 130/80 Continue DASH diet.   Reminder to go to the ER if any CP, SOB, nausea, dizziness, severe HA, changes vision/speech, left arm numbness and tingling and jaw pain.  Vitamin D deficiency Continue supplementation Check vitamin D level as needed  Recurrent major depressive disorder, in full remission (Monique Barton) Continue medications  Lifestyle discussed: diet/exerise, sleep hygiene, stress management, hydration  Other abnormal glucose/Prediabetes Recent A1Cs at goal Discussed diet/exercise, weight management  Defer A1C; check CMP  Medication management CBC, CMP/GFR, magnesium   Hyperlipidemia Continue medication Continue low cholesterol diet and exercise.  Check lipid panel.   BMI 22.0-22.9, adult Continue to recommend diet heavy in fruits and veggies and low in animal meats, cheeses, and dairy products, appropriate calorie intake Discuss exercise recommendations routinely Continue to monitor weight at each visit  CKD (chronic kidney disease) stage 3, GFR 30-59 ml/min (HCC) Increase fluids, avoid NSAIDS, monitor sugars, will monitor Refer to nephrology if GFR persists <30  Osteopenia -DEXA due 09/2020, get q2y, continue Vit D and Ca, weight bearing exercises   Over 40 minutes of exam, counseling, chart review and critical decision making was performed Future Appointments  Date Time Provider [place]  06/07/2020  8:45 AM [facility] NURSE [facility] None  08/16/2020   9:30 AM Unk Pinto, MD [facility] None  02/13/2021  2:00 PM Unk Pinto, MD [facility] None  05/12/2021 11:00 AM Liane Comber, NP [facility] None     Plan:   During the course of the visit the patient was educated and counseled about appropriate screening and preventive services including:    Pneumococcal vaccine   Prevnar 13  Influenza vaccine  Td vaccine  Screening electrocardiogram  Bone densitometry screening  Colorectal cancer screening  Diabetes screening  Glaucoma screening  Nutrition counseling   Advanced directives: requested   Subjective:  Monique Barton is a 80 y.o. female who presents for Medicare Annual Wellness Visit and 3 month follow up.   She has hx of depression and remains in remission on zoloft 50 mg daily.   BMI is Body mass index is 22.14 kg/m., she has been working on diet and exercise. She is doing line dancing and sometimes gardens.  Wt Readings from Last 3 Encounters:  05/12/20 127 lb (57.6 kg)  02/09/20 128 lb (58.1 kg)  01/28/20 126 lb 9.6 oz (57.4 kg)   She admits not checking BPs at home, stopped taking HCTZ 1 month ago, only taking olmesartan 20 mg, today their BP is BP: (!) 162/70, similar by manual recheck by provider.   She does workout. She denies chest pain, shortness of breath, dizziness.   She is on cholesterol medication and denies myalgias. Her cholesterol is at goal. The cholesterol last visit was:   Lab Results  Component Value Date   CHOL 169 01/28/2020   HDL 47 (L) 01/28/2020   LDLCALC 101 (H) 01/28/2020   TRIG 118 01/28/2020   CHOLHDL 3.6 01/28/2020    She has been working on diet  and exercise for glucose management (recent hx of prediabetes), and denies foot ulcerations, increased appetite, nausea, paresthesia of the feet, polydipsia, polyuria, visual disturbances, vomiting and weight loss.  Last A1C in the office was:  Lab Results  Component Value Date   HGBA1C 6.0 (H) 01/28/2020   She has CKD  3b associated with htn monitored at this office, on ARB  Last GFR:  Lab Results  Component Value Date   GFRNONAA 32 (L) 01/28/2020   GFRNONAA 29 (L) 10/01/2019   GFRNONAA 27 (L) 06/18/2019  She drinks ~2 bottles daily. Denies current or hx of NSAID use.   Patient is on Vitamin D supplement and near goal of 60-100:    Lab Results  Component Value Date   VD25OH 17 01/28/2020      Medication Review: Current Outpatient Medications on File Prior to Visit  Medication Sig Dispense Refill  . Ascorbic Acid (VITAMIN C) 500 MG CAPS Take 1 capsule by mouth. Not on a regular basis    . aspirin 81 MG tablet Take 81 mg by mouth daily.    . Cholecalciferol (VITAMIN D PO) Take 5,000 Units by mouth daily.    . Magnesium 400 MG TABS Take 1 tablet by mouth daily.    . Multiple Vitamin (MULTIVITAMIN) tablet Take 1 tablet by mouth daily.    Marland Kitchen olmesartan (BENICAR) 20 MG tablet Take  1 tablet  Daily for BP (Replaces Losartan) 90 tablet 0  . OVER THE COUNTER MEDICATION Takes an OTC Iron tablet daily.    . pravastatin (PRAVACHOL) 40 MG tablet TAKE 1 TABLET BY MOUTH AT BEDTIME FOR CHOLESTEROL 90 tablet 3  . sertraline (ZOLOFT) 50 MG tablet TAKE 1 TABLET BY MOUTH DAILY FOR MOOD 90 tablet 0  . zinc gluconate 50 MG tablet Take 50 mg by mouth daily.    . hydrochlorothiazide (HYDRODIURIL) 25 MG tablet Take 1/2 to 1 tablet Daily for BP & Fluid / Swelling (Patient not taking: Reported on 05/12/2020) 60 tablet 1   No current facility-administered medications on file prior to visit.    Allergies  Allergen Reactions  . Prednisone Other (See Comments)    HIGH DOSE CAUSES INSOMNIA    Current Problems (verified) Patient Active Problem List   Diagnosis Date Noted  . Aortic atherosclerosis ([place]) by CT scan 06/04/2019 06/04/2019  . Osteopenia 06/16/2018  . BMI 22.0-22.9, adult 06/05/2017  . CKD (chronic kidney disease) stage 3, GFR 30-59 ml/min (HCC) 06/05/2017  . Recurrent major depressive disorder, in full  remission ([place]) 04/17/2016  . Encounter for Medicare annual wellness exam 12/28/2014  . Medication management 10/06/2013  . Essential hypertension 11/20/2012  . Hyperlipidemia 11/20/2012  . Other abnormal glucose (prediabetes) 11/20/2012  . Vitamin D deficiency 11/20/2012    Screening Tests Immunization History  Administered Date(s) Administered  . Fluad Quad(high Dose 65+) 10/02/2018  . Influenza Nasal 11/06/2011  . Influenza, High Dose Seasonal PF 10/06/2013, 09/23/2014, 10/30/2017, 10/08/2019  . Influenza-Unspecified 10/24/2012, 10/15/2016  . PFIZER(Purple Top)SARS-COV-2 Vaccination 02/28/2019, 03/25/2019  . Pneumococcal Conjugate-13 06/08/2014  . Pneumococcal-Unspecified 02/07/2006  . Tdap 04/04/2011  . Zoster 01/15/2009    Preventative care: Last colonoscopy: 1999 declines Cologuard 07/2018 Mammogram 09/2018 DEXA 09/2018 osteopenia - T -2.0 PAP remote  Tdap 2013 Influenza 09/2019 Prenvar 13 2016 Pneumonia 2008 Zoster 2011, planning to get shingrix Covid 19 2/2, pfizer   Names of Other Physician/Practitioners you currently use: 1. [facility] here for primary care 2. Dr. Jefm Petty, eye doctor, [phone]  3. Not seeing one, dentist, last visit remote, needs to schedule   Patient Care Team: Unk Pinto, MD as PCP - General (Internal Medicine)  SURGICAL HISTORY She  has a past surgical history that includes Eye surgery (Bilateral, 2012). FAMILY HISTORY Her family history includes Cancer in her brother, brother, and father; Diabetes in her brother, brother, and mother; Heart disease in her mother; Hyperlipidemia in her mother; Hypertension in her mother. SOCIAL HISTORY She  reports that she is a non-smoker but has been exposed to tobacco smoke. She has never used smokeless tobacco. She reports that she does not drink alcohol and does not use drugs.   MEDICARE WELLNESS OBJECTIVES: Physical activity: Current Exercise Habits: The  patient does not participate in regular exercise at present (average 6 hours in yard every 10 days), Type of exercise: walking, Exercise limited by: None identified Cardiac risk factors: Cardiac Risk Factors include: advanced age (>92men, >90 women);dyslipidemia;hypertension Depression/mood screen:   Depression screen [facility] 2/9 05/12/2020  Decreased Interest 0  Down, Depressed, Hopeless 0  PHQ - 2 Score 0  Altered sleeping 0  Tired, decreased energy 0  Change in appetite 0  Feeling bad or failure about yourself  0  Trouble concentrating 0  Moving slowly or fidgety/restless 0  Suicidal thoughts 0  PHQ-9 Score 0  Difficult doing work/chores Not difficult at all    ADLs:  In your present state of health, do you have any difficulty performing the following activities: 05/12/2020 01/31/2020  Hearing? N N  Vision? N N  Difficulty concentrating or making decisions? N N  Walking or climbing stairs? N N  Dressing or bathing? N N  Doing errands, shopping? N N  Some recent data might be hidden     Cognitive Testing  Alert? Yes  Normal Appearance?Yes  Oriented to person? Yes  Place? Yes   Time? Yes  Recall of three objects?  Yes  Can perform simple calculations? Yes  Displays appropriate judgment?Yes  Can read the correct time from a watch face?Yes  EOL planning: Does Patient Have a Medical Advance Directive?: No Would patient like information on creating a medical advance directive?: No - Patient declined   Review of Systems  Constitutional: Negative for malaise/fatigue and weight loss.  HENT: Negative for hearing loss and tinnitus.   Eyes: Negative for blurred vision and double vision.  Respiratory: Negative for cough, sputum production, shortness of breath and wheezing.   Cardiovascular: Negative for chest pain, palpitations, orthopnea, claudication, leg swelling and PND.  Gastrointestinal: Negative for abdominal pain, blood in stool, constipation, diarrhea, heartburn, melena, nausea  and vomiting.  Genitourinary: Negative.   Musculoskeletal: Negative for falls, joint pain and myalgias.  Skin: Negative for rash.  Neurological: Negative for dizziness, tingling, sensory change, weakness and headaches.  Endo/Heme/Allergies: Negative for polydipsia.  Psychiatric/Behavioral: Negative.  Negative for depression, memory loss, substance abuse and suicidal ideas. The patient is not nervous/anxious and does not have insomnia.   All other systems reviewed and are negative.    Objective:     Today's Vitals   05/12/20 1100  BP: (!) 162/70  Pulse: (!) 58  Temp: (!) 97.3 F (36.3 C)  SpO2: 98%  Weight: 127 lb (57.6 kg)   Body mass index is 22.14 kg/m.  General appearance: alert, no distress, WD/WN, female HEENT: normocephalic, sclerae anicteric, TMs pearly, nares patent, no discharge or erythema, mask in place; oral exam deferred. Hearing normal.  Neck: supple, no lymphadenopathy, no thyromegaly, no masses Heart: RRR, normal  S1, S2, no murmurs Lungs: CTA bilaterally, no wheezes, rhonchi, or rales Abdomen: +bs, soft, non tender, non distended, no masses, no hepatomegaly, no splenomegaly Musculoskeletal: nontender, no swelling, no obvious deformity Extremities: no edema, no cyanosis, no clubbing Pulses: 2+ symmetric, upper and lower extremities, normal cap refill Neurological: alert, oriented x 3, CN2-12 intact, strength normal upper extremities and lower extremities, sensation normal throughout, DTRs 2+ throughout, no cerebellar signs, gait normal Psychiatric: normal affect, behavior normal, pleasant   Medicare Attestation I have personally reviewed: The patient's medical and social history Their use of alcohol, tobacco or illicit drugs Their current medications and supplements The patient's functional ability including ADLs,fall risks, home safety risks, cognitive, and hearing and visual impairment Diet and physical activities Evidence for depression or mood  disorders  The patient's weight, height, BMI, and visual acuity have been recorded in the chart.  I have made referrals, counseling, and provided education to the patient based on review of the above and I have provided the patient with a written personalized care plan for preventive services.     Izora Ribas, NP   05/12/2020

## 2020-05-13 ENCOUNTER — Other Ambulatory Visit: Payer: Self-pay

## 2020-05-13 ENCOUNTER — Other Ambulatory Visit: Payer: Self-pay | Admitting: Adult Health

## 2020-05-13 DIAGNOSIS — N289 Disorder of kidney and ureter, unspecified: Secondary | ICD-10-CM

## 2020-05-13 DIAGNOSIS — N1832 Chronic kidney disease, stage 3b: Secondary | ICD-10-CM

## 2020-05-13 LAB — COMPLETE METABOLIC PANEL WITH GFR
AG Ratio: 1.8 (calc) (ref 1.0–2.5)
ALT: 13 U/L (ref 6–29)
AST: 18 U/L (ref 10–35)
Albumin: 4.3 g/dL (ref 3.6–5.1)
Alkaline phosphatase (APISO): 69 U/L (ref 37–153)
BUN/Creatinine Ratio: 21 (calc) (ref 6–22)
BUN: 35 mg/dL — ABNORMAL HIGH (ref 7–25)
CO2: 31 mmol/L (ref 20–32)
Calcium: 9.6 mg/dL (ref 8.6–10.4)
Chloride: 103 mmol/L (ref 98–110)
Creat: 1.66 mg/dL — ABNORMAL HIGH (ref 0.60–0.93)
GFR, Est African American: 34 mL/min/{1.73_m2} — ABNORMAL LOW (ref >=60)
GFR, Est Non African American: 29 mL/min/{1.73_m2} — ABNORMAL LOW (ref >=60)
Globulin: 2.4 g/dL (calc) (ref 1.9–3.7)
Glucose, Bld: 90 mg/dL (ref 65–99)
Potassium: 4.7 mmol/L (ref 3.5–5.3)
Sodium: 140 mmol/L (ref 135–146)
Total Bilirubin: 0.4 mg/dL (ref 0.2–1.2)
Total Protein: 6.7 g/dL (ref 6.1–8.1)

## 2020-05-13 LAB — CBC WITH DIFFERENTIAL/PLATELET
Absolute Monocytes: 403 cells/uL (ref 200–950)
Basophils Absolute: 41 cells/uL (ref 0–200)
Basophils Relative: 0.8 %
Eosinophils Absolute: 311 cells/uL (ref 15–500)
Eosinophils Relative: 6.1 %
HCT: 36 % (ref 35.0–45.0)
Hemoglobin: 11.7 g/dL (ref 11.7–15.5)
Lymphs Abs: 1392 cells/uL (ref 850–3900)
MCH: 29.9 pg (ref 27.0–33.0)
MCHC: 32.5 g/dL (ref 32.0–36.0)
MCV: 92.1 fL (ref 80.0–100.0)
MPV: 11.4 fL (ref 7.5–12.5)
Monocytes Relative: 7.9 %
Neutro Abs: 2953 cells/uL (ref 1500–7800)
Neutrophils Relative %: 57.9 %
Platelets: 205 10*3/uL (ref 140–400)
RBC: 3.91 10*6/uL (ref 3.80–5.10)
RDW: 13 % (ref 11.0–15.0)
Total Lymphocyte: 27.3 %
WBC: 5.1 10*3/uL (ref 3.8–10.8)

## 2020-05-13 LAB — MAGNESIUM: Magnesium: 2.3 mg/dL (ref 1.5–2.5)

## 2020-05-13 LAB — TSH: TSH: 0.82 mIU/L (ref 0.40–4.50)

## 2020-05-13 LAB — LIPID PANEL
Cholesterol: 186 mg/dL (ref <200)
HDL: 62 mg/dL (ref >=50)
LDL Cholesterol (Calc): 105 mg/dL (calc) — ABNORMAL HIGH
Non-HDL Cholesterol (Calc): 124 mg/dL (calc) (ref <130)
Total CHOL/HDL Ratio: 3 (calc) (ref <5.0)
Triglycerides: 101 mg/dL (ref <150)

## 2020-05-13 MED ORDER — OLMESARTAN MEDOXOMIL 20 MG PO TABS: ORAL_TABLET | ORAL | 0 refills | Status: DC

## 2020-05-29 IMAGING — CR DG CHEST 2V
2 series · 2 of 2 positions shown · non-contrast
Comparison: April 24, 2019.

CLINICAL DATA: Shortness of breath, pneumonia.

EXAM:
CHEST - 2 VIEW

[w chest pa]
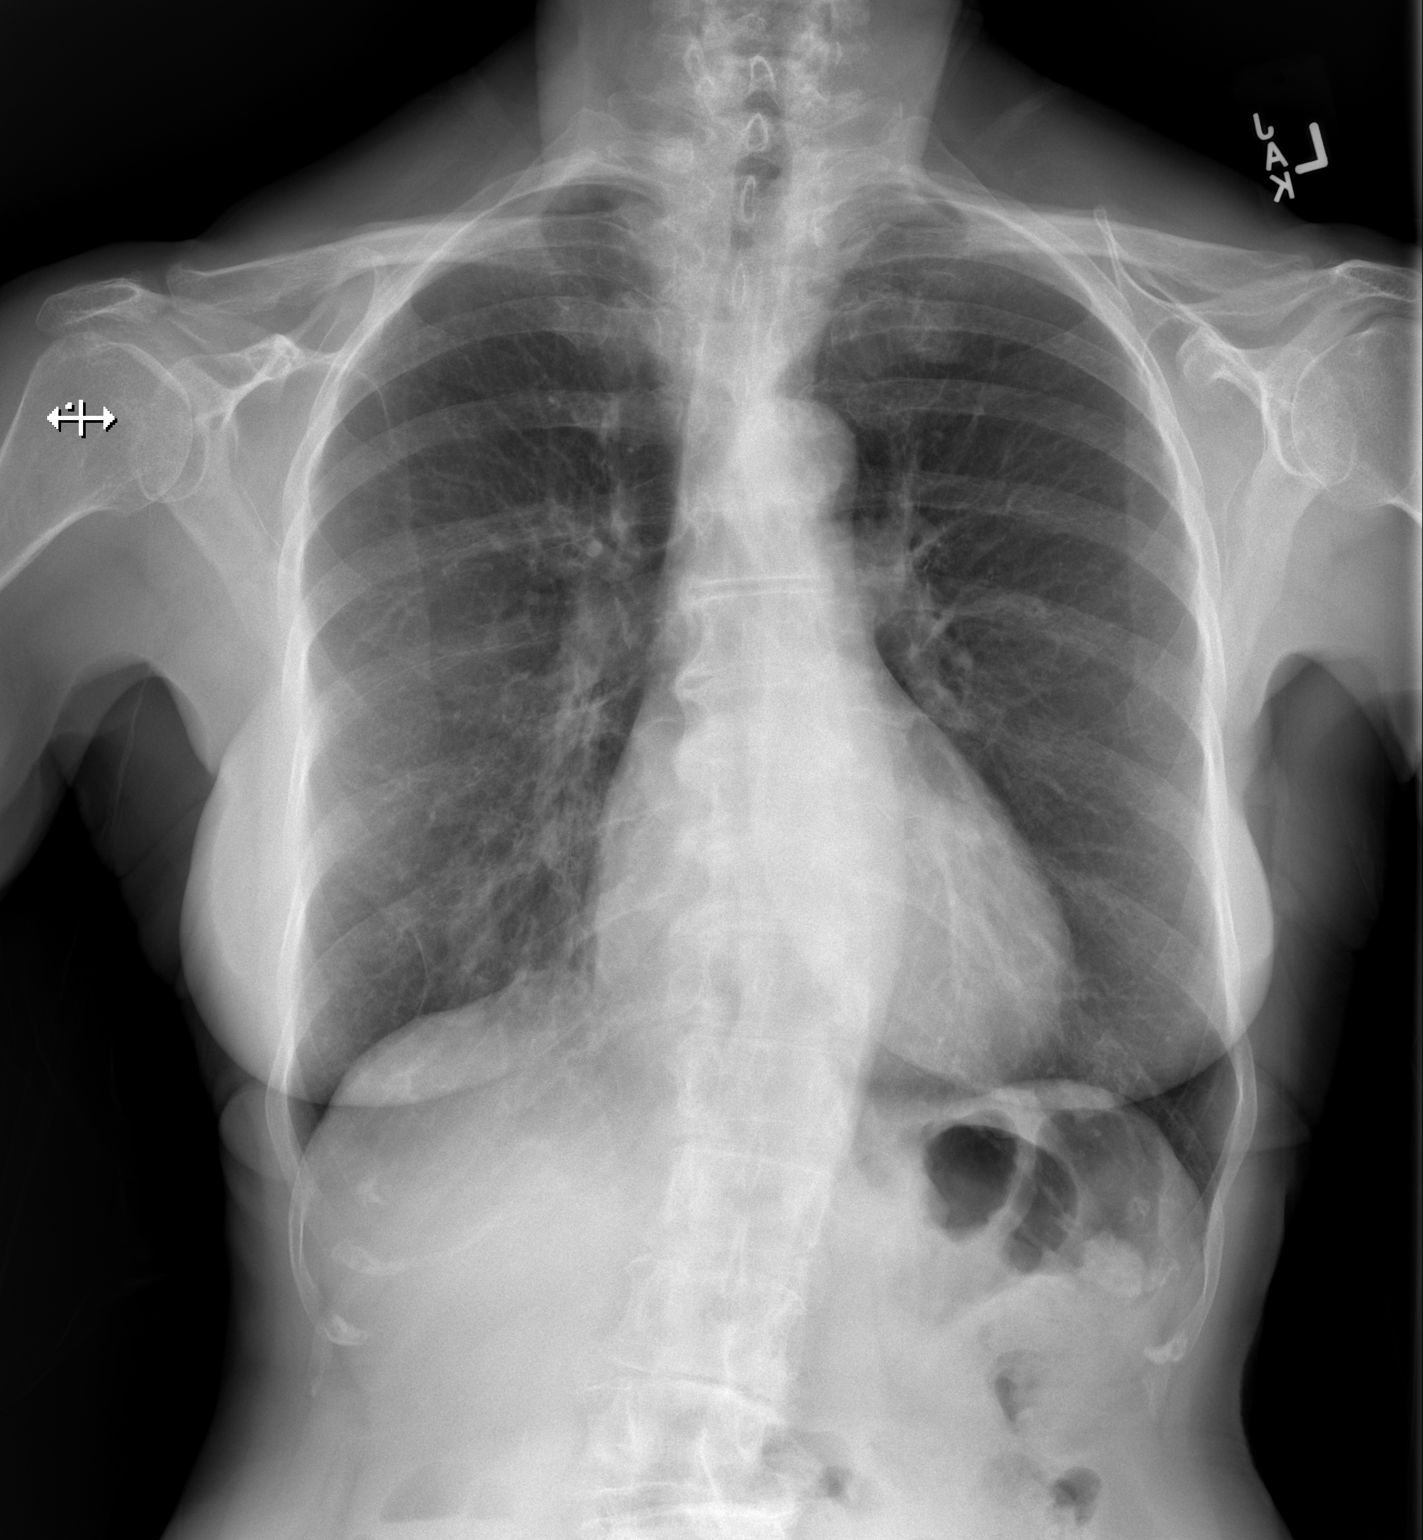

[w chest lat]
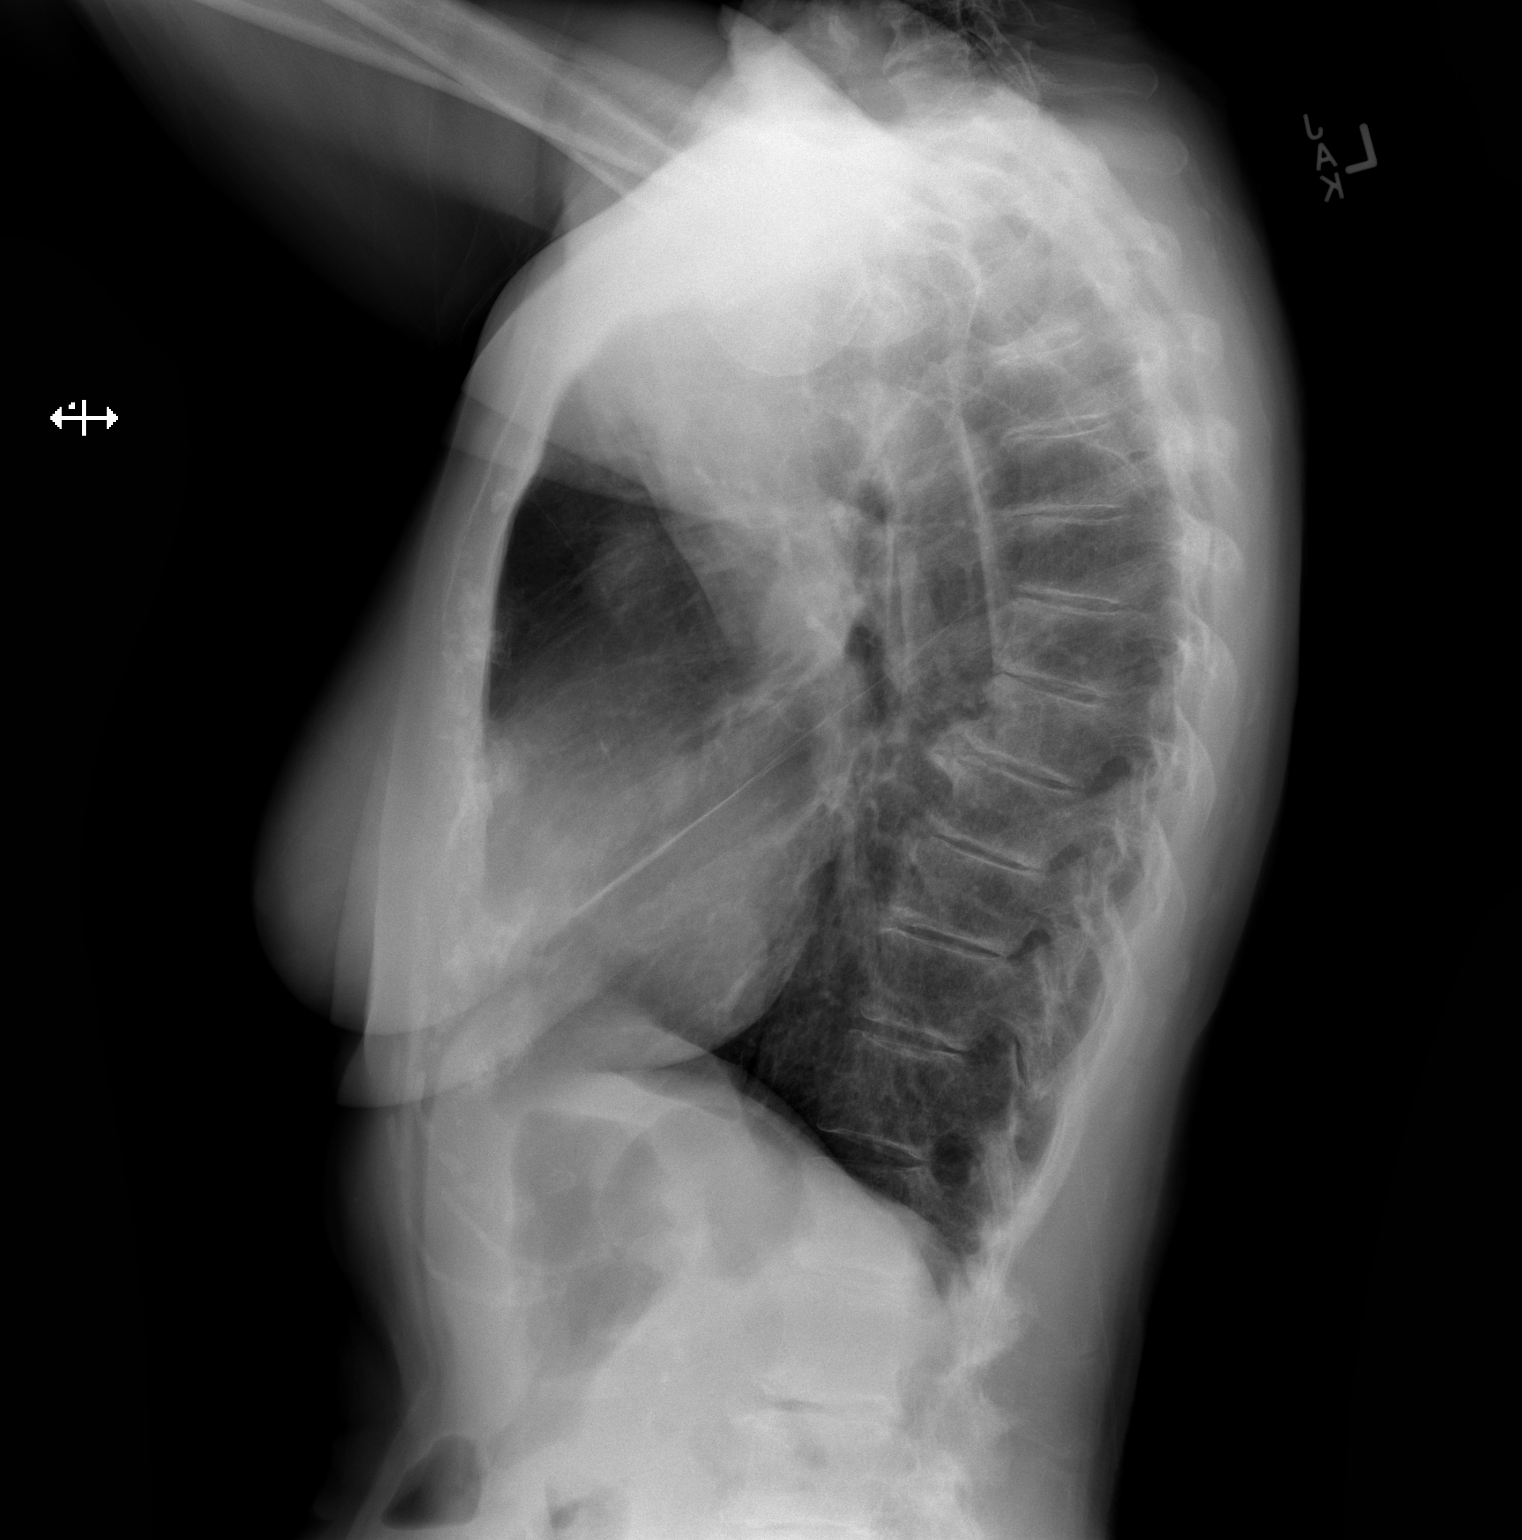

[2 of 2 positions shown; findings below may reference images not displayed]

FINDINGS: The heart size and mediastinal contours are within normal limits. No
pneumothorax or pleural effusion is noted. Left lung is clear. Right
basilar opacity noted on prior exam is significantly decreased
compared to prior exam suggesting improving atelectasis or
pneumonia. However, faint nodular density is seen in the right lung
base and CT scan of the chest is recommended for further evaluation.
The visualized skeletal structures are unremarkable.
IMPRESSION: Significantly decreased right basilar opacity is noted consistent
with improving atelectasis or pneumonia. However, faint nodular
density is seen in right lung base and CT scan of the chest is
recommended for further evaluation.

## 2020-06-07 ENCOUNTER — Ambulatory Visit (INDEPENDENT_AMBULATORY_CARE_PROVIDER_SITE_OTHER): Payer: Medicare HMO | Admitting: *Deleted

## 2020-06-07 ENCOUNTER — Other Ambulatory Visit: Payer: Self-pay

## 2020-06-07 ENCOUNTER — Other Ambulatory Visit: Payer: Self-pay | Admitting: *Deleted

## 2020-06-07 VITALS — BP 136/60 | HR 64 | Temp 97.5°F | Resp 16 | Ht 63.5 in | Wt 127.8 lb

## 2020-06-07 DIAGNOSIS — I10 Essential (primary) hypertension: Secondary | ICD-10-CM | POA: Diagnosis not present

## 2020-06-07 MED ORDER — OLMESARTAN MEDOXOMIL-HCTZ 20-12.5 MG PO TABS: 1.0000 | ORAL_TABLET | Freq: Every day | ORAL | 0 refills | Status: DC

## 2020-06-07 NOTE — Progress Notes (Signed)
Patient here for NV to recheck blood pressure. Patient took Olmesartan 40 mg x 1 week and then changed to Olmesartan 20 mg along with HCTZ 25 mg 0.5 tablet daily. BP at home ranged from 146/62 to 137/60. Today's BP is 136/60. Per Liane Comber, NP, continue the Olmesartan 20 mg daily along with HCTZ 12.5 mg and continue to monitor BP.

## 2020-06-17 ENCOUNTER — Other Ambulatory Visit: Payer: Self-pay | Admitting: Adult Health Nurse Practitioner

## 2020-08-11 DIAGNOSIS — M1990 Unspecified osteoarthritis, unspecified site: Secondary | ICD-10-CM | POA: Diagnosis not present

## 2020-08-11 DIAGNOSIS — R32 Unspecified urinary incontinence: Secondary | ICD-10-CM | POA: Diagnosis not present

## 2020-08-11 DIAGNOSIS — E785 Hyperlipidemia, unspecified: Secondary | ICD-10-CM | POA: Diagnosis not present

## 2020-08-11 DIAGNOSIS — Z818 Family history of other mental and behavioral disorders: Secondary | ICD-10-CM | POA: Diagnosis not present

## 2020-08-11 DIAGNOSIS — R609 Edema, unspecified: Secondary | ICD-10-CM | POA: Diagnosis not present

## 2020-08-11 DIAGNOSIS — Z7982 Long term (current) use of aspirin: Secondary | ICD-10-CM | POA: Diagnosis not present

## 2020-08-11 DIAGNOSIS — I10 Essential (primary) hypertension: Secondary | ICD-10-CM | POA: Diagnosis not present

## 2020-08-11 DIAGNOSIS — R69 Illness, unspecified: Secondary | ICD-10-CM | POA: Diagnosis not present

## 2020-08-11 DIAGNOSIS — Z8249 Family history of ischemic heart disease and other diseases of the circulatory system: Secondary | ICD-10-CM | POA: Diagnosis not present

## 2020-08-11 DIAGNOSIS — F419 Anxiety disorder, unspecified: Secondary | ICD-10-CM | POA: Diagnosis not present

## 2020-08-11 DIAGNOSIS — Z7722 Contact with and (suspected) exposure to environmental tobacco smoke (acute) (chronic): Secondary | ICD-10-CM | POA: Diagnosis not present

## 2020-08-11 DIAGNOSIS — I951 Orthostatic hypotension: Secondary | ICD-10-CM | POA: Diagnosis not present

## 2020-08-15 NOTE — Patient Instructions (Signed)

## 2020-08-15 NOTE — Progress Notes (Signed)
Future Appointments  Date Time Provider [place]  08/16/2020  9:30 AM Unk Pinto, MD [facility] None  02/13/2021  -  CPE  2:00 PM Unk Pinto, MD [facility] None  05/12/2021  -  Wellness 11:00 AM Liane Comber, NP [facility] None    History of Present Illness:       This very nice 80 y.o. WWF presents for  follow up with HTN, HLD, Pre-Diabetes and Vitamin D Deficiency. CT Scan in 2021 showed Aortic Atherosclerosis.       Patient is treated for HTN  (2005) /HTCVD with UMP5T & BP has been controlled at home. Today's BP is at goal - 140/70. Patient has had no complaints of any cardiac type chest pain, palpitations, dyspnea / orthopnea / PND, dizziness, claudication, or dependent edema.       Hyperlipidemia is near controlled with diet & Pravastatin. Patient denies myalgias or other med SE's. Last Lipids were near goal:  Lab Results  Component Value Date   CHOL 186 05/12/2020   HDL 62 05/12/2020   LDLCALC 105 (H) 05/12/2020   TRIG 101 05/12/2020   CHOLHDL 3.0 05/12/2020     Also, the patient has history of PreDiabetes   (A1c 5.8% /2013 &  6.0% /2016) and has had no symptoms of reactive hypoglycemia, diabetic polys, paresthesias or visual blurring.  Last A1c was not at goal:  Lab Results  Component Value Date   HGBA1C 6.0 (H) 01/28/2020        Further, the patient also has history of Vitamin D Deficiency and supplements vitamin D without any suspected side-effects. Last vitamin D was at gpoal:   Lab Results  Component Value Date   VD25OH 71 01/28/2020     Current Outpatient Medications on File Prior to Visit  Medication Sig   VITAMIN C 500 MG CAPS Not on a regular basis   aspirin 81 MG tablet Take daily.   VITAMIN D 5,000 Units  Take daily.   Magnesium 400 MG TABS Take 1 tablet daily.   Multiple Vitamin  Take 1 tablet daily.   olmesartan-hctz 20-12.5 MG tablet Take 1 tablet daily.   OTC Iron tab  Takes daily.   Pravastatin 4 0 MG tablet  TAKE 1 TABLET  AT BEDTIME    sertraline  50 MG tablet TAKE 1 TABLET  DAILY    zinc 50 MG tablet Take daily.    Allergies  Allergen Reactions   Prednisone Other (See Comments)    HIGH DOSE CAUSES INSOMNIA    PMHx:   Past Medical History:  Diagnosis Date   Hyperlipidemia    Hypertension    Other abnormal glucose    vitamin D deficiency    Wrist fracture, right      Immunization History  Administered Date(s) Administered   Fluad Quad(high Dose 65+) 10/02/2018   Influenza Nasal 11/06/2011   Influenza, High Dose  10/30/2017, 10/08/2019   Influenza 10/24/2012, 10/15/2016   PFIZER SARS -COV-2 Vacc 02/28/2019, 03/25/2019   Pneumococcal -13 06/08/2014   Pneumococcal-23 02/07/2006   Tdap 04/04/2011   Zoster, Live 01/15/2009     Past Surgical History:  Procedure Laterality Date   EYE SURGERY Bilateral 2012    FHx:    Reviewed / unchanged  SHx:    Reviewed / unchanged   Systems Review:  Constitutional: Denies fever, chills, wt changes, headaches, insomnia, fatigue, night sweats, change in appetite. Eyes: Denies redness, blurred vision, diplopia, discharge, itchy, watery eyes.  ENT: Denies discharge, congestion, post nasal drip, epistaxis, sore throat, earache, hearing loss, dental pain, tinnitus, vertigo, sinus pain, snoring.  CV: Denies chest pain, palpitations, irregular heartbeat, syncope, dyspnea, diaphoresis, orthopnea, PND, claudication or edema. Respiratory: denies cough, dyspnea, DOE, pleurisy, hoarseness, laryngitis, wheezing.  Gastrointestinal: Denies dysphagia, odynophagia, heartburn, reflux, water brash, abdominal pain or cramps, nausea, vomiting, bloating, diarrhea, constipation, hematemesis, melena, hematochezia  or hemorrhoids. Genitourinary: Denies dysuria, frequency, urgency, nocturia, hesitancy, discharge, hematuria or flank pain. Musculoskeletal: Denies arthralgias, myalgias, stiffness, jt. swelling, pain, limping or strain/sprain.  Skin: Denies pruritus,  rash, hives, warts, acne, eczema or change in skin lesion(s). Neuro: No weakness, tremor, incoordination, spasms, paresthesia or pain. Psychiatric: Denies confusion, memory loss or sensory loss. Endo: Denies change in weight, skin or hair change.  Heme/Lymph: No excessive bleeding, bruising or enlarged lymph nodes.  Physical Exam  BP 140/70   Pulse 71   Temp 97.6 F (36.4 C)   Resp 16   Ht 5' 3.5" (1.613 m)   Wt 127 lb (57.6 kg)   SpO2 98%   BMI 22.14 kg/m   Appears  well nourished, well groomed  and in no distress.  Eyes: PERRLA, EOMs, conjunctiva no swelling or erythema. Sinuses: No frontal/maxillary tenderness ENT/Mouth: EAC's clear, TM's nl w/o erythema, bulging. Nares clear w/o erythema, swelling, exudates. Oropharynx clear without erythema or exudates. Oral hygiene is good. Tongue normal, non obstructing. Hearing intact.  Neck: Supple. Thyroid not palpable. Car 2+/2+ without bruits, nodes or JVD. Chest: Respirations nl with BS clear & equal w/o rales, rhonchi, wheezing or stridor.  Cor: Heart sounds normal w/ regular rate and rhythm without sig. murmurs, gallops, clicks or rubs. Peripheral pulses normal and equal  without edema.  Abdomen: Soft & bowel sounds normal. Non-tender w/o guarding, rebound, hernias, masses or organomegaly.  Lymphatics: Unremarkable.  Musculoskeletal: Full ROM all peripheral extremities, joint stability, 5/5 strength and normal gait.  Skin: Warm, dry without exposed rashes, lesions or ecchymosis apparent.  Neuro: Cranial nerves intact, reflexes equal bilaterally. Sensory-motor testing grossly intact. Tendon reflexes grossly intact.  Pysch: Alert & oriented x 3.  Insight and judgement nl & appropriate. No ideations.  Assessment and Plan:  1. Essential hypertension  - Continue medication, monitor blood pressure at home.  - Continue DASH diet.  Reminder to go to the ER if any CP,  SOB, nausea, dizziness, severe HA, changes vision/speech.   - CBC  with Differential/Platelet - COMPLETE METABOLIC PANEL WITH GFR - Magnesium - TSH  2. Hyperlipidemia, mixed  - Continue diet/meds, exercise,& lifestyle modifications.  - Continue monitor periodic cholesterol/liver & renal functions    - Lipid panel - TSH  3. Abnormal glucose  - Continue diet, exercise  - Lifestyle modifications.  - Monitor appropriate labs   - Hemoglobin A1c - Insulin, random  4. Vitamin D deficiency  - Continue supplementation.   - VITAMIN D 25 Hydroxy   5. Stage 3b chronic kidney disease (HCC)  - COMPLETE METABOLIC PANEL WITH GFR  6. Aortic atherosclerosis ([place]) by CT scan 06/04/2019  - Lipid panel  7. Medication management  - CBC with Differential/Platelet - COMPLETE METABOLIC PANEL WITH GFR - Magnesium - Lipid panel - TSH - Hemoglobin A1c - Insulin, random - VITAMIN D 25 Hydroxy          Discussed  regular exercise, BP monitoring, weight control to achieve/maintain BMI less than 25 and discussed med and SE's. Recommended labs to assess and monitor clinical status with further disposition pending results  of labs.  I discussed the assessment and treatment plan with the patient. The patient was provided an opportunity to ask questions and all were answered. The patient agreed with the plan and demonstrated an understanding of the instructions.  I provided over 30 minutes of exam, counseling, chart review and  complex critical decision making.        The patient was advised to call back or seek an in-person evaluation if the symptoms worsen or if the condition fails to improve as anticipated.   Kirtland Bouchard, MD

## 2020-08-16 ENCOUNTER — Ambulatory Visit (INDEPENDENT_AMBULATORY_CARE_PROVIDER_SITE_OTHER): Payer: Medicare HMO | Admitting: Internal Medicine

## 2020-08-16 ENCOUNTER — Other Ambulatory Visit: Payer: Self-pay

## 2020-08-16 ENCOUNTER — Encounter: Payer: Self-pay | Admitting: Internal Medicine

## 2020-08-16 VITALS — BP 140/70 | HR 71 | Temp 97.6°F | Resp 16 | Ht 63.5 in | Wt 127.0 lb

## 2020-08-16 DIAGNOSIS — E559 Vitamin D deficiency, unspecified: Secondary | ICD-10-CM

## 2020-08-16 DIAGNOSIS — I700 Atherosclerosis of aorta: Secondary | ICD-10-CM | POA: Diagnosis not present

## 2020-08-16 DIAGNOSIS — Z79899 Other long term (current) drug therapy: Secondary | ICD-10-CM | POA: Diagnosis not present

## 2020-08-16 DIAGNOSIS — N1832 Chronic kidney disease, stage 3b: Secondary | ICD-10-CM

## 2020-08-16 DIAGNOSIS — R7309 Other abnormal glucose: Secondary | ICD-10-CM

## 2020-08-16 DIAGNOSIS — E782 Mixed hyperlipidemia: Secondary | ICD-10-CM

## 2020-08-16 DIAGNOSIS — I10 Essential (primary) hypertension: Secondary | ICD-10-CM | POA: Diagnosis not present

## 2020-08-17 LAB — COMPLETE METABOLIC PANEL WITH GFR
AG Ratio: 1.8 (calc) (ref 1.0–2.5)
ALT: 13 U/L (ref 6–29)
AST: 20 U/L (ref 10–35)
Albumin: 4.4 g/dL (ref 3.6–5.1)
Alkaline phosphatase (APISO): 70 U/L (ref 37–153)
BUN/Creatinine Ratio: 19 (calc) (ref 6–22)
BUN: 41 mg/dL — ABNORMAL HIGH (ref 7–25)
CO2: 31 mmol/L (ref 20–32)
Calcium: 9.7 mg/dL (ref 8.6–10.4)
Chloride: 101 mmol/L (ref 98–110)
Creat: 2.14 mg/dL — ABNORMAL HIGH (ref 0.60–0.95)
Globulin: 2.4 g/dL (calc) (ref 1.9–3.7)
Glucose, Bld: 86 mg/dL (ref 65–99)
Potassium: 4.6 mmol/L (ref 3.5–5.3)
Sodium: 140 mmol/L (ref 135–146)
Total Bilirubin: 0.4 mg/dL (ref 0.2–1.2)
Total Protein: 6.8 g/dL (ref 6.1–8.1)
eGFR: 23 mL/min/{1.73_m2} — ABNORMAL LOW (ref >=60)

## 2020-08-17 LAB — CBC WITH DIFFERENTIAL/PLATELET
Absolute Monocytes: 486 cells/uL (ref 200–950)
Basophils Absolute: 30 cells/uL (ref 0–200)
Basophils Relative: 0.5 %
Eosinophils Absolute: 354 cells/uL (ref 15–500)
Eosinophils Relative: 5.9 %
HCT: 36.6 % (ref 35.0–45.0)
Hemoglobin: 11.9 g/dL (ref 11.7–15.5)
Lymphs Abs: 1854 cells/uL (ref 850–3900)
MCH: 30 pg (ref 27.0–33.0)
MCHC: 32.5 g/dL (ref 32.0–36.0)
MCV: 92.2 fL (ref 80.0–100.0)
MPV: 11.3 fL (ref 7.5–12.5)
Monocytes Relative: 8.1 %
Neutro Abs: 3276 cells/uL (ref 1500–7800)
Neutrophils Relative %: 54.6 %
Platelets: 211 10*3/uL (ref 140–400)
RBC: 3.97 10*6/uL (ref 3.80–5.10)
RDW: 13.3 % (ref 11.0–15.0)
Total Lymphocyte: 30.9 %
WBC: 6 10*3/uL (ref 3.8–10.8)

## 2020-08-17 LAB — MAGNESIUM: Magnesium: 2.5 mg/dL (ref 1.5–2.5)

## 2020-08-17 LAB — TSH: TSH: 0.91 mIU/L (ref 0.40–4.50)

## 2020-08-17 LAB — HEMOGLOBIN A1C
Hgb A1c MFr Bld: 5.8 % of total Hgb — ABNORMAL HIGH (ref <5.7)
Mean Plasma Glucose: 120 mg/dL
eAG (mmol/L): 6.6 mmol/L

## 2020-08-17 LAB — VITAMIN D 25 HYDROXY (VIT D DEFICIENCY, FRACTURES): Vit D, 25-Hydroxy: 56 ng/mL (ref 30–100)

## 2020-08-17 LAB — LIPID PANEL
Cholesterol: 196 mg/dL (ref <200)
HDL: 67 mg/dL (ref >=50)
LDL Cholesterol (Calc): 107 mg/dL (calc) — ABNORMAL HIGH
Non-HDL Cholesterol (Calc): 129 mg/dL (calc) (ref <130)
Total CHOL/HDL Ratio: 2.9 (calc) (ref <5.0)
Triglycerides: 123 mg/dL (ref <150)

## 2020-08-17 LAB — INSULIN, RANDOM: Insulin: 4.4 u[IU]/mL

## 2020-08-17 NOTE — Progress Notes (Signed)
============================================================ ============================================================  -   Kidney functions look a little dehydrated    Very important to drink adequate amounts of fluids to prevent permanent damage    - Recommend drink at least 6 bottles (16 ounces) of fluids /water /day = 96 Oz ~100 oz  - 100 oz = 3,000 cc or 3 liters / day  - >> That's 1 &1/2 bottles of a 2 liter soda bottle /day !  ============================================================ ============================================================  -  Total Chol = 196    -    elevated            (  Ideal or Goal is less than 180  !  )   - and Bad /Dangerous LDL Chol = 107    -      elevated also        (  Ideal or Goal is less than 70  !  )   - So - Recommend a stricter low cholesterol diet   - Cholesterol only comes from animal sources  - ie. meat, dairy, egg yolks  - Eat all the vegetables you want.  - Avoid meat, especially red meat - Beef AND Pork .  - Avoid cheese & dairy - milk & ice cream.     - Cheese is the most concentrated form of trans-fats which  is the worst thing to clog up our arteries.   - Veggie cheese is OK which can be found in the fresh  produce section at [employer] or [employer] or [employer] ============================================================ ============================================================  -  A1c = 5.8% - better - down from 6.0% and getting close and                                                                       back to Normal nonDiabetic range ============================================================ ============================================================  -  Vitamin D = 56 - OK  ============================================================ ============================================================  -  All Else - CBC - Kidneys - Electrolytes - Liver - Magnesium & Thyroid    - all  Normal /  OK ============================================================ ============================================================

## 2020-08-21 ENCOUNTER — Encounter: Payer: Self-pay | Admitting: Internal Medicine

## 2020-09-01 ENCOUNTER — Other Ambulatory Visit: Payer: Self-pay | Admitting: Adult Health

## 2020-09-01 ENCOUNTER — Telehealth: Payer: Self-pay | Admitting: Adult Health

## 2020-09-01 NOTE — Telephone Encounter (Signed)
(  g) Benicar declined at [facility]. please advise patient

## 2020-09-05 ENCOUNTER — Other Ambulatory Visit: Payer: Self-pay | Admitting: Adult Health

## 2020-09-05 MED ORDER — OLMESARTAN MEDOXOMIL-HCTZ 20-12.5 MG PO TABS: 1.0000 | ORAL_TABLET | Freq: Every day | ORAL | 3 refills | Status: DC

## 2020-09-13 DIAGNOSIS — H0102B Squamous blepharitis left eye, upper and lower eyelids: Secondary | ICD-10-CM | POA: Diagnosis not present

## 2020-09-13 DIAGNOSIS — H16223 Keratoconjunctivitis sicca, not specified as Sjogren's, bilateral: Secondary | ICD-10-CM | POA: Diagnosis not present

## 2020-09-13 DIAGNOSIS — H1045 Other chronic allergic conjunctivitis: Secondary | ICD-10-CM | POA: Diagnosis not present

## 2020-09-13 DIAGNOSIS — H0102A Squamous blepharitis right eye, upper and lower eyelids: Secondary | ICD-10-CM | POA: Diagnosis not present

## 2020-12-02 ENCOUNTER — Ambulatory Visit
Admission: EM | Admit: 2020-12-02 | Discharge: 2020-12-02 | Disposition: A | Discharge status: Home / Self Care | Payer: Medicare HMO | Attending: Physician Assistant | Admitting: Physician Assistant

## 2020-12-02 ENCOUNTER — Ambulatory Visit (INDEPENDENT_AMBULATORY_CARE_PROVIDER_SITE_OTHER): Payer: Medicare HMO

## 2020-12-02 ENCOUNTER — Encounter: Payer: Self-pay | Admitting: Emergency Medicine

## 2020-12-02 ENCOUNTER — Other Ambulatory Visit: Payer: Self-pay

## 2020-12-02 ENCOUNTER — Ambulatory Visit: Payer: Medicare HMO | Admitting: Nurse Practitioner

## 2020-12-02 DIAGNOSIS — M545 Low back pain, unspecified: Secondary | ICD-10-CM | POA: Diagnosis not present

## 2020-12-02 MED ORDER — MELOXICAM 15 MG PO TABS: 15.0000 mg | ORAL_TABLET | Freq: Every day | ORAL | 0 refills | Status: DC

## 2020-12-02 MED ORDER — METHOCARBAMOL 500 MG PO TABS: 500.0000 mg | ORAL_TABLET | Freq: Four times a day (QID) | ORAL | 0 refills | Status: DC

## 2020-12-02 MED ORDER — KETOROLAC TROMETHAMINE 60 MG/2ML IM SOLN
60.0000 mg | Freq: Once | INTRAMUSCULAR | Status: AC
  Administered 2020-12-02: 60 mg via INTRAMUSCULAR

## 2020-12-02 NOTE — ED Triage Notes (Signed)
Right lower back pain starting one week prior, denies injury. 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 Has had a hard time sleeping due to pain. Denies changes to bowel/bladder functioning, numbness/tingling down legs, fever.

## 2020-12-04 NOTE — ED Provider Notes (Signed)
[facility]    CSN: [id number] Arrival date & time: 12/02/20  2774      History   Chief Complaint Chief Complaint  Patient presents with   Back Pain    HPI Monique Barton is a 80 y.o. female.   Pt reports she lift heavy boxes.  Pt complains of pain in her low back  The history is provided by the patient. No language interpreter was used.  Back Pain Location:  Lumbar spine Quality:  Aching Radiates to:  Does not radiate Pain severity:  Moderate Pain is:  Same all the time Onset quality:  Unable to specify Duration:  1 week Timing:  Constant Context: lifting heavy objects   Worsened by:  Nothing Ineffective treatments:  None tried  Past Medical History:  Diagnosis Date   Hyperlipidemia    Hypertension    Other abnormal glucose    Unspecified vitamin D deficiency    Wrist fracture, right     Patient Active Problem List   Diagnosis Date Noted   Aortic atherosclerosis ([place]) by CT scan 06/04/2019 06/04/2019   Osteopenia 06/16/2018   BMI 22.0-22.9, adult 06/05/2017   CKD (chronic kidney disease) stage 3, GFR 30-59 ml/min ([place]) 06/05/2017   Recurrent major depressive disorder, in full remission ([place]) 04/17/2016   Encounter for Medicare annual wellness exam 12/28/2014   Medication management 10/06/2013   Essential hypertension 11/20/2012   Hyperlipidemia 11/20/2012   Other abnormal glucose (prediabetes) 11/20/2012   Vitamin D deficiency 11/20/2012    Past Surgical History:  Procedure Laterality Date   EYE SURGERY Bilateral 2012    OB History   No obstetric history on file.      Home Medications    Prior to Admission medications   Medication Sig Start Date End Date Taking? Authorizing Provider  meloxicam (MOBIC) 15 MG tablet Take 1 tablet (15 mg total) by mouth daily. 12/02/20  Yes Caryl Ada K, PA-C  methocarbamol (ROBAXIN) 500 MG tablet Take 1 tablet (500 mg total) by mouth 4 (four) times daily. 12/02/20  Yes Caryl Ada K, PA-C   Ascorbic Acid (VITAMIN C) 500 MG CAPS Take 1 capsule by mouth. Not on a regular basis    [provider]  aspirin 81 MG tablet Take 81 mg by mouth daily.    [provider]  Cholecalciferol (VITAMIN D PO) Take 5,000 Units by mouth daily.    [provider]  Magnesium 400 MG TABS Take 1 tablet by mouth daily.    [provider]  Multiple Vitamin (MULTIVITAMIN) tablet Take 1 tablet by mouth daily.    [provider]  olmesartan-hydrochlorothiazide (BENICAR HCT) 20-12.5 MG tablet Take 1 tablet by mouth daily. 09/05/20   Liane Comber, NP  OVER THE COUNTER MEDICATION Takes an OTC Iron tablet daily.    [provider]  pravastatin (PRAVACHOL) 40 MG tablet TAKE 1 TABLET BY MOUTH AT BEDTIME FOR CHOLESTEROL 02/22/20   Liane Comber, NP  sertraline (ZOLOFT) 50 MG tablet TAKE 1 TABLET BY MOUTH DAILY FOR MOOD 06/17/20   Liane Comber, NP  zinc gluconate 50 MG tablet Take 50 mg by mouth daily.    [provider]    Family History Family History  Problem Relation Age of Onset   Diabetes Mother    Heart disease Mother    Hyperlipidemia Mother    Hypertension Mother    Cancer Father        PROSTATE   Diabetes Brother    Diabetes  Brother    Cancer Brother        LUNG   Cancer Brother        BONE    Social History Social History   Tobacco Use   Smoking status: Never    Passive exposure: Yes   Smokeless tobacco: Never   Tobacco comments:    Second hand smoke exposure  Substance Use Topics   Alcohol use: No   Drug use: No     Allergies   Prednisone   Review of Systems Review of Systems  Musculoskeletal:  Positive for back pain.  All other systems reviewed and are negative.   Physical Exam Triage Vital Signs ED Triage Vitals [12/02/20 0843]  Enc Vitals Group     BP (!) 167/69     Pulse Rate 61     Resp 16     Temp 97.9 F (36.6 C)     Temp Source Oral     SpO2 98 %     Weight      Height      Head  Circumference      Peak Flow      Pain Score 8     Pain Loc      Pain Edu?      Excl. in [place]?    No data found.  Updated Vital Signs BP (!) 167/69 (BP Location: Left Arm)   Pulse 61   Temp 97.9 F (36.6 C) (Oral)   Resp 16   SpO2 98%   Visual Acuity Right Eye Distance:   Left Eye Distance:   Bilateral Distance:    Right Eye Near:   Left Eye Near:    Bilateral Near:     Physical Exam Vitals and nursing note reviewed.  Constitutional:      Appearance: She is well-developed.  HENT:     Head: Normocephalic.     Right Ear: Tympanic membrane normal.     Left Ear: Tympanic membrane normal.     Nose: Nose normal.  Cardiovascular:     Rate and Rhythm: Normal rate.  Pulmonary:     Effort: Pulmonary effort is normal.  Abdominal:     General: There is no distension.  Musculoskeletal:        General: Normal range of motion.     Cervical back: Normal range of motion.  Neurological:     Mental Status: She is alert and oriented to person, place, and time.     UC Treatments / Results  Labs (all labs ordered are listed, but only abnormal results are displayed) Labs Reviewed - No data to display  EKG   Radiology No results found.  Procedures Procedures (including critical care time)  Medications Ordered in UC Medications  ketorolac (TORADOL) injection 60 mg (60 mg Intramuscular Given 12/02/20 1100)    Initial Impression / Assessment and Plan / UC Course  I have reviewed the triage vital signs and the nursing notes.  Pertinent labs & imaging results that were available during my care of the patient were reviewed by me and considered in my medical decision making (see chart for details).     MDM:  xray show arthritis, no acute abnormality.   Final Clinical Impressions(s) / UC Diagnoses   Final diagnoses:  Acute right-sided low back pain without sciatica   Discharge Instructions   None    ED Prescriptions     Medication Sig Dispense Auth. Provider    meloxicam (MOBIC) 15 MG tablet Take 1  tablet (15 mg total) by mouth daily. 5 tablet Cait Locust K, PA-C   methocarbamol (ROBAXIN) 500 MG tablet Take 1 tablet (500 mg total) by mouth 4 (four) times daily. 20 tablet Fransico Meadow, [place]      PDMP not reviewed this encounter. An After Visit Summary was printed and given to the patient.    Fransico Meadow, [place] 12/04/20 1404

## 2021-02-12 ENCOUNTER — Encounter: Payer: Self-pay | Admitting: Internal Medicine

## 2021-02-12 ENCOUNTER — Other Ambulatory Visit: Payer: Self-pay | Admitting: Adult Health

## 2021-02-12 NOTE — Progress Notes (Signed)
Annual Screening/Preventative Visit & Comprehensive Evaluation &  Examination  Future Appointments  Date Time Provider Department  02/13/2021  2:00 PM Unk Pinto, MD [facility]  05/12/2021             Wellness 11:00 AM Liane Comber, NP [facility]  02/19/2022  2:00 PM Unk Pinto, MD [facility]        This very nice 81 y.o. [facility] presents for a Screening /Preventative Visit & comprehensive evaluation and management of multiple medical co-morbidities.  Patient has been followed for HTN, HLD, Prediabetes  and Vitamin D Deficiency. In 2021,  CT Scan showed Aortic Atherosclerosis. 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 Patient's BP has been controlled at home and patient denies any cardiac symptoms as chest pain, palpitations, shortness of breath, dizziness or ankle swelling. Today's BP is at goal - 128/60.        Patient's hyperlipidemia is not controlled with diet & Pravastatin. Patient denies myalgias or other medication SE's. Last lipids were not at goal :  Lab Results  Component Value Date   CHOL 196 08/16/2020   HDL 67 08/16/2020   LDLCALC 107 (H) 08/16/2020   TRIG 123 08/16/2020   CHOLHDL 2.9 08/16/2020         Patient has hx/o prediabetes predating (A1c 5.8% /2013 &  6.0% /2016) and patient denies reactive hypoglycemic symptoms, visual blurring, diabetic polys or paresthesias. Last A1c was near goal :  Lab Results  Component Value Date   HGBA1C 5.8 (H) 08/16/2020         Finally, patient has history of Vitamin D Deficiency and last Vitamin D was near goal (70-100) :  Lab Results  Component Value Date   VD25OH 56 08/16/2020    Current Outpatient Medications on File Prior to Visit  Medication Sig   VITAMIN C 500 MG CAPS Take 1 capsule    aspirin 81 MG tablet Take  daily.   VITAMIN D Take 5,000 Units  daily.   Magnesium 400 MG TABS Take 1 tablet daily.   meloxicam  15 MG tablet Take 1 tablet daily.    methocarbamol  500 MG Take 1 tablet 4 times daily.   Multiple Vitamin  Take 1 tablet daily.   olmesartan-hctz 20-12.5 MG  Take 1 tablet daily.   OTC Iron tablet  Takes daily.   pravastatin 40 MG tablet TAKE 1 TABLET AT BEDTIME    sertraline  50 MG tablet TAKE 1 TABLET DAILY    zinc  50 MG tablet Take  daily.    Allergies  Allergen Reactions   Prednisone Other (See Comments)    HIGH DOSE CAUSES INSOMNIA  \  Past Medical History:  Diagnosis Date   Hyperlipidemia    Hypertension    abnormal glucose    vitamin D deficiency    Wrist fracture, right     Health Maintenance  Topic Date Due   Zoster Vaccines- Shingrix (1 of 2) Never done   Pneumonia Vaccine 54+ Years old (2 - PPSV23 if available, else PCV20) 06/08/2015   COVID-19 Vaccine (3 - Pfizer risk series) 04/22/2019   INFLUENZA VACCINE  08/15/2020   TETANUS/TDAP  04/03/2021   DEXA SCAN  Completed   HPV VACCINES  Aged Out    Immunization History  Administered Date(s) Administered   Fluad Quad(high Dose ) 10/02/2018   Influenza Nasal 11/06/2011   Influenza, High Dose  10/06/2013, 09/23/2014,  10/30/2017, 10/08/2019   Influenza-Unspecified 10/24/2012, 10/15/2016   PFIZER SARS-COV-2 Vacc  02/28/2019, 03/25/2019   Pneumococcal -13 06/08/2014   Pneumococcal-23 02/07/2006   Tdap 04/04/2011   Zoster, Live 01/15/2009    Cologard - 07/29/2018 - Negative - Recc 3 yr f/u due July 2023   Last MGM & BMD  -  09/25/2018    Past Surgical History:  Procedure Laterality Date   EYE SURGERY Bilateral 2012     Family History  Problem Relation Age of Onset   Diabetes Mother    Heart disease Mother    Hyperlipidemia Mother    Hypertension Mother    Cancer Father        PROSTATE   Diabetes Brother    Diabetes Brother    Cancer Brother        LUNG   Cancer Brother        BONE     Social History   Tobacco Use   Smoking status: Never    Passive exposure: Yes   Smokeless tobacco: Never   Tobacco comments:    Second  hand smoke exposure  Substance Use Topics   Alcohol use: No   Drug use: No      ROS Constitutional: Denies fever, chills, weight loss/gain, headaches, insomnia,  night sweats, and change in appetite. Does c/o fatigue. Eyes: Denies redness, blurred vision, diplopia, discharge, itchy, watery eyes.  ENT: Denies discharge, congestion, post nasal drip, epistaxis, sore throat, earache, hearing loss, dental pain, Tinnitus, Vertigo, Sinus pain, snoring.  Cardio: Denies chest pain, palpitations, irregular heartbeat, syncope, dyspnea, diaphoresis, orthopnea, PND, claudication, edema Respiratory: denies cough, dyspnea, DOE, pleurisy, hoarseness, laryngitis, wheezing.  Gastrointestinal: Denies dysphagia, heartburn, reflux, water brash, pain, cramps, nausea, vomiting, bloating, diarrhea, constipation, hematemesis, melena, hematochezia, jaundice, hemorrhoids Genitourinary: Denies dysuria, frequency, urgency, nocturia, hesitancy, discharge, hematuria, flank pain Breast: Breast lumps, nipple discharge, bleeding.  Musculoskeletal: Denies arthralgia, myalgia, stiffness, Jt. Swelling, pain, limp, and strain/sprain. Denies falls. Skin: Denies puritis, rash, hives, warts, acne, eczema, changing in skin lesion Neuro: No weakness, tremor, incoordination, spasms, paresthesia, pain Psychiatric: Denies confusion, memory loss, sensory loss. Denies Depression. Endocrine: Denies change in weight, skin, hair change, nocturia, and paresthesia, diabetic polys, visual blurring, hyper / hypo glycemic episodes.  Heme/Lymph: No excessive bleeding, bruising, enlarged lymph nodes.  Physical Exam  BP 128/60    Pulse (!) 55    Temp 97.9 F (36.6 C)    Resp 16    Ht 5' 3.5" (1.613 m)    Wt 126 lb (57.2 kg)    SpO2 96%    BMI 21.97 kg/m   General Appearance: Well nourished, well groomed and in no apparent distress.  Eyes: PERRLA, EOMs, conjunctiva no swelling or erythema, normal fundi and vessels. Sinuses: No  frontal/maxillary tenderness ENT/Mouth: EACs patent / TMs  nl. Nares clear without erythema, swelling, mucoid exudates. Oral hygiene is good. No erythema, swelling, or exudate. Tongue normal, non-obstructing. Tonsils not swollen or erythematous. Hearing normal.  Neck: Supple, thyroid not palpable. No bruits, nodes or JVD. Respiratory: Respiratory effort normal.  BS equal and clear bilateral without rales, rhonci, wheezing or stridor. Cardio: Heart sounds are normal with regular rate and rhythm and no murmurs, rubs or gallops. Peripheral pulses are normal and equal bilaterally without edema. No aortic or femoral bruits. Chest: symmetric with normal excursions and percussion. Breasts: Symmetric, without lumps, nipple discharge, retractions, or fibrocystic changes.  Abdomen: Flat, soft with bowel sounds active. Nontender, no guarding, rebound, hernias, masses, or organomegaly.  Lymphatics: Non tender without lymphadenopathy.  Musculoskeletal: Full ROM all peripheral extremities, joint stability, 5/5 strength, and normal gait. Skin: Warm and dry without rashes, lesions, cyanosis, clubbing or  ecchymosis.  Neuro: Cranial nerves intact, reflexes equal bilaterally. Normal muscle tone, no cerebellar symptoms. Sensation intact.  Pysch: Alert and oriented X 3, normal affect, Insight and Judgment appropriate.    Assessment and Plan  1. Annual Preventative Screening Examination   2. Essential hypertension  - EKG 12-Lead - Urinalysis, Routine w reflex microscopic - Microalbumin / creatinine urine ratio - CBC with Differential/Platelet - COMPLETE METABOLIC PANEL WITH GFR - Magnesium - TSH  3. Hyperlipidemia, mixed  - EKG 12-Lead - Lipid panel - TSH  4. Abnormal glucose  - EKG 12-Lead - Hemoglobin A1c - Insulin, random  5. Vitamin D deficiency  - VITAMIN D 25 Hydroxy  6. 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 Aortic atherosclerosis ([place]) by CT scan 06/04/2019  - EKG 12-Lead - Lipid panel  8. Screening for colorectal cancer  - POC Hemoccult Bld/Stl   9. Screening for ischemic heart disease  - EKG 12-Lead  10. FHx: heart disease  - EKG 12-Lead  11. Medication management  - Urinalysis, Routine w reflex microscopic - Microalbumin / creatinine urine ratio - CBC with Differential/Platelet - COMPLETE METABOLIC PANEL WITH GFR - Magnesium - Lipid panel - TSH - Hemoglobin A1c - Insulin, random - VITAMIN D 25 Hydroxy           Patient was counseled in prudent diet to achieve/maintain BMI less than 25 for weight control, BP monitoring, regular exercise and medications. Discussed med's effects and SE's. Screening labs and tests as requested with regular follow-up as recommended. Over 40 minutes of exam, counseling, chart review and high complex critical decision making was performed.   Kirtland Bouchard, MD

## 2021-02-12 NOTE — Patient Instructions (Signed)

## 2021-02-13 ENCOUNTER — Encounter: Payer: Self-pay | Admitting: Internal Medicine

## 2021-02-13 ENCOUNTER — Ambulatory Visit (INDEPENDENT_AMBULATORY_CARE_PROVIDER_SITE_OTHER): Payer: Medicare HMO | Admitting: Internal Medicine

## 2021-02-13 ENCOUNTER — Other Ambulatory Visit: Payer: Self-pay

## 2021-02-13 VITALS — BP 128/60 | HR 55 | Temp 97.9°F | Resp 16 | Ht 63.5 in | Wt 126.0 lb

## 2021-02-13 DIAGNOSIS — I129 Hypertensive chronic kidney disease with stage 1 through stage 4 chronic kidney disease, or unspecified chronic kidney disease: Secondary | ICD-10-CM | POA: Diagnosis not present

## 2021-02-13 DIAGNOSIS — F5101 Primary insomnia: Secondary | ICD-10-CM

## 2021-02-13 DIAGNOSIS — I10 Essential (primary) hypertension: Secondary | ICD-10-CM

## 2021-02-13 DIAGNOSIS — N1832 Chronic kidney disease, stage 3b: Secondary | ICD-10-CM

## 2021-02-13 DIAGNOSIS — E782 Mixed hyperlipidemia: Secondary | ICD-10-CM | POA: Diagnosis not present

## 2021-02-13 DIAGNOSIS — Z0000 Encounter for general adult medical examination without abnormal findings: Secondary | ICD-10-CM

## 2021-02-13 DIAGNOSIS — Z136 Encounter for screening for cardiovascular disorders: Secondary | ICD-10-CM

## 2021-02-13 DIAGNOSIS — Z8249 Family history of ischemic heart disease and other diseases of the circulatory system: Secondary | ICD-10-CM

## 2021-02-13 DIAGNOSIS — E559 Vitamin D deficiency, unspecified: Secondary | ICD-10-CM

## 2021-02-13 DIAGNOSIS — I700 Atherosclerosis of aorta: Secondary | ICD-10-CM | POA: Diagnosis not present

## 2021-02-13 DIAGNOSIS — Z1211 Encounter for screening for malignant neoplasm of colon: Secondary | ICD-10-CM

## 2021-02-13 DIAGNOSIS — Z79899 Other long term (current) drug therapy: Secondary | ICD-10-CM | POA: Diagnosis not present

## 2021-02-13 DIAGNOSIS — Z0001 Encounter for general adult medical examination with abnormal findings: Secondary | ICD-10-CM

## 2021-02-13 DIAGNOSIS — R7309 Other abnormal glucose: Secondary | ICD-10-CM | POA: Diagnosis not present

## 2021-02-13 MED ORDER — TRAZODONE HCL 150 MG PO TABS: ORAL_TABLET | ORAL | 0 refills | Status: DC

## 2021-02-14 LAB — INSULIN, RANDOM: Insulin: 5.5 u[IU]/mL

## 2021-02-14 LAB — HEMOGLOBIN A1C
Hgb A1c MFr Bld: 5.8 % of total Hgb — ABNORMAL HIGH (ref <5.7)
Mean Plasma Glucose: 120 mg/dL
eAG (mmol/L): 6.6 mmol/L

## 2021-02-14 LAB — CBC WITH DIFFERENTIAL/PLATELET
Absolute Monocytes: 409 cells/uL (ref 200–950)
Basophils Absolute: 49 cells/uL (ref 0–200)
Basophils Relative: 0.8 %
Eosinophils Absolute: 177 cells/uL (ref 15–500)
Eosinophils Relative: 2.9 %
HCT: 35.8 % (ref 35.0–45.0)
Hemoglobin: 11.8 g/dL (ref 11.7–15.5)
Lymphs Abs: 1610 cells/uL (ref 850–3900)
MCH: 30.2 pg (ref 27.0–33.0)
MCHC: 33 g/dL (ref 32.0–36.0)
MCV: 91.6 fL (ref 80.0–100.0)
MPV: 11.3 fL (ref 7.5–12.5)
Monocytes Relative: 6.7 %
Neutro Abs: 3855 cells/uL (ref 1500–7800)
Neutrophils Relative %: 63.2 %
Platelets: 212 10*3/uL (ref 140–400)
RBC: 3.91 10*6/uL (ref 3.80–5.10)
RDW: 12.3 % (ref 11.0–15.0)
Total Lymphocyte: 26.4 %
WBC: 6.1 10*3/uL (ref 3.8–10.8)

## 2021-02-14 LAB — LIPID PANEL
Cholesterol: 181 mg/dL (ref <200)
HDL: 57 mg/dL (ref >=50)
LDL Cholesterol (Calc): 105 mg/dL (calc) — ABNORMAL HIGH
Non-HDL Cholesterol (Calc): 124 mg/dL (calc) (ref <130)
Total CHOL/HDL Ratio: 3.2 (calc) (ref <5.0)
Triglycerides: 98 mg/dL (ref <150)

## 2021-02-14 LAB — COMPLETE METABOLIC PANEL WITH GFR
AG Ratio: 1.7 (calc) (ref 1.0–2.5)
ALT: 18 U/L (ref 6–29)
AST: 24 U/L (ref 10–35)
Albumin: 4.2 g/dL (ref 3.6–5.1)
Alkaline phosphatase (APISO): 68 U/L (ref 37–153)
BUN/Creatinine Ratio: 15 (calc) (ref 6–22)
BUN: 28 mg/dL — ABNORMAL HIGH (ref 7–25)
CO2: 35 mmol/L — ABNORMAL HIGH (ref 20–32)
Calcium: 9.6 mg/dL (ref 8.6–10.4)
Chloride: 101 mmol/L (ref 98–110)
Creat: 1.81 mg/dL — ABNORMAL HIGH (ref 0.60–0.95)
Globulin: 2.5 g/dL (calc) (ref 1.9–3.7)
Glucose, Bld: 85 mg/dL (ref 65–99)
Potassium: 4.3 mmol/L (ref 3.5–5.3)
Sodium: 139 mmol/L (ref 135–146)
Total Bilirubin: 0.4 mg/dL (ref 0.2–1.2)
Total Protein: 6.7 g/dL (ref 6.1–8.1)
eGFR: 28 mL/min/{1.73_m2} — ABNORMAL LOW (ref >=60)

## 2021-02-14 LAB — VITAMIN D 25 HYDROXY (VIT D DEFICIENCY, FRACTURES): Vit D, 25-Hydroxy: 79 ng/mL (ref 30–100)

## 2021-02-14 LAB — URINALYSIS, ROUTINE W REFLEX MICROSCOPIC
Bilirubin Urine: NEGATIVE
Glucose, UA: NEGATIVE
Hgb urine dipstick: NEGATIVE
Ketones, ur: NEGATIVE
Leukocytes,Ua: NEGATIVE
Nitrite: NEGATIVE
Protein, ur: NEGATIVE
Specific Gravity, Urine: 1.009 (ref 1.001–1.035)
pH: 6 (ref 5.0–8.0)

## 2021-02-14 LAB — PTH, INTACT AND CALCIUM
Calcium: 9.6 mg/dL (ref 8.6–10.4)
PTH: 26 pg/mL (ref 16–77)

## 2021-02-14 LAB — MICROALBUMIN / CREATININE URINE RATIO
Creatinine, Urine: 54 mg/dL (ref 20–275)
Microalb Creat Ratio: 6 mcg/mg creat (ref <30)
Microalb, Ur: 0.3 mg/dL

## 2021-02-14 LAB — MAGNESIUM: Magnesium: 2.3 mg/dL (ref 1.5–2.5)

## 2021-02-14 LAB — TSH: TSH: 0.91 mIU/L (ref 0.40–4.50)

## 2021-02-14 NOTE — Progress Notes (Signed)
=============================================================== °=============================================================== ° °-    Kidney functions look better     - Very important to drink adequate amounts of fluids to prevent permanent damage    - Recommend drink at least 6 bottles (16 ounces) of fluids /water /day = 96 Oz ~100 oz  - 100 oz = 3,000 cc or 3 liters / day  - >> That's 1 &1/2 bottles of a 2 liter soda bottle /day !  =============================================================== ===============================================================  -  Total Chol = 181 - OK  =============================================================== ===============================================================  -  A1c = 5.8% - still borderline elevated sugar,  So    - Avoid Sweets, Candy & White Stuff   - White Rice, White [place], White Flour  - Breads &  Pasta =============================================================== ===============================================================  -  Vitamin D = 79 - Excellent - Please keep dose same =============================================================== ===============================================================  -  All Else - CBC - Kidneys - Electrolytes - Liver - Magnesium & Thyroid    - all  Normal / OK =============================================================== ===============================================================  -  Keep up the [place] Work  !  =============================================================== ===============================================================

## 2021-02-17 NOTE — Progress Notes (Signed)
Pt is aware of results and recommendations, did not have any questions for the provider or nurse

## 2021-03-09 ENCOUNTER — Other Ambulatory Visit: Payer: Self-pay | Admitting: Internal Medicine

## 2021-03-09 DIAGNOSIS — F5101 Primary insomnia: Secondary | ICD-10-CM

## 2021-05-12 ENCOUNTER — Ambulatory Visit: Payer: Medicare HMO | Admitting: Adult Health

## 2021-06-01 ENCOUNTER — Ambulatory Visit (INDEPENDENT_AMBULATORY_CARE_PROVIDER_SITE_OTHER): Payer: Medicare HMO | Admitting: Nurse Practitioner

## 2021-06-01 ENCOUNTER — Encounter: Payer: Self-pay | Admitting: Nurse Practitioner

## 2021-06-01 VITALS — BP 130/60 | HR 84 | Temp 97.7°F | Wt 126.6 lb

## 2021-06-01 DIAGNOSIS — D649 Anemia, unspecified: Secondary | ICD-10-CM | POA: Diagnosis not present

## 2021-06-01 DIAGNOSIS — Z0000 Encounter for general adult medical examination without abnormal findings: Secondary | ICD-10-CM | POA: Diagnosis not present

## 2021-06-01 DIAGNOSIS — E559 Vitamin D deficiency, unspecified: Secondary | ICD-10-CM

## 2021-06-01 DIAGNOSIS — I10 Essential (primary) hypertension: Secondary | ICD-10-CM

## 2021-06-01 DIAGNOSIS — N1832 Chronic kidney disease, stage 3b: Secondary | ICD-10-CM

## 2021-06-01 DIAGNOSIS — Z79899 Other long term (current) drug therapy: Secondary | ICD-10-CM | POA: Diagnosis not present

## 2021-06-01 DIAGNOSIS — Z6822 Body mass index (BMI) 22.0-22.9, adult: Secondary | ICD-10-CM

## 2021-06-01 DIAGNOSIS — M85839 Other specified disorders of bone density and structure, unspecified forearm: Secondary | ICD-10-CM

## 2021-06-01 DIAGNOSIS — F3342 Major depressive disorder, recurrent, in full remission: Secondary | ICD-10-CM

## 2021-06-01 DIAGNOSIS — E782 Mixed hyperlipidemia: Secondary | ICD-10-CM | POA: Diagnosis not present

## 2021-06-01 DIAGNOSIS — R7303 Prediabetes: Secondary | ICD-10-CM

## 2021-06-01 DIAGNOSIS — R7309 Other abnormal glucose: Secondary | ICD-10-CM

## 2021-06-01 DIAGNOSIS — I700 Atherosclerosis of aorta: Secondary | ICD-10-CM

## 2021-06-01 NOTE — Progress Notes (Signed)
MEDICARE ANNUAL WELLNESS VISIT AND FOLLOW UP  Assessment:   Diagnoses and all orders for this visit:  Encounter for Medicare annual wellness exam Due Annually   Essential hypertension Well controlled. Continue Benicar HCT Monitor blood pressure at home; call if consistently over 130/80 Continue DASH diet.   Reminder to go to the ER if any CP, SOB, nausea, dizziness, severe HA, changes vision/speech, left arm numbness and tingling and jaw pain.  - CBC with Differential/Platelet - COMPLETE METABOLIC PANEL WITH GFR  Aortic atherosclerosis ([place]) by CT scan 06/04/2019 LDL continues to be slightly elevated. Continue Pravastatin. Work on lifestyle modifications by incorporating diet rich in omega 3's, fish, veggies, fruits, less red meats, fats, greasy foods. Continue to monitor.  - Lipid panel  Osteopenia of forearm, unspecified laterality Last Dexa 2020 reviewed - Osteoporotic Defers Dexa. Defers further medical therapies. Discussed importance of bone health Discussed fall prevention techniques, lighted hallways, good shoe support, changing positions slowly. Continue Vit D and Ca, weight bearing exercises   Stage 3b chronic kidney disease ([place]) Continues to decline. Discussed less cokes, more water Monitor for any decrease in urine flow, oliguria. Avoid nephrotoxic medications and foods including NSAIDS, sodium, saturated fat and trans fats. Referral to Nephrology  - COMPLETE METABOLIC PANEL WITH GFR - Hemoglobin A1c - Ambulatory referral to Nephrology  Hyperlipidemia LDL continues to be slightly elevated. Continue Pravastatin. Work on lifestyle modifications by incorporating diet rich in omega 3's, fish, veggies, fruits, less red meats, fats, greasy foods. Continue to monitor. - Lipid panel  Other abnormal glucose (prediabetes) Prediabetic. Continue lifestyle modifications Continue to limit intake of sugars, carbohydrates  Prediabetes Continue lifestyle  modifications. Continue to limit intake of sugars, carbohydrates  - Hemoglobin A1c  Vitamin D deficiency At goal. Continue Vitamin D Supplement  Recurrent major depressive disorder, in full remission ([place]) Controlled.   Continue Sertraline   BMI 22.0-22.9, adult No weight change Continue to eat a nutritionally dense foods. Discussed Protein shake/supplement Continue to remain active.  Medication management No medication changes today. All medications reviewed and discussed. All questions and concerns have been addressed.  - CBC with Differential/Platelet - COMPLETE METABOLIC PANEL WITH GFR - Lipid panel    Over 40 minutes of exam, counseling, chart review and critical decision making was performed Future Appointments  Date Time Provider [place]  09/06/2021  9:30 AM Unk Pinto, MD [facility] None  02/19/2022  2:00 PM Unk Pinto, MD [facility] None  06/04/2022  9:00 AM Darrol Jump, NP [facility] None     Plan:   During the course of the visit the patient was educated and counseled about appropriate screening and preventive services including:   Pneumococcal vaccine  Prevnar 13 Influenza vaccine Td vaccine Screening electrocardiogram Bone densitometry screening Colorectal cancer screening Diabetes screening Glaucoma screening Nutrition counseling  Advanced directives: requested   Subjective:  Monique Barton is a 81 y.o. female who presents for Medicare Annual Wellness Visit and 3 month follow up.   She has hx of depression and remains in remission on zoloft 50 mg daily.  Feels mood is well controlled. Her daughter passed in 12/2020 from cancer.  BMI is Body mass index is 22.07 kg/m., she has been working on diet and exercise. She is drinking daily Coke's and less water. She is doing line dancing and sometimes gardens.  Wt Readings from Last 3 Encounters:  06/01/21 126 lb 9.6 oz (57.4 kg)  02/13/21 126 lb (57.2 kg)  08/16/20 127 lb  (57.6 kg)  She is now checking BP in the home and reports average around 130/80.  She does workout. She denies chest pain, shortness of breath, dizziness.   She is on cholesterol medication and denies myalgias. Her cholesterol is at goal. The cholesterol last visit was:   Lab Results  Component Value Date   CHOL 181 02/13/2021   HDL 57 02/13/2021   LDLCALC 105 (H) 02/13/2021   TRIG 98 02/13/2021   CHOLHDL 3.2 02/13/2021    She has been working on diet and exercise for glucose management (recent hx of prediabetes), and denies foot ulcerations, increased appetite, nausea, paresthesia of the feet, polydipsia, polyuria, visual disturbances, vomiting and weight loss.  Last A1C in the office was:  Lab Results  Component Value Date   HGBA1C 5.8 (H) 02/13/2021   She has CKD 3b associated with htn monitored at this office, on ARB  Last GFR:  Lab Results  Component Value Date   GFRNONAA 29 (L) 05/12/2020   GFRNONAA 32 (L) 01/28/2020   GFRNONAA 29 (L) 10/01/2019  She drinks ~2 bottles daily. Denies current or hx of NSAID use.   Patient is on Vitamin D supplement and near goal of 60-100:    Lab Results  Component Value Date   VD25OH 79 02/13/2021      Medication Review: Current Outpatient Medications on File Prior to Visit  Medication Sig Dispense Refill   Ascorbic Acid (VITAMIN C) 500 MG CAPS Take 1 capsule by mouth. Not on a regular basis     aspirin 81 MG tablet Take 81 mg by mouth daily.     Cholecalciferol (VITAMIN D PO) Take 5,000 Units by mouth daily.     Magnesium 400 MG TABS Take 1 tablet by mouth daily.     Multiple Vitamin (MULTIVITAMIN) tablet Take 1 tablet by mouth daily.     olmesartan-hydrochlorothiazide (BENICAR HCT) 20-12.5 MG tablet Take 1 tablet by mouth daily. 90 tablet 3   OVER THE COUNTER MEDICATION Takes an OTC Iron tablet daily.     pravastatin (PRAVACHOL) 40 MG tablet TAKE 1 TABLET BY MOUTH EVERY DAY AT BEDTIME FOR CHOLESTEROL 90 tablet 3   sertraline  (ZOLOFT) 50 MG tablet TAKE 1 TABLET BY MOUTH DAILY FOR MOOD 90 tablet 3   traZODone (DESYREL) 150 MG tablet TAKE 1/3 TO 1/2 TO 1 TABLET 1 TO 2 HOURS BEFORE BEDTIME AS NEEDED FOR SLEEP 90 tablet 1   zinc gluconate 50 MG tablet Take 50 mg by mouth daily.     meloxicam (MOBIC) 15 MG tablet Take 1 tablet (15 mg total) by mouth daily. (Patient not taking: Reported on 06/01/2021) 5 tablet 0   methocarbamol (ROBAXIN) 500 MG tablet Take 1 tablet (500 mg total) by mouth 4 (four) times daily. (Patient not taking: Reported on 06/01/2021) 20 tablet 0   No current facility-administered medications on file prior to visit.    Allergies  Allergen Reactions   Prednisone Other (See Comments)    HIGH DOSE CAUSES INSOMNIA    Current Problems (verified) Patient Active Problem List   Diagnosis Date Noted   Aortic atherosclerosis ([place]) by CT scan 06/04/2019 06/04/2019   Osteopenia 06/16/2018   BMI 22.0-22.9, adult 06/05/2017   CKD (chronic kidney disease) stage 3, GFR 30-59 ml/min ([place]) 06/05/2017   Recurrent major depressive disorder, in full remission ([place]) 04/17/2016   Encounter for Medicare annual wellness exam 12/28/2014   Medication management 10/06/2013   Essential hypertension 11/20/2012   Hyperlipidemia 11/20/2012   Other abnormal glucose (  prediabetes) 11/20/2012   Vitamin D deficiency 11/20/2012    Screening Tests Immunization History  Administered Date(s) Administered   Fluad Quad(high Dose 65+) 10/02/2018   Influenza Nasal 11/06/2011   Influenza, High Dose Seasonal PF 10/06/2013, 09/23/2014, 10/30/2017, 10/08/2019   Influenza-Unspecified 10/24/2012, 10/15/2016   PFIZER(Purple Top)SARS-COV-2 Vaccination 02/28/2019, 03/25/2019   Pneumococcal Conjugate-13 06/08/2014   Pneumococcal-Unspecified 02/07/2006   Tdap 04/04/2011   Zoster, Live 01/15/2009    Preventative care: Last colonoscopy: 1999 declines further work up [place] 07/2018 Mammogram 09/2018 declines further work up [place]  09/2018 osteopenia - T -2.0  declines further work up PAP remote  Tdap 2013 Influenza 09/2019 Prenvar 13 2016 Pneumonia 2008 Zoster 2011, planning to get shingrix Covid 19 2/2, pfizer   Names of Other Physician/Practitioners you currently use: 1. [facility] here for primary care 2. Dr. Jefm Petty, eye doctor, 2021 3. Not seeing one, dentist, last visit remote, needs to schedule   Patient Care Team: Unk Pinto, MD as PCP - General (Internal Medicine)  SURGICAL HISTORY She  has a past surgical history that includes Eye surgery (Bilateral, 2012). FAMILY HISTORY Her family history includes Cancer in her brother, brother, and father; Diabetes in her brother, brother, and mother; Heart disease in her mother; Hyperlipidemia in her mother; Hypertension in her mother. SOCIAL HISTORY She  reports that she has never smoked. She has been exposed to tobacco smoke. She has never used smokeless tobacco. She reports that she does not drink alcohol and does not use drugs.   MEDICARE WELLNESS OBJECTIVES: Physical activity: Current Exercise Habits: Home exercise routine, Type of exercise: walking, Time (Minutes): 15, Frequency (Times/Week): 5, Weekly Exercise (Minutes/Week): 75, Intensity: Mild, Exercise limited by: Other - see comments Cardiac risk factors: Cardiac Risk Factors include: advanced age (>41mn, >>52women) Depression/mood screen:      06/01/2021    9:59 AM  Depression screen PHQ 2/9  Decreased Interest 0  Down, Depressed, Hopeless 0  PHQ - 2 Score 0    ADLs:     06/01/2021   10:01 AM 08/21/2020    2:33 AM  In your present state of health, do you have any difficulty performing the following activities:  Hearing?  0  Vision? 0 0  Difficulty concentrating or making decisions? 0 0  Walking or climbing stairs? 0 0  Dressing or bathing? 0 0  Doing errands, shopping? 0 0  Preparing Food and eating ? N   Using the Toilet? N   In the past six  months, have you accidently leaked urine? N   Do you have problems with loss of bowel control? N   Managing your Medications? N   Managing your Finances? N   Housekeeping or managing your Housekeeping? N      Cognitive Testing  Alert? Yes  Normal Appearance?Yes  Oriented to person? Yes  Place? Yes   Time? Yes  Recall of three objects?  Yes  Can perform simple calculations? Yes  Displays appropriate judgment?Yes  Can read the correct time from a watch face?Yes  EOL planning: Does Patient Have a Medical Advance Directive?: No Would patient like information on creating a medical advance directive?: No - Patient declined   Review of Systems  Constitutional:  Negative for malaise/fatigue and weight loss.  HENT:  Negative for hearing loss and tinnitus.   Eyes:  Negative for blurred vision and double vision.  Respiratory:  Negative for cough, sputum production, shortness of breath and wheezing.   Cardiovascular:  Negative for chest pain, palpitations, orthopnea, claudication, leg swelling and PND.  Gastrointestinal:  Negative for abdominal pain, blood in stool, constipation, diarrhea, heartburn, melena, nausea and vomiting.  Genitourinary: Negative.   Musculoskeletal:  Negative for falls, joint pain and myalgias.  Skin:  Negative for rash.  Neurological:  Negative for dizziness, tingling, sensory change, weakness and headaches.  Endo/Heme/Allergies:  Negative for polydipsia.  Psychiatric/Behavioral: Negative.  Negative for depression, memory loss, substance abuse and suicidal ideas. The patient is not nervous/anxious and does not have insomnia.   All other systems reviewed and are negative.   Objective:     Today's Vitals   06/01/21 0924  BP: 130/60  Pulse: 84  Temp: 97.7 F (36.5 C)  SpO2: 99%  Weight: 126 lb 9.6 oz (57.4 kg)   Body mass index is 22.07 kg/m.  General appearance: alert, no distress, WD/WN, female HEENT: normocephalic, sclerae anicteric, TMs pearly, nares  patent, no discharge or erythema, mask in place; oral exam deferred. Hearing normal.  Neck: supple, no lymphadenopathy, no thyromegaly, no masses Heart: RRR, normal S1, S2, no murmurs Lungs: CTA bilaterally, no wheezes, rhonchi, or rales Abdomen: +bs, soft, non tender, non distended, no masses, no hepatomegaly, no splenomegaly Musculoskeletal: nontender, no swelling, no obvious deformity Extremities: no edema, no cyanosis, no clubbing Pulses: 2+ symmetric, upper and lower extremities, normal cap refill Neurological: alert, oriented x 3, CN2-12 intact, strength normal upper extremities and lower extremities, sensation normal throughout, DTRs 2+ throughout, no cerebellar signs, gait normal Psychiatric: normal affect, behavior normal, pleasant   Medicare Attestation I have personally reviewed: The patient's medical and social history Their use of alcohol, tobacco or illicit drugs Their current medications and supplements The patient's functional ability including ADLs,fall risks, home safety risks, cognitive, and hearing and visual impairment Diet and physical activities Evidence for depression or mood disorders  The patient's weight, height, BMI, and visual acuity have been recorded in the chart.  I have made referrals, counseling, and provided education to the patient based on review of the above and I have provided the patient with a written personalized care plan for preventive services.     Darrol Jump, NP   06/01/2021

## 2021-06-01 NOTE — Patient Instructions (Signed)
Food Basics for Chronic Kidney Disease Chronic kidney disease (CKD) occurs when the kidneys are permanently damaged over a long period of time. When your kidneys are not working well, they cannot remove waste, fluids, and other substances from your blood as well as they did before. The substances can build up, which can worsen kidney damage and affect how your body functions. Certain foods lead to a buildup of these substances. By changing your diet, you can help prevent more kidney damage and delay or prevent the need for dialysis. What are tips for following this plan? Reading food labels Check the amount of salt (sodium) in foods. Choose foods that have less than 300 milligrams (mg) per serving. Check the ingredient list for phosphorus or potassium-based additives or preservatives. Check the amount of saturated fat and trans fat. Limit or avoid these fats as told by your dietitian. Shopping Avoid buying foods that are: Processed or prepackaged. Calcium-enriched or that have calcium added to them (are fortified). Do not buy foods that have salt or sodium listed among the first five ingredients. Buy canned vegetables and beans that say "no salt added" or "low sodium" and rinse them before eating. Cooking Soak vegetables, such as potatoes, before cooking to reduce potassium. To do this: Peel and cut the vegetables into small pieces. Soak the vegetables in warm water for at least 2 hours. For every 1 cup of vegetables, use 10 cups of water. Drain and rinse the vegetables with warm water. Boil the vegetables for at least 5 minutes. Meal planning Limit the amount of protein you eat from plant and animal sources each day. Do not add salt to food when cooking or before eating. Eat meals and snacks at around the same time each day. General information Talk with your health care provider about whether you should take a vitamin and mineral supplement. Use standard measuring cups and spoons to  measure servings of foods. Use a kitchen scale to measure portions of protein foods. If told by your health care provider, avoid drinking too much fluid. Measure and count all liquids, including water, ice, soups, flavored gelatin, and frozen desserts such as ice pops or ice cream. If you have diabetes: If you have diabetes (diabetes mellitus) and CKD, it is important to keep your blood sugar (glucose) in the target range recommended by your health care provider. Follow your diabetes management plan. This may include: Checking your blood glucose regularly. Taking medicines by mouth, taking insulin, or taking both. Exercising for at least 30 minutes on 5 or more days each week, or as told by your health care provider. Tracking how many servings of carbohydrates you eat at each meal. You may be given specific guidelines on how much of certain foods and nutrients you may eat, depending on your stage of kidney disease and whether you have high blood pressure (hypertension). Follow your meal plan as told by your dietitian. What nutrients should I limit? Work with your health care provider and dietitian to develop a meal plan that is right for you. Foods you can eat and foods you should limit or avoid will depend on the stage of your kidney disease and any other health conditions you have. The items listed below are not a complete list. Talk with your dietitian about what dietary choices are best for you. Potassium Potassium affects how steadily your heart beats. If too much potassium builds up in your blood, the potassium can cause an irregular heartbeat or even a heart attack. You   may need to limit or avoid foods that are high in potassium, such as: Milk and soy milk. Fruits, such as bananas, apricots, nectarines, melon, prunes, raisins, kiwi, and oranges. Vegetables, such as potatoes, sweet potatoes, yams, tomatoes, leafy greens, beets, avocado, pumpkin, and winter squash. White and lima  beans. Whole-wheat breads and pastas. Beans and nuts. Phosphorus Phosphorus is a mineral found in your bones. A balance between calcium and phosphorus is needed to build and maintain healthy bones. Too much phosphorus pulls calcium from your bones. This can make your bones weak and more likely to break. Too much phosphorus can also make your skin itch. You may need to limit or avoid foods that are high in phosphorus, such as: Milk and dairy products. Dried beans and peas. Tofu, soy milk, and other soy-based meat replacements. Dark-colored sodas. Nuts and peanut butter. Meat, poultry, and fish. Bran cereals and oatmeal. Protein  Protein helps you make and keep muscle. It also helps to repair your body's cells and tissues. One of the natural breakdown products of protein is a waste product called urea. When your kidneys are not working properly, they cannot remove wastes, such as urea. Reducing how much protein you eat can help prevent a buildup of urea in your blood. Depending on your stage of kidney disease, you may need to limit foods that are high in protein. Sources of animal protein include: Meat (all types). Fish and seafood. Poultry. Eggs. Dairy. Other protein foods include: Beans and legumes. Nuts and nut butter. Soy and tofu.  Sodium Sodium helps to maintain a healthy balance of fluids in your body. Too much sodium can increase your blood pressure and have a negative effect on your heart and lungs. Too much sodium can also cause your body to retain too much fluid, making your kidneys work harder. Most people should have less than 2,300 mg of sodium each day. If you have hypertension, you may need to limit your sodium to 1,500 mg each day. You may need to limit or avoid foods that are high in sodium, such as: Salt seasonings. Soy sauce. Cured and processed meats. Salted crackers and snack foods. Fast food. Canned soups and most canned foods. Pickled foods. Vegetable  juice. Boxed mixes or ready-to-eat boxed meals and side dishes. Bottled dressings, sauces, and marinades. Talk with your dietitian about how much potassium, phosphorus, protein, and sodium you may have each day. Summary Chronic kidney disease (CKD) can lead to a buildup of waste and extra substances in the body. Certain foods lead to a buildup of these substances. By changing your diet as told, you can help prevent more kidney damage and delay or prevent the need for dialysis. Food intake changes are different for each person with CKD. Work with a dietitian to set up nutrient goals and a meal plan that is right for you. If you have diabetes and CKD, it is important to keep your blood sugar in the target range recommended by your health care provider. This information is not intended to replace advice given to you by your health care provider. Make sure you discuss any questions you have with your health care provider. Document Revised: 04/27/2019 Document Reviewed: 04/27/2019 Elsevier Patient Education  2023 [employer]  

## 2021-06-02 ENCOUNTER — Other Ambulatory Visit: Payer: Self-pay | Admitting: Nurse Practitioner

## 2021-06-02 DIAGNOSIS — D649 Anemia, unspecified: Secondary | ICD-10-CM

## 2021-06-03 LAB — COMPLETE METABOLIC PANEL WITH GFR
AG Ratio: 1.7 (calc) (ref 1.0–2.5)
ALT: 12 U/L (ref 6–29)
AST: 22 U/L (ref 10–35)
Albumin: 4.2 g/dL (ref 3.6–5.1)
Alkaline phosphatase (APISO): 66 U/L (ref 37–153)
BUN/Creatinine Ratio: 19 (calc) (ref 6–22)
BUN: 34 mg/dL — ABNORMAL HIGH (ref 7–25)
CO2: 28 mmol/L (ref 20–32)
Calcium: 9.7 mg/dL (ref 8.6–10.4)
Chloride: 105 mmol/L (ref 98–110)
Creat: 1.8 mg/dL — ABNORMAL HIGH (ref 0.60–0.95)
Globulin: 2.5 g/dL (calc) (ref 1.9–3.7)
Glucose, Bld: 88 mg/dL (ref 65–99)
Potassium: 4.5 mmol/L (ref 3.5–5.3)
Sodium: 141 mmol/L (ref 135–146)
Total Bilirubin: 0.4 mg/dL (ref 0.2–1.2)
Total Protein: 6.7 g/dL (ref 6.1–8.1)
eGFR: 28 mL/min/{1.73_m2} — ABNORMAL LOW (ref >=60)

## 2021-06-03 LAB — CBC WITH DIFFERENTIAL/PLATELET
Absolute Monocytes: 331 cells/uL (ref 200–950)
Basophils Absolute: 48 cells/uL (ref 0–200)
Basophils Relative: 1 %
Eosinophils Absolute: 158 cells/uL (ref 15–500)
Eosinophils Relative: 3.3 %
HCT: 33.8 % — ABNORMAL LOW (ref 35.0–45.0)
Hemoglobin: 11.2 g/dL — ABNORMAL LOW (ref 11.7–15.5)
Lymphs Abs: 1589 cells/uL (ref 850–3900)
MCH: 30.7 pg (ref 27.0–33.0)
MCHC: 33.1 g/dL (ref 32.0–36.0)
MCV: 92.6 fL (ref 80.0–100.0)
MPV: 11.6 fL (ref 7.5–12.5)
Monocytes Relative: 6.9 %
Neutro Abs: 2674 cells/uL (ref 1500–7800)
Neutrophils Relative %: 55.7 %
Platelets: 195 10*3/uL (ref 140–400)
RBC: 3.65 10*6/uL — ABNORMAL LOW (ref 3.80–5.10)
RDW: 12.4 % (ref 11.0–15.0)
Total Lymphocyte: 33.1 %
WBC: 4.8 10*3/uL (ref 3.8–10.8)

## 2021-06-03 LAB — LIPID PANEL
Cholesterol: 173 mg/dL (ref <200)
HDL: 53 mg/dL (ref >=50)
LDL Cholesterol (Calc): 97 mg/dL (calc)
Non-HDL Cholesterol (Calc): 120 mg/dL (calc) (ref <130)
Total CHOL/HDL Ratio: 3.3 (calc) (ref <5.0)
Triglycerides: 129 mg/dL (ref <150)

## 2021-06-03 LAB — IRON,TIBC AND FERRITIN PANEL
%SAT: 35 % (calc) (ref 16–45)
Ferritin: 146 ng/mL (ref 16–288)
Iron: 101 ug/dL (ref 45–160)
TIBC: 286 mcg/dL (calc) (ref 250–450)

## 2021-06-03 LAB — HEMOGLOBIN A1C
Hgb A1c MFr Bld: 5.5 % of total Hgb (ref <5.7)
Mean Plasma Glucose: 111 mg/dL
eAG (mmol/L): 6.2 mmol/L

## 2021-06-03 LAB — TEST AUTHORIZATION

## 2021-06-07 ENCOUNTER — Other Ambulatory Visit: Payer: Self-pay | Admitting: Adult Health

## 2021-06-19 DIAGNOSIS — E785 Hyperlipidemia, unspecified: Secondary | ICD-10-CM | POA: Diagnosis not present

## 2021-06-19 DIAGNOSIS — I10 Essential (primary) hypertension: Secondary | ICD-10-CM | POA: Diagnosis not present

## 2021-06-19 DIAGNOSIS — I700 Atherosclerosis of aorta: Secondary | ICD-10-CM | POA: Diagnosis not present

## 2021-06-19 DIAGNOSIS — G4700 Insomnia, unspecified: Secondary | ICD-10-CM | POA: Diagnosis not present

## 2021-06-19 DIAGNOSIS — Z809 Family history of malignant neoplasm, unspecified: Secondary | ICD-10-CM | POA: Diagnosis not present

## 2021-06-19 DIAGNOSIS — R32 Unspecified urinary incontinence: Secondary | ICD-10-CM | POA: Diagnosis not present

## 2021-06-19 DIAGNOSIS — R69 Illness, unspecified: Secondary | ICD-10-CM | POA: Diagnosis not present

## 2021-06-19 DIAGNOSIS — Z8249 Family history of ischemic heart disease and other diseases of the circulatory system: Secondary | ICD-10-CM | POA: Diagnosis not present

## 2021-06-19 DIAGNOSIS — Z7982 Long term (current) use of aspirin: Secondary | ICD-10-CM | POA: Diagnosis not present

## 2021-09-05 DIAGNOSIS — N390 Urinary tract infection, site not specified: Secondary | ICD-10-CM | POA: Diagnosis not present

## 2021-09-05 DIAGNOSIS — I129 Hypertensive chronic kidney disease with stage 1 through stage 4 chronic kidney disease, or unspecified chronic kidney disease: Secondary | ICD-10-CM | POA: Diagnosis not present

## 2021-09-05 DIAGNOSIS — E785 Hyperlipidemia, unspecified: Secondary | ICD-10-CM | POA: Diagnosis not present

## 2021-09-05 DIAGNOSIS — N184 Chronic kidney disease, stage 4 (severe): Secondary | ICD-10-CM | POA: Diagnosis not present

## 2021-09-05 DIAGNOSIS — D631 Anemia in chronic kidney disease: Secondary | ICD-10-CM | POA: Diagnosis not present

## 2021-09-05 DIAGNOSIS — N1832 Chronic kidney disease, stage 3b: Secondary | ICD-10-CM | POA: Diagnosis not present

## 2021-09-05 DIAGNOSIS — N2581 Secondary hyperparathyroidism of renal origin: Secondary | ICD-10-CM | POA: Diagnosis not present

## 2021-09-06 ENCOUNTER — Ambulatory Visit: Payer: Medicare HMO | Admitting: Internal Medicine

## 2021-09-08 ENCOUNTER — Other Ambulatory Visit: Payer: Self-pay | Admitting: Nephrology

## 2021-09-08 DIAGNOSIS — N2581 Secondary hyperparathyroidism of renal origin: Secondary | ICD-10-CM

## 2021-09-08 DIAGNOSIS — E785 Hyperlipidemia, unspecified: Secondary | ICD-10-CM

## 2021-09-08 DIAGNOSIS — N184 Chronic kidney disease, stage 4 (severe): Secondary | ICD-10-CM

## 2021-09-08 DIAGNOSIS — I129 Hypertensive chronic kidney disease with stage 1 through stage 4 chronic kidney disease, or unspecified chronic kidney disease: Secondary | ICD-10-CM

## 2021-09-08 DIAGNOSIS — D631 Anemia in chronic kidney disease: Secondary | ICD-10-CM

## 2021-09-11 ENCOUNTER — Ambulatory Visit
Admission: RE | Admit: 2021-09-11 | Discharge: 2021-09-11 | Disposition: A | Discharge status: Home / Self Care | Payer: Medicare HMO | Source: Ambulatory Visit | Attending: Nephrology | Admitting: Nephrology

## 2021-09-11 DIAGNOSIS — N189 Chronic kidney disease, unspecified: Secondary | ICD-10-CM

## 2021-09-11 DIAGNOSIS — N184 Chronic kidney disease, stage 4 (severe): Secondary | ICD-10-CM | POA: Diagnosis not present

## 2021-09-11 DIAGNOSIS — N271 Small kidney, bilateral: Secondary | ICD-10-CM | POA: Diagnosis not present

## 2021-09-11 DIAGNOSIS — N2581 Secondary hyperparathyroidism of renal origin: Secondary | ICD-10-CM

## 2021-09-11 DIAGNOSIS — E785 Hyperlipidemia, unspecified: Secondary | ICD-10-CM

## 2021-09-11 DIAGNOSIS — N281 Cyst of kidney, acquired: Secondary | ICD-10-CM | POA: Diagnosis not present

## 2021-09-11 DIAGNOSIS — I129 Hypertensive chronic kidney disease with stage 1 through stage 4 chronic kidney disease, or unspecified chronic kidney disease: Secondary | ICD-10-CM

## 2021-09-14 DIAGNOSIS — H1045 Other chronic allergic conjunctivitis: Secondary | ICD-10-CM | POA: Diagnosis not present

## 2021-09-14 DIAGNOSIS — Z961 Presence of intraocular lens: Secondary | ICD-10-CM | POA: Diagnosis not present

## 2021-09-14 DIAGNOSIS — H0102B Squamous blepharitis left eye, upper and lower eyelids: Secondary | ICD-10-CM | POA: Diagnosis not present

## 2021-09-14 DIAGNOSIS — H527 Unspecified disorder of refraction: Secondary | ICD-10-CM | POA: Diagnosis not present

## 2021-09-14 DIAGNOSIS — H0102A Squamous blepharitis right eye, upper and lower eyelids: Secondary | ICD-10-CM | POA: Diagnosis not present

## 2021-09-26 ENCOUNTER — Encounter: Payer: Self-pay | Admitting: Internal Medicine

## 2021-09-26 NOTE — Progress Notes (Unsigned)
Future Appointments  Date Time Provider Department  09/27/2021                        6 month 11:30 AM Unk Pinto, MD [facility]  02/19/2022                          cpe  2:00 PM Unk Pinto, MD [facility]  06/04/2022                        wellness  9:00 AM Darrol Jump, NP [facility]    History of Present Illness:       This very nice 81 y.o. WWF presents for  follow up with HTN, HLD, Pre-Diabetes and Vitamin D Deficiency. CT Scan in 2021 showed Aortic Atherosclerosis.       Patient is treated for HTN  (2005) /HTCVD with LPF7T & BP has been controlled at home. Today's BP is  at goal - 128/60 . Patient has had no complaints of any cardiac type chest pain, palpitations, dyspnea / orthopnea / PND, dizziness, claudication, or dependent edema.       Hyperlipidemia is near controlled with diet & Pravastatin. Patient denies myalgias or other med SE's. Last Lipids were near goal:  Lab Results  Component Value Date   CHOL 186 05/12/2020   HDL 62 05/12/2020   LDLCALC 105 (H) 05/12/2020   TRIG 101 05/12/2020   CHOLHDL 3.0 05/12/2020    Also, the patient has history of PreDiabetes (A1c 5.8% /2013 &  6.0% /2016) and has had no symptoms of reactive hypoglycemia, diabetic polys, paresthesias or visual blurring.  Last A1c was  at goal:  Lab Results  Component Value Date   HGBA1C 5.5 06/01/2021                                                          Further, the patient also has history of Vitamin D Deficiency and supplements vitamin D without any suspected side-effects. Last vitamin D was at goal:  Lab Results  Component Value Date   VD25OH 79 02/13/2021       Current Outpatient Medications:    Ascorbic Acid (VITAMIN C) 500 MG CAPS, Take 1 capsule by mouth. Not on a regular basis, Disp: , Rfl:    aspirin 81 MG tablet, Take 81 mg by mouth daily., Disp: , Rfl:    Cholecalciferol (VITAMIN D PO), Take 5,000 Units by mouth daily., Disp: , Rfl:    Magnesium 400 MG TABS,  Take 1 tablet by mouth daily., Disp: , Rfl:    Multiple Vitamin (MULTIVITAMIN) tablet, Take 1 tablet by mouth daily., Disp: , Rfl:    olmesartan-hydrochlorothiazide (BENICAR HCT) 20-12.5 MG tablet, Take 1 tablet by mouth daily., Disp: 90 tablet, Rfl: 3   OVER THE COUNTER MEDICATION, Takes an OTC Iron tablet daily., Disp: , Rfl:    pravastatin (PRAVACHOL) 40 MG tablet, TAKE 1 TABLET BY MOUTH EVERY DAY AT BEDTIME FOR CHOLESTEROL, Disp: 90 tablet, Rfl: 3   sertraline (ZOLOFT) 50 MG tablet, TAKE 1 TABLET BY MOUTH DAILY FOR MOOD, Disp: 90 tablet, Rfl: 3   traZODone (DESYREL) 150 MG tablet, TAKE 1/3 TO 1/2 TO 1 TABLET 1 TO 2 HOURS BEFORE  BEDTIME AS NEEDED FOR SLEEP, Disp: 90 tablet, Rfl: 1   zinc gluconate 50 MG tablet, Take 50 mg by mouth daily., Disp: , Rfl:     Allergies  Allergen Reactions   Prednisone Other (See Comments)    HIGH DOSE CAUSES INSOMNIA    PMHx:   Past Medical History:  Diagnosis Date   Hyperlipidemia    Hypertension    Other abnormal glucose    vitamin D deficiency    Wrist fracture, right      Immunization History  Administered Date(s) Administered   Fluad Quad (high Dose) 10/02/2018   Influenza Nasal 11/06/2011   Influenza, High Dose  10/30/2017, 10/08/2019   Influenza 10/24/2012, 10/15/2016   PFIZER SARS -COV-2 Vacc 02/28/2019, 03/25/2019   Pneumococcal -13 06/08/2014   Pneumococcal-23 02/07/2006   Tdap 04/04/2011   Zoster, Live 01/15/2009     Past Surgical History:  Procedure Laterality Date   EYE SURGERY Bilateral 2012    FHx:    Reviewed / unchanged  SHx:    Reviewed / unchanged   Systems Review:  Constitutional: Denies fever, chills, wt changes, headaches, insomnia, fatigue, night sweats, change in appetite. Eyes: Denies redness, blurred vision, diplopia, discharge, itchy, watery eyes.  ENT: Denies discharge, congestion, post nasal drip, epistaxis, sore throat, earache, hearing loss, dental pain, tinnitus, vertigo, sinus pain, snoring.  CV:  Denies chest pain, palpitations, irregular heartbeat, syncope, dyspnea, diaphoresis, orthopnea, PND, claudication or edema. Respiratory: denies cough, dyspnea, DOE, pleurisy, hoarseness, laryngitis, wheezing.  Gastrointestinal: Denies dysphagia, odynophagia, heartburn, reflux, water brash, abdominal pain or cramps, nausea, vomiting, bloating, diarrhea, constipation, hematemesis, melena, hematochezia  or hemorrhoids. Genitourinary: Denies dysuria, frequency, urgency, nocturia, hesitancy, discharge, hematuria or flank pain. Musculoskeletal: Denies arthralgias, myalgias, stiffness, jt. swelling, pain, limping or strain/sprain.  Skin: Denies pruritus, rash, hives, warts, acne, eczema or change in skin lesion(s). Neuro: No weakness, tremor, incoordination, spasms, paresthesia or pain. Psychiatric: Denies confusion, memory loss or sensory loss. Endo: Denies change in weight, skin or hair change.  Heme/Lymph: No excessive bleeding, bruising or enlarged lymph nodes.  Physical Exam  BP 128/60   Pulse 60   Temp 97.8 F (36.6 C)   Resp 17   Ht 5' 3.5" (1.613 m)   Wt 127 lb 9.6 oz (57.9 kg)   SpO2 99%   BMI 22.25 kg/m   Appears  well nourished, well groomed  and in no distress.  Eyes: PERRLA, EOMs, conjunctiva no swelling or erythema. Sinuses: No frontal/maxillary tenderness ENT/Mouth: EAC's clear, TM's nl w/o erythema, bulging. Nares clear w/o erythema, swelling, exudates. Oropharynx clear without erythema or exudates. Oral hygiene is good. Tongue normal, non obstructing. Hearing intact.  Neck: Supple. Thyroid not palpable. Car 2+/2+ without bruits, nodes or JVD. Chest: Respirations nl with BS clear & equal w/o rales, rhonchi, wheezing or stridor.  Cor: Heart sounds normal w/ regular rate and rhythm without sig. murmurs, gallops, clicks or rubs. Peripheral pulses normal and equal  without edema.  Abdomen: Soft & bowel sounds normal. Non-tender w/o guarding, rebound, hernias, masses or  organomegaly.  Lymphatics: Unremarkable.  Musculoskeletal: Full ROM all peripheral extremities, joint stability, 5/5 strength and normal gait.  Skin: Warm, dry without exposed rashes, lesions or ecchymosis apparent.  Neuro: Cranial nerves intact, reflexes equal bilaterally. Sensory-motor testing grossly intact. Tendon reflexes grossly intact.  Pysch: Alert & oriented x 3.  Insight and judgement nl & appropriate. No ideations.  Assessment and Plan:  1. Essential hypertension  - Continue medication, monitor blood pressure at  home.  - Continue DASH diet.  Reminder to go to the ER if any CP,  SOB, nausea, dizziness, severe HA, changes vision/speech.   - CBC with Differential/Platelet - COMPLETE METABOLIC PANEL WITH GFR - Magnesium - TSH  2. Hyperlipidemia, mixed  - Continue diet/meds, exercise,& lifestyle modifications.  - Continue monitor periodic cholesterol/liver & renal functions    - Lipid panel - TSH  3. Abnormal glucose  - Continue diet, exercise  - Lifestyle modifications.  - Monitor appropriate labs   - Hemoglobin A1c - Insulin, random  4. Vitamin D deficiency  - Continue supplementation.   - VITAMIN D 25 Hydroxy   5. Stage 3b chronic kidney disease (HCC)  - COMPLETE METABOLIC PANEL WITH GFR  6. Aortic atherosclerosis ([place]) by CT scan 06/04/2019  - Lipid panel  7. Medication management  - CBC with Differential/Platelet - COMPLETE METABOLIC PANEL WITH GFR - Magnesium - Lipid panel - TSH - Hemoglobin A1c - Insulin, random - VITAMIN D 25 Hydroxy          Discussed  regular exercise, BP monitoring, weight control to achieve/maintain BMI less than 25 and discussed med and SE's. Recommended labs to assess and monitor clinical status with further disposition pending results of labs.  I discussed the assessment and treatment plan with the patient. The patient was provided an opportunity to ask questions and all were answered. The patient agreed with the  plan and demonstrated an understanding of the instructions.  I provided over 30 minutes of exam, counseling, chart review and  complex critical decision making.        The patient was advised to call back or seek an in-person evaluation if the symptoms worsen or if the condition fails to improve as anticipated.   Kirtland Bouchard, MD .

## 2021-09-26 NOTE — Patient Instructions (Signed)

## 2021-09-27 ENCOUNTER — Encounter: Payer: Self-pay | Admitting: Internal Medicine

## 2021-09-27 ENCOUNTER — Ambulatory Visit (INDEPENDENT_AMBULATORY_CARE_PROVIDER_SITE_OTHER): Payer: Medicare HMO | Admitting: Internal Medicine

## 2021-09-27 VITALS — BP 128/60 | HR 60 | Temp 97.8°F | Resp 17 | Ht 63.5 in | Wt 127.6 lb

## 2021-09-27 DIAGNOSIS — Z79899 Other long term (current) drug therapy: Secondary | ICD-10-CM

## 2021-09-27 DIAGNOSIS — N1832 Chronic kidney disease, stage 3b: Secondary | ICD-10-CM

## 2021-09-27 DIAGNOSIS — E559 Vitamin D deficiency, unspecified: Secondary | ICD-10-CM | POA: Diagnosis not present

## 2021-09-27 DIAGNOSIS — I10 Essential (primary) hypertension: Secondary | ICD-10-CM | POA: Diagnosis not present

## 2021-09-27 DIAGNOSIS — I700 Atherosclerosis of aorta: Secondary | ICD-10-CM | POA: Diagnosis not present

## 2021-09-27 DIAGNOSIS — R7309 Other abnormal glucose: Secondary | ICD-10-CM

## 2021-09-27 DIAGNOSIS — E782 Mixed hyperlipidemia: Secondary | ICD-10-CM | POA: Diagnosis not present

## 2021-09-27 MED ORDER — OLMESARTAN MEDOXOMIL-HCTZ 20-12.5 MG PO TABS: ORAL_TABLET | ORAL | 3 refills | Status: DC

## 2021-09-28 LAB — COMPLETE METABOLIC PANEL WITH GFR
AG Ratio: 1.8 (calc) (ref 1.0–2.5)
ALT: 10 U/L (ref 6–29)
AST: 19 U/L (ref 10–35)
Albumin: 4.2 g/dL (ref 3.6–5.1)
Alkaline phosphatase (APISO): 72 U/L (ref 37–153)
BUN/Creatinine Ratio: 18 (calc) (ref 6–22)
BUN: 40 mg/dL — ABNORMAL HIGH (ref 7–25)
CO2: 27 mmol/L (ref 20–32)
Calcium: 9.9 mg/dL (ref 8.6–10.4)
Chloride: 103 mmol/L (ref 98–110)
Creat: 2.21 mg/dL — ABNORMAL HIGH (ref 0.60–0.95)
Globulin: 2.4 g/dL (calc) (ref 1.9–3.7)
Glucose, Bld: 123 mg/dL — ABNORMAL HIGH (ref 65–99)
Potassium: 4.2 mmol/L (ref 3.5–5.3)
Sodium: 139 mmol/L (ref 135–146)
Total Bilirubin: 0.3 mg/dL (ref 0.2–1.2)
Total Protein: 6.6 g/dL (ref 6.1–8.1)
eGFR: 22 mL/min/{1.73_m2} — ABNORMAL LOW (ref >=60)

## 2021-09-28 LAB — CBC WITH DIFFERENTIAL/PLATELET
Absolute Monocytes: 429 cells/uL (ref 200–950)
Basophils Absolute: 29 cells/uL (ref 0–200)
Basophils Relative: 0.5 %
Eosinophils Absolute: 162 cells/uL (ref 15–500)
Eosinophils Relative: 2.8 %
HCT: 32.2 % — ABNORMAL LOW (ref 35.0–45.0)
Hemoglobin: 10.5 g/dL — ABNORMAL LOW (ref 11.7–15.5)
Lymphs Abs: 1647 cells/uL (ref 850–3900)
MCH: 30.3 pg (ref 27.0–33.0)
MCHC: 32.6 g/dL (ref 32.0–36.0)
MCV: 93.1 fL (ref 80.0–100.0)
MPV: 11.4 fL (ref 7.5–12.5)
Monocytes Relative: 7.4 %
Neutro Abs: 3532 cells/uL (ref 1500–7800)
Neutrophils Relative %: 60.9 %
Platelets: 192 10*3/uL (ref 140–400)
RBC: 3.46 10*6/uL — ABNORMAL LOW (ref 3.80–5.10)
RDW: 12.2 % (ref 11.0–15.0)
Total Lymphocyte: 28.4 %
WBC: 5.8 10*3/uL (ref 3.8–10.8)

## 2021-09-28 LAB — INSULIN, RANDOM: Insulin: 18.7 u[IU]/mL — ABNORMAL HIGH

## 2021-09-28 LAB — LIPID PANEL
Cholesterol: 179 mg/dL (ref <200)
HDL: 59 mg/dL (ref >=50)
LDL Cholesterol (Calc): 102 mg/dL (calc) — ABNORMAL HIGH
Non-HDL Cholesterol (Calc): 120 mg/dL (calc) (ref <130)
Total CHOL/HDL Ratio: 3 (calc) (ref <5.0)
Triglycerides: 89 mg/dL (ref <150)

## 2021-09-28 LAB — VITAMIN D 25 HYDROXY (VIT D DEFICIENCY, FRACTURES): Vit D, 25-Hydroxy: 95 ng/mL (ref 30–100)

## 2021-09-28 LAB — TSH: TSH: 1.15 mIU/L (ref 0.40–4.50)

## 2021-09-28 LAB — HEMOGLOBIN A1C
Hgb A1c MFr Bld: 5.6 % of total Hgb (ref <5.7)
Mean Plasma Glucose: 114 mg/dL
eAG (mmol/L): 6.3 mmol/L

## 2021-09-28 LAB — MAGNESIUM: Magnesium: 2.3 mg/dL (ref 1.5–2.5)

## 2021-10-11 DIAGNOSIS — N184 Chronic kidney disease, stage 4 (severe): Secondary | ICD-10-CM | POA: Diagnosis not present

## 2021-11-09 ENCOUNTER — Encounter: Payer: Self-pay | Admitting: Internal Medicine

## 2021-12-13 IMAGING — DX DG LUMBAR SPINE COMPLETE 4+V
5 series · 5 of 5 positions shown · non-contrast
Comparison: None.

CLINICAL DATA: Right-sided low back pain for 1 week. Lifting
injury.

EXAM:
LUMBAR SPINE - COMPLETE 4+ VIEW

[lumbar spine ap]
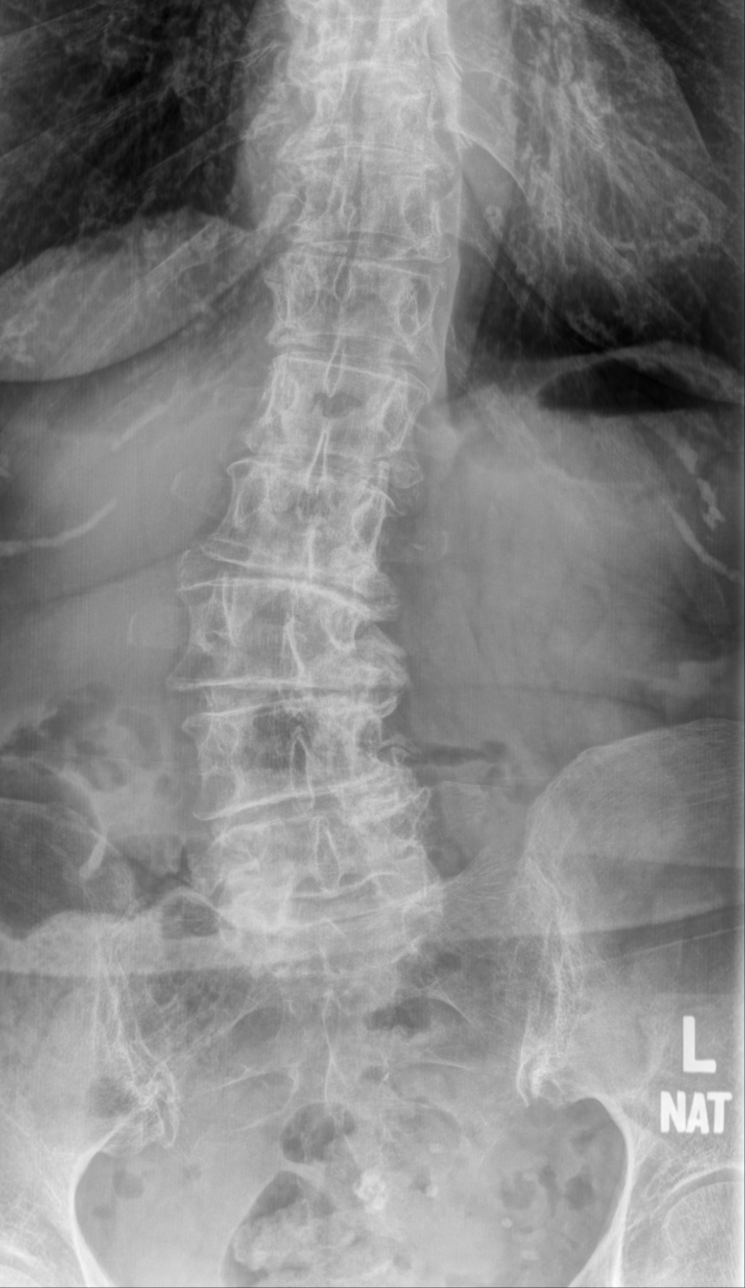

[lumbar spine oblique standing (1 of 2)]
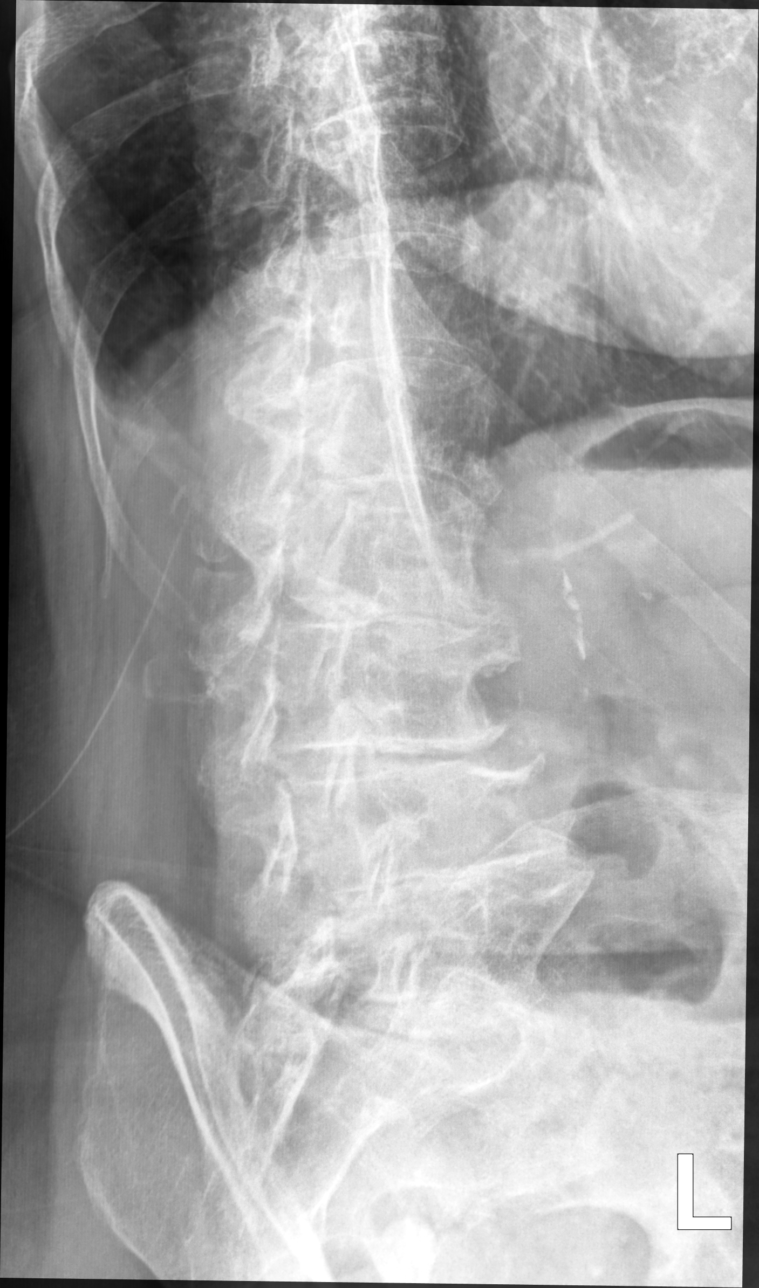

[lumbar spine oblique standing (2 of 2)]
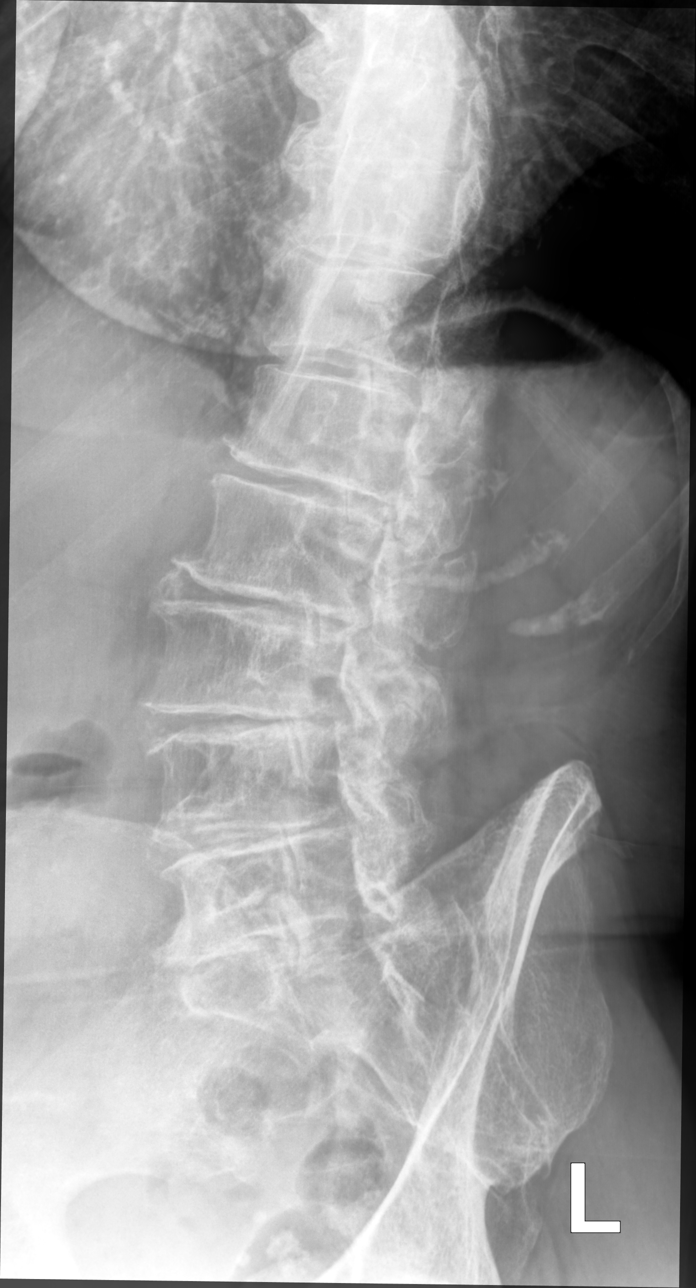

[lumbar spine lat (1 of 2)]
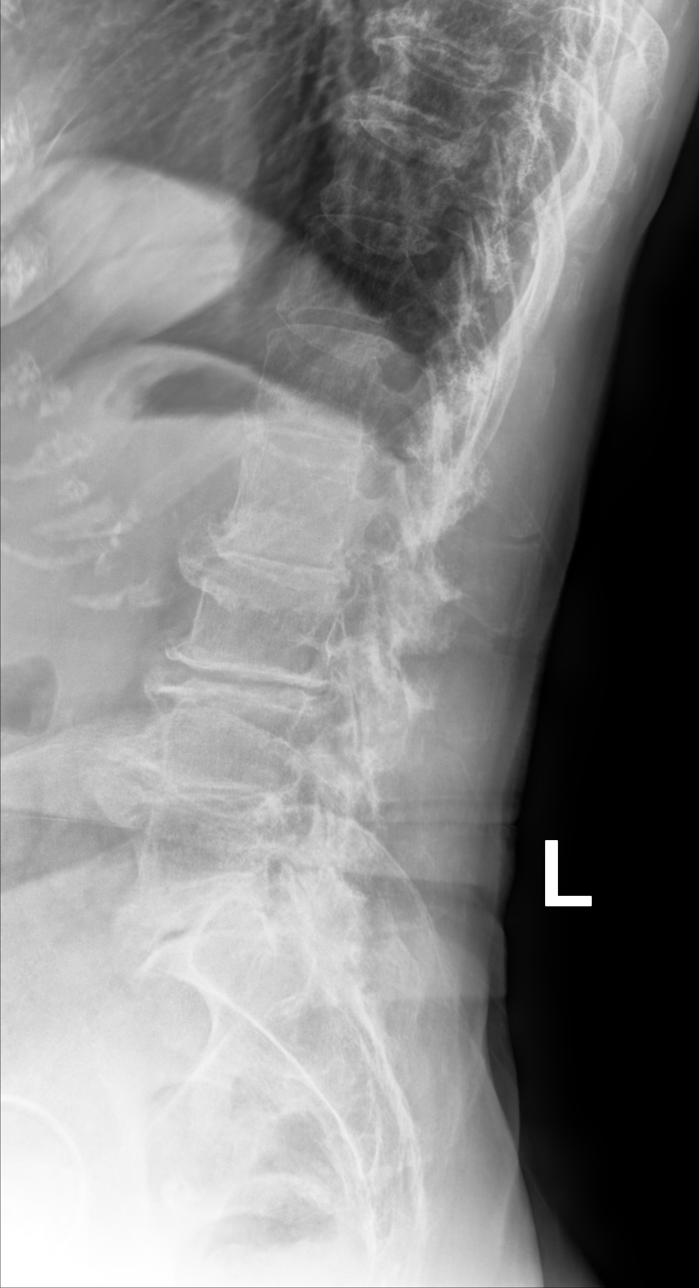

[lumbar spine lat (2 of 2)]
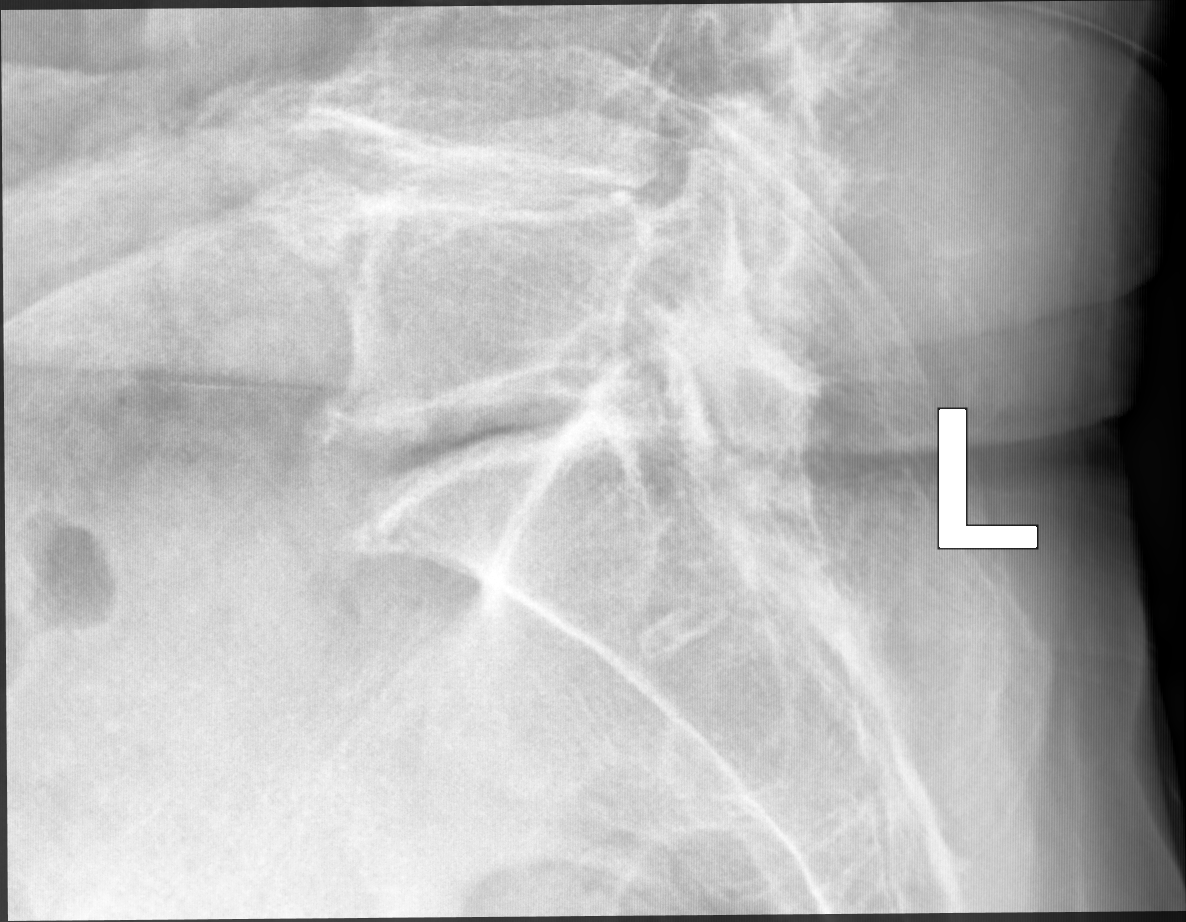

[5 of 5 positions shown; findings below may reference images not displayed]

FINDINGS: There is no evidence of lumbar spine fracture. Alignment is normal.
Severe degenerative disc disease is seen at all lumbar levels.
Moderate facet DJD also seen bilaterally at most lumbar levels.
Mild-to-moderate degenerative dextroscoliosis noted. No focal lytic
or sclerotic bone lesions identified.
IMPRESSION: No acute findings.

Severe degenerative spondylosis, as described above.

Mild-to-moderate degenerative dextroscoliosis.

## 2021-12-21 DIAGNOSIS — E785 Hyperlipidemia, unspecified: Secondary | ICD-10-CM | POA: Diagnosis not present

## 2021-12-21 DIAGNOSIS — I129 Hypertensive chronic kidney disease with stage 1 through stage 4 chronic kidney disease, or unspecified chronic kidney disease: Secondary | ICD-10-CM | POA: Diagnosis not present

## 2021-12-21 DIAGNOSIS — N2581 Secondary hyperparathyroidism of renal origin: Secondary | ICD-10-CM | POA: Diagnosis not present

## 2021-12-21 DIAGNOSIS — D631 Anemia in chronic kidney disease: Secondary | ICD-10-CM | POA: Diagnosis not present

## 2021-12-21 DIAGNOSIS — N184 Chronic kidney disease, stage 4 (severe): Secondary | ICD-10-CM | POA: Diagnosis not present

## 2022-01-01 ENCOUNTER — Encounter: Payer: Self-pay | Admitting: Internal Medicine

## 2022-02-07 ENCOUNTER — Other Ambulatory Visit: Payer: Self-pay | Admitting: Nurse Practitioner

## 2022-02-18 NOTE — Progress Notes (Incomplete)
Annual Screening/Preventative Visit & Comprehensive Evaluation &  Examination   Future Appointments  Date Time Provider Department  02/19/2022                   cpe  2:00 PM Unk Pinto, MD [facility]  06/04/2022                 wellness  9:00 AM Darrol Jump, NP [facility]  02/25/2023                  cpe  2:00 PM Unk Pinto, MD [facility]        This very nice 82 y.o. WWF presents for a Screening /Preventative Visit & comprehensive evaluation and management of multiple medical co-morbidities.  Patient has been followed for HTN, HLD, Prediabetes  and Vitamin D Deficiency. In 2021,  CT Scan showed Aortic Atherosclerosis.         HTN predates circa2005  and patient has CKD3b (GFR 33) attributed to her HTVD. Patient's BP has been controlled at home and patient denies any cardiac symptoms as chest pain, palpitations, shortness of breath, dizziness or ankle swelling. Today's BP is at goal -                            .         Patient's hyperlipidemia is near controlled with diet & Pravastatin. Patient denies myalgias or other medication SE's. Last lipids were near goal :  Lab Results  Component Value Date   CHOL 179 09/27/2021   HDL 59 09/27/2021   LDLCALC 102 (H) 09/27/2021   TRIG 89 09/27/2021   CHOLHDL 3.0 09/27/2021         Patient has hx/o prediabetes predating (A1c 5.8% /2013 &  6.0% /2016) and patient denies reactive hypoglycemic symptoms, visual blurring, diabetic polys or paresthesias. Last A1c was at  goal :  Lab Results  Component Value Date   HGBA1C 5.6 09/27/2021         Finally, patient has history of Vitamin D Deficiency and last Vitamin D was near goal (70-100) :  Lab Results  Component Value Date   VD25OH 95 09/27/2021      Current Outpatient Medications  Medication Instructions  . VITAMIN C 500 MG  1 capsule Not on a regular basis   . Aspirin   81 mg  Daily  . VITAMIN D  5,000 Units Daily  . Magnesium 400 MG TABS 1 tablet Daily  .  Multiple Vitamin  1 tablet Daily  . olmesartan-hctz  20-12.5 MG tablet Take  1 tablet  Daily   . OTC Iron tablet Takes daily.   . pravastatin 40 MG tablet TAKE 1 TABLET  AT BEDTIME   . sertraline 50 MG tablet TAKE 1 TABLET DAILY   . traZODone 150 MG tablet TAKE 1/3-1/2-1 TABLET 1 TO 2 HRS BEFORE BEDTIME   . zinc   50 mg ,Daily     Allergies  Allergen Reactions  . Prednisone Other (See Comments)    HIGH DOSE CAUSES INSOMNIA    Past Medical History:  Diagnosis Date  . Hyperlipidemia   . Hypertension   . abnormal glucose   . vitamin D deficiency   . Wrist fracture, right     Health Maintenance  Topic Date Due  . Zoster Vaccines- Shingrix (1 of 2) Never done  . Pneumonia Vaccine 69+ Years old (2 - PPSV23 if available,  else PCV20) 06/08/2015  . COVID-19 Vaccine  04/22/2019  . INFLUENZA VACCINE  08/15/2020  . TETANUS/TDAP  04/03/2021  . DEXA SCAN  Completed  . HPV VACCINES  Aged Out    Immunization History  Administered Date(s) Administered  . Fluad Quad(high Dose ) 10/02/2018  . Influenza Nasal 11/06/2011  . Influenza, High Dose  10/06/2013, 09/23/2014, 10/30/2017, 10/08/2019  . Influenza 10/24/2012, 10/15/2016  . PFIZER SARS-COV-2 Vacc  02/28/2019, 03/25/2019  . Pneumococcal -13 06/08/2014  . Pneumococcal-23 02/07/2006  . Tdap 04/04/2011  . Zoster, Live 01/15/2009    Cologard - 07/29/2018 - Negative - Recc 3 yr f/u due July 2023   Last MGM & BMD  -  09/25/2018    Past Surgical History:  Procedure Laterality Date  . EYE SURGERY Bilateral 2012     Family History  Problem Relation Age of Onset  . Diabetes Mother   . Heart disease Mother   . Hyperlipidemia Mother   . Hypertension Mother   . Cancer Father        PROSTATE  . Diabetes Brother   . Diabetes Brother   . Cancer Brother        LUNG  . Cancer Brother        BONE     Social History   Tobacco Use  . Smoking status: Never    Passive exposure: Yes  . Smokeless tobacco: Never  . Tobacco  comments:    Second hand smoke exposure  Substance Use Topics  . Alcohol use: No  . Drug use: No      ROS Constitutional: Denies fever, chills, weight loss/gain, headaches, insomnia,  night sweats, and change in appetite. Does c/o fatigue. Eyes: Denies redness, blurred vision, diplopia, discharge, itchy, watery eyes.  ENT: Denies discharge, congestion, post nasal drip, epistaxis, sore throat, earache, hearing loss, dental pain, Tinnitus, Vertigo, Sinus pain, snoring.  Cardio: Denies chest pain, palpitations, irregular heartbeat, syncope, dyspnea, diaphoresis, orthopnea, PND, claudication, edema Respiratory: denies cough, dyspnea, DOE, pleurisy, hoarseness, laryngitis, wheezing.  Gastrointestinal: Denies dysphagia, heartburn, reflux, water brash, pain, cramps, nausea, vomiting, bloating, diarrhea, constipation, hematemesis, melena, hematochezia, jaundice, hemorrhoids Genitourinary: Denies dysuria, frequency, urgency, nocturia, hesitancy, discharge, hematuria, flank pain Breast: Breast lumps, nipple discharge, bleeding.  Musculoskeletal: Denies arthralgia, myalgia, stiffness, Jt. Swelling, pain, limp, and strain/sprain. Denies falls. Skin: Denies puritis, rash, hives, warts, acne, eczema, changing in skin lesion Neuro: No weakness, tremor, incoordination, spasms, paresthesia, pain Psychiatric: Denies confusion, memory loss, sensory loss. Denies Depression. Endocrine: Denies change in weight, skin, hair change, nocturia, and paresthesia, diabetic polys, visual blurring, hyper / hypo glycemic episodes.  Heme/Lymph: No excessive bleeding, bruising, enlarged lymph nodes.  Physical Exam  There were no vitals taken for this visit.  General Appearance: Well nourished, well groomed and in no apparent distress.  Eyes: PERRLA, EOMs, conjunctiva no swelling or erythema, normal fundi and vessels. Sinuses: No frontal/maxillary tenderness ENT/Mouth: EACs patent / TMs  nl. Nares clear without  erythema, swelling, mucoid exudates. Oral hygiene is good. No erythema, swelling, or exudate. Tongue normal, non-obstructing. Tonsils not swollen or erythematous. Hearing normal.  Neck: Supple, thyroid not palpable. No bruits, nodes or JVD. Respiratory: Respiratory effort normal.  BS equal and clear bilateral without rales, rhonci, wheezing or stridor. Cardio: Heart sounds are normal with regular rate and rhythm and no murmurs, rubs or gallops. Peripheral pulses are normal and equal bilaterally without edema. No aortic or femoral bruits. Chest: symmetric with normal excursions and percussion. Breasts: Symmetric, without lumps, nipple  discharge, retractions, or fibrocystic changes.  Abdomen: Flat, soft with bowel sounds active. Nontender, no guarding, rebound, hernias, masses, or organomegaly.  Lymphatics: Non tender without lymphadenopathy.  Musculoskeletal: Full ROM all peripheral extremities, joint stability, 5/5 strength, and normal gait. Skin: Warm and dry without rashes, lesions, cyanosis, clubbing or  ecchymosis.  Neuro: Cranial nerves intact, reflexes equal bilaterally. Normal muscle tone, no cerebellar symptoms. Sensation intact.  Pysch: Alert and oriented X 3, normal affect, Insight and Judgment appropriate.    Assessment and Plan  1. Annual Preventative Screening Examination   2. Essential hypertension  - EKG 12-Lead - Urinalysis, Routine w reflex microscopic - Microalbumin / creatinine urine ratio - CBC with Differential/Platelet - COMPLETE METABOLIC PANEL WITH GFR - Magnesium - TSH  3. Hyperlipidemia, mixed  - EKG 12-Lead - Lipid panel - TSH  4. Abnormal glucose  - EKG 12-Lead - Hemoglobin A1c - Insulin, random  5. 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 Aortic atherosclerosis ([place]) by CT scan  06/04/2019  - EKG 12-Lead - Lipid panel  8. Screening for colorectal cancer  - POC Hemoccult Bld/Stl   9. Screening for ischemic heart disease  - EKG 12-Lead  10. FHx: heart disease  - EKG 12-Lead  11. Medication management  - Urinalysis, Routine w reflex microscopic - Microalbumin / creatinine urine ratio - CBC with Differential/Platelet - COMPLETE METABOLIC PANEL WITH GFR - Magnesium - Lipid panel - TSH - Hemoglobin A1c - Insulin, random - VITAMIN D 25 Hydroxy           Patient was counseled in prudent diet to achieve/maintain BMI less than 25 for weight control, BP monitoring, regular exercise and medications. Discussed med's effects and SE's. Screening labs and tests as requested with regular follow-up as recommended. Over 40 minutes of exam, counseling, chart review and high complex critical decision making was performed.   Kirtland Bouchard, MD

## 2022-02-18 NOTE — Progress Notes (Signed)
Annual Screening/Preventative Visit & Comprehensive Evaluation &  Examination   Future Appointments  Date Time Provider Department  02/19/2022                   cpe  2:00 PM Unk Pinto, MD [facility]  06/04/2022                 wellness  9:00 AM Darrol Jump, NP [facility]  02/25/2023                  cpe  2:00 PM Unk Pinto, MD [facility]        This very nice 82 y.o. WWF presents for a Screening /Preventative Visit & comprehensive evaluation and management of multiple medical co-morbidities.  Patient has been followed for HTN, HLD, Prediabetes  and Vitamin D Deficiency.  CT Scan in 2021 showed Aortic Atherosclerosis.         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 Patient is followed by Dr Gean Quint [facility]. Patient's BP has been controlled at home and patient denies any cardiac symptoms as chest pain, palpitations, shortness of breath, dizziness or ankle swelling. Today's BP is at goal - 130/60.         Patient's hyperlipidemia is near controlled with diet & Pravastatin. Patient denies myalgias or other medication SE's. Last lipids were near goal :  Lab Results  Component Value Date   CHOL 179 09/27/2021   HDL 59 09/27/2021   LDLCALC 102 (H) 09/27/2021   TRIG 89 09/27/2021   CHOLHDL 3.0 09/27/2021         Patient has hx/o prediabetes predating (A1c 5.8% /2013 &  6.0% /2016) and patient denies reactive hypoglycemic symptoms, visual blurring, diabetic polys or paresthesias. Last A1c was at  goal :  Lab Results  Component Value Date   HGBA1C 5.6 09/27/2021         Finally, patient has history of Vitamin D Deficiency and last Vitamin D was near goal (70-100) :  Lab Results  Component Value Date   VD25OH 95 09/27/2021      Current Outpatient Medications  Medication Instructions   VITAMIN C 500 MG  1 capsule Not on a regular basis    Aspirin   81 mg  Daily   VITAMIN D  5,000  Units Daily   Magnesium 400 MG TABS 1 tablet Daily   Multiple Vitamin  1 tablet Daily   olmesartan-hctz  20-12.5 MG tablet Take  1 tablet  Daily    OTC Iron tablet Takes daily.    pravastatin 40 MG tablet TAKE 1 TABLET  AT BEDTIME    sertraline 50 MG tablet TAKE 1 TABLET DAILY    traZODone 150 MG tablet TAKE 1/3-1/2-1 TABLET 1 TO 2 HRS BEFORE BEDTIME    zinc   50 mg Daily     Allergies  Allergen Reactions   Prednisone Other (See Comments)    HIGH DOSE CAUSES INSOMNIA    Past Medical History:  Diagnosis Date   Hyperlipidemia    Hypertension    abnormal glucose    vitamin D deficiency    Wrist fracture, right     Health Maintenance  Topic Date Due   Zoster Vaccines- Shingrix (1 of 2) Never done   Pneumonia Vaccine 80+ Years old (2 - PPSV23 if available, else PCV20) 06/08/2015   COVID-19 Vaccine  04/22/2019   INFLUENZA VACCINE  08/15/2020   TETANUS/TDAP  04/03/2021   DEXA SCAN  Completed   HPV VACCINES  Aged Out    Immunization History  Administered Date(s) Administered   Fluad Quad(high Dose ) 10/02/2018   Influenza Nasal 11/06/2011   Influenza, High Dose  10/06/2013, 09/23/2014, 10/30/2017, 10/08/2019   Influenza 10/24/2012, 10/15/2016   PFIZER SARS-COV-2 Vacc  02/28/2019, 03/25/2019   Pneumococcal -13 06/08/2014   Pneumococcal-23 02/07/2006   Tdap 04/04/2011   Zoster, Live 01/15/2009    Cologard - 07/29/2018 - Negative - Recc 3 yr f/u due July 2023 - Overdue   Last MGM & BMD  -  09/25/2018 - Overdue - patient aware    Past Surgical History:  Procedure Laterality Date   EYE SURGERY Bilateral 04-23-2010     Family History  Problem Relation Age of Onset   Diabetes Mother    Heart disease Mother    Hyperlipidemia Mother    Hypertension Mother    Cancer Father        PROSTATE   Diabetes Brother    Diabetes Brother    Cancer Brother        LUNG   Cancer Brother        BONE   Patient has a daughter that died in 04/22/20 with Lung Cancer     Social  History   Tobacco Use   Smoking status: Never    Passive exposure: Yes   Smokeless tobacco: Never   Tobacco comments:    Second hand smoke exposure  Substance Use Topics   Alcohol use: No   Drug use: No      ROS Constitutional: Denies fever, chills, weight loss/gain, headaches, insomnia,  night sweats, and change in appetite. Does c/o fatigue. Eyes: Denies redness, blurred vision, diplopia, discharge, itchy, watery eyes.  ENT: Denies discharge, congestion, post nasal drip, epistaxis, sore throat, earache, hearing loss, dental pain, Tinnitus, Vertigo, Sinus pain, snoring.  Cardio: Denies chest pain, palpitations, irregular heartbeat, syncope, dyspnea, diaphoresis, orthopnea, PND, claudication, edema Respiratory: denies cough, dyspnea, DOE, pleurisy, hoarseness, laryngitis, wheezing.  Gastrointestinal: Denies dysphagia, heartburn, reflux, water brash, pain, cramps, nausea, vomiting, bloating, diarrhea, constipation, hematemesis, melena, hematochezia, jaundice, hemorrhoids Genitourinary: Denies dysuria, frequency, urgency, nocturia, hesitancy, discharge, hematuria, flank pain Breast: Breast lumps, nipple discharge, bleeding.  Musculoskeletal: Denies arthralgia, myalgia, stiffness, Jt. Swelling, pain, limp, and strain/sprain. Denies falls. Skin: Denies puritis, rash, hives, warts, acne, eczema, changing in skin lesion Neuro: No weakness, tremor, incoordination, spasms, paresthesia, pain Psychiatric: Denies confusion, memory loss, sensory loss. Denies Depression. Endocrine: Denies change in weight, skin, hair change, nocturia, and paresthesia, diabetic polys, visual blurring, hyper / hypo glycemic episodes.  Heme/Lymph: No excessive bleeding, bruising, enlarged lymph nodes.  Physical Exam  BP 130/60   Pulse 68   Temp 97.9 F (36.6 C)   Ht '5\' 3"'$  (1.6 m)   Wt 128 lb (58.1 kg)   SpO2 98%   BMI 22.67 kg/m   General Appearance: Well nourished, well groomed and in no apparent  distress.  Eyes: PERRLA, EOMs, conjunctiva no swelling or erythema, normal fundi and vessels. Sinuses: No frontal/maxillary tenderness ENT/Mouth: EACs patent / TMs  nl. Nares clear without erythema, swelling, mucoid exudates. Oral hygiene is good. No erythema, swelling, or exudate. Tongue normal, non-obstructing. Tonsils not swollen or erythematous. Hearing normal.  Neck: Supple, thyroid not palpable. No bruits, nodes or JVD. Respiratory: Respiratory effort normal.  BS equal and clear bilateral without rales, rhonci, wheezing or stridor. Cardio: Heart sounds are normal with regular  rate and rhythm and no murmurs, rubs or gallops. Peripheral pulses are normal and equal bilaterally without edema. No aortic or femoral bruits. Chest: symmetric with normal excursions and percussion. Breasts: Deferred to [facility] which patient agreed to schedule .  Abdomen: Flat, soft with bowel sounds active. Nontender, no guarding, rebound, hernias, masses, or organomegaly.  Lymphatics: Non tender without lymphadenopathy.  Musculoskeletal: Full ROM all peripheral extremities, joint stability, 5/5 strength, and normal gait. Skin: Warm and dry without rashes, lesions, cyanosis, clubbing or  ecchymosis.  Neuro: Cranial nerves intact, reflexes equal bilaterally. Normal muscle tone, no cerebellar symptoms. Sensation intact.  Pysch: Alert and oriented X 3, normal affect, Insight and Judgment appropriate.    Assessment and Plan  1. Annual Preventative Screening Examination   2. Essential hypertension  - EKG 12-Lead - Urinalysis, Routine w reflex microscopic - Microalbumin / creatinine urine ratio - CBC with Differential/Platelet - COMPLETE METABOLIC PANEL WITH GFR - Magnesium - TSH  3. Hyperlipidemia, mixed  - EKG 12-Lead - Lipid panel - TSH  4. Abnormal glucose  - EKG 12-Lead - Hemoglobin A1c - Insulin, random  5. Vitamin D deficiency  - VITAMIN D 25 Hydroxy  6. 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 Aortic atherosclerosis ([place]) by CT scan 06/04/2019  - EKG 12-Lead - Lipid panel  8. Screening for colorectal cancer  - POC Hemoccult Bld/Stl   9. Screening for ischemic heart disease  - EKG 12-Lead  10. FHx: heart disease  - EKG 12-Lead  11. Medication management  - Urinalysis, Routine w reflex microscopic - Microalbumin / creatinine urine ratio - CBC with Differential/Platelet - COMPLETE METABOLIC PANEL WITH GFR - Magnesium - Lipid panel - TSH - Hemoglobin A1c - Insulin, random - VITAMIN D 25 Hydroxy           Patient was counseled in prudent diet to achieve/maintain BMI less than 25 for weight control, BP monitoring, regular exercise and medications. Discussed med's effects and SE's. Screening labs and tests as requested with regular follow-up as recommended. Over 40 minutes of exam, counseling, chart review and high complex critical decision making was performed.   Kirtland Bouchard, MD

## 2022-02-19 ENCOUNTER — Encounter: Payer: Self-pay | Admitting: Internal Medicine

## 2022-02-19 ENCOUNTER — Ambulatory Visit (INDEPENDENT_AMBULATORY_CARE_PROVIDER_SITE_OTHER): Payer: Medicare Other | Admitting: Internal Medicine

## 2022-02-19 VITALS — BP 130/60 | HR 68 | Temp 97.9°F | Ht 63.0 in | Wt 128.0 lb

## 2022-02-19 DIAGNOSIS — N184 Chronic kidney disease, stage 4 (severe): Secondary | ICD-10-CM | POA: Diagnosis not present

## 2022-02-19 DIAGNOSIS — E559 Vitamin D deficiency, unspecified: Secondary | ICD-10-CM | POA: Diagnosis not present

## 2022-02-19 DIAGNOSIS — I129 Hypertensive chronic kidney disease with stage 1 through stage 4 chronic kidney disease, or unspecified chronic kidney disease: Secondary | ICD-10-CM

## 2022-02-19 DIAGNOSIS — Z79899 Other long term (current) drug therapy: Secondary | ICD-10-CM

## 2022-02-19 DIAGNOSIS — R7309 Other abnormal glucose: Secondary | ICD-10-CM | POA: Diagnosis not present

## 2022-02-19 DIAGNOSIS — Z0001 Encounter for general adult medical examination with abnormal findings: Secondary | ICD-10-CM

## 2022-02-19 DIAGNOSIS — Z136 Encounter for screening for cardiovascular disorders: Secondary | ICD-10-CM | POA: Diagnosis not present

## 2022-02-19 DIAGNOSIS — Z1211 Encounter for screening for malignant neoplasm of colon: Secondary | ICD-10-CM

## 2022-02-19 DIAGNOSIS — I10 Essential (primary) hypertension: Secondary | ICD-10-CM

## 2022-02-19 DIAGNOSIS — Z8249 Family history of ischemic heart disease and other diseases of the circulatory system: Secondary | ICD-10-CM

## 2022-02-19 DIAGNOSIS — I700 Atherosclerosis of aorta: Secondary | ICD-10-CM

## 2022-02-19 DIAGNOSIS — Z0000 Encounter for general adult medical examination without abnormal findings: Secondary | ICD-10-CM | POA: Diagnosis not present

## 2022-02-19 DIAGNOSIS — E782 Mixed hyperlipidemia: Secondary | ICD-10-CM

## 2022-02-19 MED ORDER — PRAVASTATIN SODIUM 20 MG PO TABS: ORAL_TABLET | ORAL | 3 refills | Status: DC

## 2022-02-19 NOTE — Patient Instructions (Signed)

## 2022-02-20 ENCOUNTER — Other Ambulatory Visit: Payer: Self-pay | Admitting: Internal Medicine

## 2022-02-20 DIAGNOSIS — E782 Mixed hyperlipidemia: Secondary | ICD-10-CM

## 2022-02-20 LAB — HEMOGLOBIN A1C
Hgb A1c MFr Bld: 6 % of total Hgb — ABNORMAL HIGH (ref <5.7)
Mean Plasma Glucose: 126 mg/dL
eAG (mmol/L): 7 mmol/L

## 2022-02-20 LAB — COMPLETE METABOLIC PANEL WITH GFR
AG Ratio: 1.6 (calc) (ref 1.0–2.5)
ALT: 14 U/L (ref 6–29)
AST: 20 U/L (ref 10–35)
Albumin: 4.4 g/dL (ref 3.6–5.1)
Alkaline phosphatase (APISO): 69 U/L (ref 37–153)
BUN/Creatinine Ratio: 18 (calc) (ref 6–22)
BUN: 35 mg/dL — ABNORMAL HIGH (ref 7–25)
CO2: 31 mmol/L (ref 20–32)
Calcium: 10 mg/dL (ref 8.6–10.4)
Chloride: 100 mmol/L (ref 98–110)
Creat: 1.9 mg/dL — ABNORMAL HIGH (ref 0.60–0.95)
Globulin: 2.7 g/dL (calc) (ref 1.9–3.7)
Glucose, Bld: 89 mg/dL (ref 65–99)
Potassium: 4.2 mmol/L (ref 3.5–5.3)
Sodium: 140 mmol/L (ref 135–146)
Total Bilirubin: 0.4 mg/dL (ref 0.2–1.2)
Total Protein: 7.1 g/dL (ref 6.1–8.1)
eGFR: 26 mL/min/{1.73_m2} — ABNORMAL LOW (ref >=60)

## 2022-02-20 LAB — LIPID PANEL
Cholesterol: 211 mg/dL — ABNORMAL HIGH (ref <200)
HDL: 65 mg/dL (ref >=50)
LDL Cholesterol (Calc): 125 mg/dL (calc) — ABNORMAL HIGH
Non-HDL Cholesterol (Calc): 146 mg/dL (calc) — ABNORMAL HIGH (ref <130)
Total CHOL/HDL Ratio: 3.2 (calc) (ref <5.0)
Triglycerides: 107 mg/dL (ref <150)

## 2022-02-20 LAB — CBC WITH DIFFERENTIAL/PLATELET
Absolute Monocytes: 447 cells/uL (ref 200–950)
Basophils Absolute: 38 cells/uL (ref 0–200)
Basophils Relative: 0.6 %
Eosinophils Absolute: 176 cells/uL (ref 15–500)
Eosinophils Relative: 2.8 %
HCT: 36.2 % (ref 35.0–45.0)
Hemoglobin: 12.1 g/dL (ref 11.7–15.5)
Lymphs Abs: 1751 cells/uL (ref 850–3900)
MCH: 29.8 pg (ref 27.0–33.0)
MCHC: 33.4 g/dL (ref 32.0–36.0)
MCV: 89.2 fL (ref 80.0–100.0)
MPV: 11.2 fL (ref 7.5–12.5)
Monocytes Relative: 7.1 %
Neutro Abs: 3887 cells/uL (ref 1500–7800)
Neutrophils Relative %: 61.7 %
Platelets: 217 10*3/uL (ref 140–400)
RBC: 4.06 10*6/uL (ref 3.80–5.10)
RDW: 12 % (ref 11.0–15.0)
Total Lymphocyte: 27.8 %
WBC: 6.3 10*3/uL (ref 3.8–10.8)

## 2022-02-20 LAB — URINALYSIS, ROUTINE W REFLEX MICROSCOPIC
Bilirubin Urine: NEGATIVE
Glucose, UA: NEGATIVE
Hgb urine dipstick: NEGATIVE
Ketones, ur: NEGATIVE
Nitrite: NEGATIVE
Protein, ur: NEGATIVE
Specific Gravity, Urine: 1.014 (ref 1.001–1.035)
pH: 5.5 (ref 5.0–8.0)

## 2022-02-20 LAB — PTH, INTACT AND CALCIUM
Calcium: 10 mg/dL (ref 8.6–10.4)
PTH: 33 pg/mL (ref 16–77)

## 2022-02-20 LAB — TSH: TSH: 0.83 mIU/L (ref 0.40–4.50)

## 2022-02-20 LAB — MICROALBUMIN / CREATININE URINE RATIO
Creatinine, Urine: 117 mg/dL (ref 20–275)
Microalb Creat Ratio: 3 mcg/mg creat (ref <30)
Microalb, Ur: 0.4 mg/dL

## 2022-02-20 LAB — INSULIN, RANDOM: Insulin: 3.6 u[IU]/mL

## 2022-02-20 LAB — VITAMIN D 25 HYDROXY (VIT D DEFICIENCY, FRACTURES): Vit D, 25-Hydroxy: 73 ng/mL (ref 30–100)

## 2022-02-20 LAB — MICROSCOPIC MESSAGE

## 2022-02-20 LAB — MAGNESIUM: Magnesium: 2.4 mg/dL (ref 1.5–2.5)

## 2022-02-20 MED ORDER — EZETIMIBE 10 MG PO TABS: 10.0000 mg | ORAL_TABLET | Freq: Every day | ORAL | 1 refills | Status: DC

## 2022-02-20 MED ORDER — EZETIMIBE 10 MG PO TABS: ORAL_TABLET | ORAL | 3 refills | Status: DC

## 2022-02-20 NOTE — Progress Notes (Signed)
<><><><><><><><><><><><><><><><><><><><><><><><><><><><><><><><><> <><><><><><><><><><><><><><><><><><><><><><><><><><><><><><><><><>  -   Kidney Functions appear stable  over the last year                                                           - about the same - Still Stage 4  ( GFR = 26 )   <><><><><><><><><><><><><><><><><><><><><><><><><><><><><><><><><> <><><><><><><><><><><><><><><><><><><><><><><><><><><><><><><><><>  -  Total  Chol =  211   - Elevated             (  Ideal  or  Goal is less than 180  !  )  & -  Bad / Dangerous LDL  Chol = 125  - also Elevated              (  Ideal  or  Goal is less than 70  !  )   - WORSE - So sent in a new Rx for Zetia  (Ezetimibe) to   take with the Pravastatin to prevent a Heart Attack, Stroke or Dementia   <><><><><><><><><><><><><><><><><><><><><><><><><><><><><><><><><>  - A1c = 6.0%  - Shows sugar is up again   ( Normal non-Diabetic is less than 5.7%)   - So  . . . . . .  . Marland Kitchen  - Avoid Sweets, Candy & White Stuff   - White [place], [place], [place] Flour  - Breads &  Pasta <><><><><><><><><><><><><><><><><><><><><><><><><><><><><><><><><>  - Vitamin D = 73 - Excellent - So Please Keep dose same  <><><><><><><><><><><><><><><><><><><><><><><><><><><><><><><><><>  - All Else - CBC -  Electrolytes - Liver - Magnesium & Thyroid    - all  Normal / OK <><><><><><><><><><><><><><><><><><><><><><><><><><><><><><><><><> <><><><><><><><><><><><><><><><><><><><><><><><><><><><><><><><><>

## 2022-02-28 DIAGNOSIS — Z1211 Encounter for screening for malignant neoplasm of colon: Secondary | ICD-10-CM | POA: Diagnosis not present

## 2022-02-28 DIAGNOSIS — Z1212 Encounter for screening for malignant neoplasm of rectum: Secondary | ICD-10-CM | POA: Diagnosis not present

## 2022-03-11 LAB — COLOGUARD: COLOGUARD: NEGATIVE

## 2022-03-12 NOTE — Progress Notes (Signed)
Patient is aware of cologard results

## 2022-03-14 DIAGNOSIS — N184 Chronic kidney disease, stage 4 (severe): Secondary | ICD-10-CM | POA: Diagnosis not present

## 2022-03-22 DIAGNOSIS — N184 Chronic kidney disease, stage 4 (severe): Secondary | ICD-10-CM | POA: Diagnosis not present

## 2022-03-22 DIAGNOSIS — I129 Hypertensive chronic kidney disease with stage 1 through stage 4 chronic kidney disease, or unspecified chronic kidney disease: Secondary | ICD-10-CM | POA: Diagnosis not present

## 2022-03-22 DIAGNOSIS — D631 Anemia in chronic kidney disease: Secondary | ICD-10-CM | POA: Diagnosis not present

## 2022-03-22 DIAGNOSIS — E875 Hyperkalemia: Secondary | ICD-10-CM | POA: Diagnosis not present

## 2022-03-22 DIAGNOSIS — N2581 Secondary hyperparathyroidism of renal origin: Secondary | ICD-10-CM | POA: Diagnosis not present

## 2022-04-05 ENCOUNTER — Encounter: Payer: Self-pay | Admitting: Internal Medicine

## 2022-06-04 ENCOUNTER — Encounter: Payer: Self-pay | Admitting: Nurse Practitioner

## 2022-06-04 ENCOUNTER — Ambulatory Visit (INDEPENDENT_AMBULATORY_CARE_PROVIDER_SITE_OTHER): Payer: Medicare Other | Admitting: Nurse Practitioner

## 2022-06-04 VITALS — BP 140/70 | HR 60 | Temp 97.4°F | Ht 63.0 in | Wt 132.2 lb

## 2022-06-04 DIAGNOSIS — R6889 Other general symptoms and signs: Secondary | ICD-10-CM

## 2022-06-04 DIAGNOSIS — Z6822 Body mass index (BMI) 22.0-22.9, adult: Secondary | ICD-10-CM

## 2022-06-04 DIAGNOSIS — N184 Chronic kidney disease, stage 4 (severe): Secondary | ICD-10-CM | POA: Diagnosis not present

## 2022-06-04 DIAGNOSIS — I700 Atherosclerosis of aorta: Secondary | ICD-10-CM | POA: Diagnosis not present

## 2022-06-04 DIAGNOSIS — L237 Allergic contact dermatitis due to plants, except food: Secondary | ICD-10-CM | POA: Diagnosis not present

## 2022-06-04 DIAGNOSIS — M85839 Other specified disorders of bone density and structure, unspecified forearm: Secondary | ICD-10-CM

## 2022-06-04 DIAGNOSIS — I129 Hypertensive chronic kidney disease with stage 1 through stage 4 chronic kidney disease, or unspecified chronic kidney disease: Secondary | ICD-10-CM | POA: Diagnosis not present

## 2022-06-04 DIAGNOSIS — Z0000 Encounter for general adult medical examination without abnormal findings: Secondary | ICD-10-CM

## 2022-06-04 DIAGNOSIS — E559 Vitamin D deficiency, unspecified: Secondary | ICD-10-CM

## 2022-06-04 DIAGNOSIS — R7309 Other abnormal glucose: Secondary | ICD-10-CM

## 2022-06-04 DIAGNOSIS — I10 Essential (primary) hypertension: Secondary | ICD-10-CM

## 2022-06-04 DIAGNOSIS — Z79899 Other long term (current) drug therapy: Secondary | ICD-10-CM | POA: Diagnosis not present

## 2022-06-04 DIAGNOSIS — E782 Mixed hyperlipidemia: Secondary | ICD-10-CM

## 2022-06-04 DIAGNOSIS — F3342 Major depressive disorder, recurrent, in full remission: Secondary | ICD-10-CM

## 2022-06-04 DIAGNOSIS — Z0001 Encounter for general adult medical examination with abnormal findings: Secondary | ICD-10-CM

## 2022-06-04 NOTE — Patient Instructions (Signed)

## 2022-06-04 NOTE — Progress Notes (Signed)
MEDICARE ANNUAL WELLNESS VISIT AND FOLLOW UP  Assessment:   Diagnoses and all orders for this visit:  Encounter for Medicare annual wellness exam Due Annually  Health maintenance reviewed  Essential hypertension Controlled Continue Benicar HCT Discussed DASH (Dietary Approaches to Stop Hypertension) DASH diet is lower in sodium than a typical American diet. Cut back on foods that are high in saturated fat, cholesterol, and trans fats. Eat more whole-grain foods, fish, poultry, and nuts Remain active and exercise as tolerated daily.  Monitor BP at home-Call if greater than 130/80.  Check CMP/CBC  Hyperlipidemia/Aortic atherosclerosis (HCC) by CT scan 06/04/2019 LDL continues to be slightly elevated. Continue Pravastatin. Discussed lifestyle modifications. Recommended diet heavy in fruits and veggies, omega 3's. Decrease consumption of animal meats, cheeses, and dairy products. Remain active and exercise as tolerated. Continue to monitor. Check lipids  Osteopenia of forearm, unspecified laterality Last Dexa 2020 reviewed - Osteoporotic Defers Dexa. Defers further medical therapies. Pursue a combination of weight-bearing exercises and strength training. Advised on fall prevention measures including proper lighting in all rooms, removal of area rugs and floor clutter, use of walking devices as deemed appropriate, avoidance of uneven walking surfaces. Smoking cessation and moderate alcohol consumption if applicable Consume 800 to 1000 IU of vitamin D daily with a goal vitamin D serum value of 30 ng/mL or higher. Aim for 1000 to 1200 mg of elemental calcium daily through supplements and/or dietary sources.  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 Ella Jubilee Discussed how what you eat and drink can aide in kidney protection. Stay well hydrated. Avoid high salt foods. Avoid NSAIDS. Keep BP and BG well controlled.   Take medications as prescribed. Remain active and  exercise as tolerated daily. Maintain weight.  Continue to monitor. Check CMP/GFR  Other abnormal glucose (prediabetes) Prediabetic. Education: Reviewed 'ABCs' of diabetes management  Discussed goals to be met and/or maintained include A1C (<7) Blood pressure (<130/80) Cholesterol (LDL <70) Continue Eye Exam yearly  Continue Dental Exam Q6 mo Discussed dietary recommendations Discussed Physical Activity recommendations Check A1C  Vitamin D deficiency Continue supplement for goal of 60-100 Monitor Vitamin D levels  Recurrent major depressive disorder, in full remission (HCC) Controlled.   Continue Sertraline  Reviewed relaxation techniques.  Sleep hygiene. Discussed mindfulness meditation and exercise.   Encouraged personality growth wand development through coping techniques and problem-solving skills.  BMI 22.0-22.9, adult Discussed appropriate BMI Diet modification. Physical activity. Encouraged/praised to build confidence.  Medication management No medication changes today. All medications reviewed and discussed. All questions and concerns have been addressed.  Poison Ivy Zyrtec 10 mg sample provided Continue to monitor   Orders Placed This Encounter  Procedures   CBC with Differential/Platelet   COMPLETE METABOLIC PANEL WITH GFR   Lipid panel   Hemoglobin A1c   VITAMIN D 25 Hydroxy (Vit-D Deficiency, Fractures)    Notify office for further evaluation and treatment, questions or concerns if any reported s/s fail to improve.   The patient was advised to call back or seek an in-person evaluation if any symptoms worsen or if the condition fails to improve as anticipated.   Further disposition pending results of labs. Discussed med's effects and SE's.    I discussed the assessment and treatment plan with the patient. The patient was provided an opportunity to ask questions and all were answered. The patient agreed with the plan and demonstrated an  understanding of the instructions.  Discussed med's effects and SE's. Screening labs and tests  as requested with regular follow-up as recommended.  I provided 35 minutes of face-to-face time during this encounter including counseling, chart review, and critical decision making was preformed.  Today's Plan of Care is based on a patient-centered health care approach known as shared decision making - the decisions, tests and treatments allow for patient preferences and values to be balanced with clinical evidence.    Future Appointments  Date Time Provider Department Center  09/04/2022  2:30 PM Lucky Cowboy, MD [facility] None  12/05/2022  2:30 PM Adela Glimpse, NP [facility] None  03/07/2023  2:00 PM Lucky Cowboy, MD [facility] None     Plan:   During the course of the visit the patient was educated and counseled about appropriate screening and preventive services including:   Pneumococcal vaccine  Prevnar 13 Influenza vaccine Td vaccine Screening electrocardiogram Bone densitometry screening Colorectal cancer screening Diabetes screening Glaucoma screening Nutrition counseling  Advanced directives: requested   Subjective:  Monique Barton is a 82 y.o. female who presents for Medicare Annual Wellness Visit and 3 month follow up.   She has been working in her garden.  Has noticed a poison ivy rash along her right cheek.  Associated itching and redness.  Feels as though it is healing well.  She is not managing at this time.   She is now established with [facility], Dr. Thedore Mins.  She follows every six months.  She has hx of depression and remains in remission on zoloft 50 mg daily.  Feels mood is well controlled. Her daughter passed in 12/2020 from cancer.  BMI is Body mass index is 23.42 kg/m., she has been working on diet and exercise.  Wt Readings from Last 3 Encounters:  06/04/22 132 lb 3.2 oz (60 kg)  02/19/22 128 lb (58.1 kg)  09/27/21 127 lb 9.6 oz (57.9 kg)    She is now checking BP in the home and reports average around 130/80.  She does workout. She denies chest pain, shortness of breath, dizziness.   She is on cholesterol medication and denies myalgias. Her cholesterol is at goal. The cholesterol last visit was:   Lab Results  Component Value Date   CHOL 211 (H) 02/19/2022   HDL 65 02/19/2022   LDLCALC 125 (H) 02/19/2022   TRIG 107 02/19/2022   CHOLHDL 3.2 02/19/2022    She has been working on diet and exercise for glucose management (recent hx of prediabetes), and denies foot ulcerations, increased appetite, nausea, paresthesia of the feet, polydipsia, polyuria, visual disturbances, vomiting and weight loss.  Last A1C in the office was:  Lab Results  Component Value Date   HGBA1C 6.0 (H) 02/19/2022   She has CKD 3b associated with htn monitored at this office, on [facility] - following with Dr. Thedore Mins.  Last GFR:  Lab Results  Component Value Date   GFRNONAA 29 (L) 05/12/2020   GFRNONAA 32 (L) 01/28/2020   GFRNONAA 29 (L) 10/01/2019  She drinks ~2 bottles daily. Denies current or hx of NSAID use.   Patient is on Vitamin D supplement and near goal of 60-100:    Lab Results  Component Value Date   VD25OH 73 02/19/2022      Medication Review: Current Outpatient Medications on File Prior to Visit  Medication Sig Dispense Refill   Ascorbic Acid (VITAMIN C) 500 MG CAPS Take 1 capsule by mouth. Not on a regular basis     aspirin 81 MG tablet Take 81 mg by mouth daily.  Cholecalciferol (VITAMIN D PO) Take 5,000 Units by mouth daily.     ezetimibe (ZETIA) 10 MG tablet Take 1 tablet Daily for Cholesterol 90 tablet 3   Magnesium 400 MG TABS Take 1 tablet by mouth daily.     Multiple Vitamin (MULTIVITAMIN) tablet Take 1 tablet by mouth daily.     olmesartan-hydrochlorothiazide (BENICAR HCT) 20-12.5 MG tablet Take  1 tablet  Daily  for BP                                      /                                   Take                                   by                                   mouth 90 tablet 3   OVER THE COUNTER MEDICATION Takes an OTC Iron tablet daily.     pravastatin (PRAVACHOL) 20 MG tablet Take 1 tablet at Bedtime for Cholesterol. 90 tablet 3   sertraline (ZOLOFT) 50 MG tablet TAKE 1 TABLET BY MOUTH DAILY FOR MOOD 90 tablet 3   zinc gluconate 50 MG tablet Take 50 mg by mouth daily.     No current facility-administered medications on file prior to visit.    Allergies  Allergen Reactions   Prednisone Other (See Comments)    HIGH DOSE CAUSES INSOMNIA    Current Problems (verified) Patient Active Problem List   Diagnosis Date Noted   Aortic atherosclerosis (HCC) by CT scan 06/04/2019 06/04/2019   Osteopenia 06/16/2018   BMI 22.0-22.9, adult 06/05/2017   CKD (chronic kidney disease) stage 3, GFR 30-59 ml/min (HCC) 06/05/2017   Recurrent major depressive disorder, in full remission (HCC) 04/17/2016   Encounter for Medicare annual wellness exam 12/28/2014   Medication management 10/06/2013   Essential hypertension 11/20/2012   Hyperlipidemia 11/20/2012   Other abnormal glucose (prediabetes) 11/20/2012   Vitamin D deficiency 11/20/2012    Screening Tests Immunization History  Administered Date(s) Administered   Fluad Quad(high Dose 65+) 10/02/2018   Influenza Nasal 11/06/2011   Influenza, High Dose Seasonal PF 10/06/2013, 09/23/2014, 10/30/2017, 10/08/2019   Influenza-Unspecified 10/24/2012, 10/15/2016, 10/13/2021   PFIZER(Purple Top)SARS-COV-2 Vaccination 02/28/2019, 03/25/2019   Pneumococcal Conjugate-13 06/08/2014   Pneumococcal-Unspecified 02/07/2006   Tdap 04/04/2011   Zoster, Live 01/15/2009    Preventative care: Last colonoscopy: 1999 declines further work up Cologuard 03/10/2022 Negative  Mammogram 09/2018 declines further work up DEXA 09/2018 osteopenia - T -2.0  declines further work up PAP remote  Tdap 2013 Influenza 09/2021 Prenvar 13 2016 Pneumonia 2008 Zoster 2011, planning to get  shingrix Covid 19 2/2, pfizer   Names of Other Physician/Practitioners you currently use: 1. [facility] here for primary care 2. Dr. Macie Burows, eye doctor, 2023 3. Not seeing one, dentist, last visit remote, needs to schedule.  Patient Care Team: Lucky Cowboy, MD as PCP - General (Internal Medicine)  SURGICAL HISTORY She  has a past surgical history that includes Eye surgery (Bilateral, 2012). FAMILY HISTORY Her family  history includes Cancer in her brother, brother, and father; Diabetes in her brother, brother, and mother; Heart disease in her mother; Hyperlipidemia in her mother; Hypertension in her mother. SOCIAL HISTORY She  reports that she has never smoked. She has been exposed to tobacco smoke. She has never used smokeless tobacco. She reports that she does not drink alcohol and does not use drugs.   MEDICARE WELLNESS OBJECTIVES: Physical activity: Current Exercise Habits: Home exercise routine Cardiac risk factors:   Depression/mood screen:      06/04/2022    9:34 AM  Depression screen PHQ 2/9  Decreased Interest 0  PHQ - 2 Score 0    ADLs:     06/04/2022    9:34 AM 02/19/2022    8:39 PM  In your present state of health, do you have any difficulty performing the following activities:  Hearing? 0 0  Vision? 0 0  Difficulty concentrating or making decisions? 0 0  Walking or climbing stairs? 0 0  Dressing or bathing? 0 0  Doing errands, shopping? 0 0     Cognitive Testing  Alert? Yes  Normal Appearance?Yes  Oriented to person? Yes  Place? Yes   Time? Yes  Recall of three objects?  Yes  Can perform simple calculations? Yes  Displays appropriate judgment?Yes  Can read the correct time from a watch face?Yes  EOL planning: Does Patient Have a Medical Advance Directive?: No   Review of Systems  Constitutional:  Negative for malaise/fatigue and weight loss.  HENT:  Negative for hearing loss and tinnitus.   Eyes:  Negative  for blurred vision and double vision.  Respiratory:  Negative for cough, sputum production, shortness of breath and wheezing.   Cardiovascular:  Negative for chest pain, palpitations, orthopnea, claudication, leg swelling and PND.  Gastrointestinal:  Negative for abdominal pain, blood in stool, constipation, diarrhea, heartburn, melena, nausea and vomiting.  Genitourinary: Negative.   Musculoskeletal:  Negative for falls, joint pain and myalgias.  Skin:  Negative for rash.  Neurological:  Negative for dizziness, tingling, sensory change, weakness and headaches.  Endo/Heme/Allergies:  Negative for polydipsia.  Psychiatric/Behavioral: Negative.  Negative for depression, memory loss, substance abuse and suicidal ideas. The patient is not nervous/anxious and does not have insomnia.   All other systems reviewed and are negative.    Objective:     Today's Vitals   06/04/22 0900  BP: (!) 140/70  Pulse: 60  Temp: (!) 97.4 F (36.3 C)  SpO2: 99%  Weight: 132 lb 3.2 oz (60 kg)  Height: 5\' 3"  (1.6 m)   Body mass index is 23.42 kg/m.  General appearance: alert, no distress, WD/WN, female HEENT: normocephalic, sclerae anicteric, TMs pearly, nares patent, no discharge or erythema, mask in place; oral exam deferred. Hearing normal.  Neck: supple, no lymphadenopathy, no thyromegaly, no masses Heart: RRR, normal S1, S2, no murmurs Lungs: CTA bilaterally, no wheezes, rhonchi, or rales Abdomen: +bs, soft, non tender, non distended, no masses, no hepatomegaly, no splenomegaly Musculoskeletal: nontender, no swelling, no obvious deformity Extremities: no edema, no cyanosis, no clubbing Pulses: 2+ symmetric, upper and lower extremities, normal cap refill Neurological: alert, oriented x 3, CN2-12 intact, strength normal upper extremities and lower extremities, sensation normal throughout, DTRs 2+ throughout, no cerebellar signs, gait normal Psychiatric: normal affect, behavior normal, pleasant  Skin:   Right check with linear raised round crusted dry rash.  Surrounding skin WNL.  Medicare Attestation I have personally reviewed: The patient's medical and social history Their use  of alcohol, tobacco or illicit drugs Their current medications and supplements The patient's functional ability including ADLs,fall risks, home safety risks, cognitive, and hearing and visual impairment Diet and physical activities Evidence for depression or mood disorders  The patient's weight, height, BMI, and visual acuity have been recorded in the chart.  I have made referrals, counseling, and provided education to the patient based on review of the above and I have provided the patient with a written personalized care plan for preventive services.     Adela Glimpse, NP   06/04/2022

## 2022-06-05 LAB — LIPID PANEL
Cholesterol: 156 mg/dL (ref <200)
HDL: 61 mg/dL (ref >=50)
LDL Cholesterol (Calc): 75 mg/dL (calc)
Non-HDL Cholesterol (Calc): 95 mg/dL (calc) (ref <130)
Total CHOL/HDL Ratio: 2.6 (calc) (ref <5.0)
Triglycerides: 114 mg/dL (ref <150)

## 2022-06-05 LAB — CBC WITH DIFFERENTIAL/PLATELET
Absolute Monocytes: 452 cells/uL (ref 200–950)
Basophils Absolute: 41 cells/uL (ref 0–200)
Basophils Relative: 0.7 %
Eosinophils Absolute: 232 cells/uL (ref 15–500)
Eosinophils Relative: 4 %
HCT: 35.6 % (ref 35.0–45.0)
Hemoglobin: 11.4 g/dL — ABNORMAL LOW (ref 11.7–15.5)
Lymphs Abs: 1270 cells/uL (ref 850–3900)
MCH: 29.9 pg (ref 27.0–33.0)
MCHC: 32 g/dL (ref 32.0–36.0)
MCV: 93.4 fL (ref 80.0–100.0)
MPV: 11.6 fL (ref 7.5–12.5)
Monocytes Relative: 7.8 %
Neutro Abs: 3805 cells/uL (ref 1500–7800)
Neutrophils Relative %: 65.6 %
Platelets: 196 10*3/uL (ref 140–400)
RBC: 3.81 10*6/uL (ref 3.80–5.10)
RDW: 12.8 % (ref 11.0–15.0)
Total Lymphocyte: 21.9 %
WBC: 5.8 10*3/uL (ref 3.8–10.8)

## 2022-06-05 LAB — COMPLETE METABOLIC PANEL WITH GFR
AG Ratio: 1.7 (calc) (ref 1.0–2.5)
ALT: 13 U/L (ref 6–29)
AST: 20 U/L (ref 10–35)
Albumin: 4.3 g/dL (ref 3.6–5.1)
Alkaline phosphatase (APISO): 68 U/L (ref 37–153)
BUN/Creatinine Ratio: 19 (calc) (ref 6–22)
BUN: 34 mg/dL — ABNORMAL HIGH (ref 7–25)
CO2: 30 mmol/L (ref 20–32)
Calcium: 9.4 mg/dL (ref 8.6–10.4)
Chloride: 102 mmol/L (ref 98–110)
Creat: 1.76 mg/dL — ABNORMAL HIGH (ref 0.60–0.95)
Globulin: 2.5 g/dL (calc) (ref 1.9–3.7)
Glucose, Bld: 93 mg/dL (ref 65–99)
Potassium: 4.8 mmol/L (ref 3.5–5.3)
Sodium: 140 mmol/L (ref 135–146)
Total Bilirubin: 0.4 mg/dL (ref 0.2–1.2)
Total Protein: 6.8 g/dL (ref 6.1–8.1)
eGFR: 29 mL/min/{1.73_m2} — ABNORMAL LOW (ref >=60)

## 2022-06-05 LAB — HEMOGLOBIN A1C
Hgb A1c MFr Bld: 5.9 % of total Hgb — ABNORMAL HIGH (ref <5.7)
Mean Plasma Glucose: 123 mg/dL
eAG (mmol/L): 6.8 mmol/L

## 2022-06-05 LAB — VITAMIN D 25 HYDROXY (VIT D DEFICIENCY, FRACTURES): Vit D, 25-Hydroxy: 56 ng/mL (ref 30–100)

## 2022-06-12 NOTE — Progress Notes (Signed)
Patient is aware of lab results and instructions. -e welch

## 2022-07-17 DIAGNOSIS — N184 Chronic kidney disease, stage 4 (severe): Secondary | ICD-10-CM | POA: Diagnosis not present

## 2022-07-27 DIAGNOSIS — N2581 Secondary hyperparathyroidism of renal origin: Secondary | ICD-10-CM | POA: Diagnosis not present

## 2022-07-27 DIAGNOSIS — I129 Hypertensive chronic kidney disease with stage 1 through stage 4 chronic kidney disease, or unspecified chronic kidney disease: Secondary | ICD-10-CM | POA: Diagnosis not present

## 2022-07-27 DIAGNOSIS — N184 Chronic kidney disease, stage 4 (severe): Secondary | ICD-10-CM | POA: Diagnosis not present

## 2022-07-27 DIAGNOSIS — D631 Anemia in chronic kidney disease: Secondary | ICD-10-CM | POA: Diagnosis not present

## 2022-07-27 DIAGNOSIS — E785 Hyperlipidemia, unspecified: Secondary | ICD-10-CM | POA: Diagnosis not present

## 2022-08-01 DIAGNOSIS — L570 Actinic keratosis: Secondary | ICD-10-CM | POA: Diagnosis not present

## 2022-08-01 DIAGNOSIS — L820 Inflamed seborrheic keratosis: Secondary | ICD-10-CM | POA: Diagnosis not present

## 2022-08-14 ENCOUNTER — Encounter (HOSPITAL_BASED_OUTPATIENT_CLINIC_OR_DEPARTMENT_OTHER): Payer: Self-pay

## 2022-08-14 ENCOUNTER — Emergency Department (HOSPITAL_BASED_OUTPATIENT_CLINIC_OR_DEPARTMENT_OTHER)
Admission: EM | Admit: 2022-08-14 | Discharge: 2022-08-14 | Disposition: A | Discharge status: Home / Self Care | Payer: Medicare Other | Attending: Emergency Medicine | Admitting: Emergency Medicine

## 2022-08-14 ENCOUNTER — Ambulatory Visit
Admission: EM | Admit: 2022-08-14 | Discharge: 2022-08-14 | Disposition: A | Discharge status: Home / Self Care | Payer: Medicare Other

## 2022-08-14 ENCOUNTER — Other Ambulatory Visit: Payer: Self-pay

## 2022-08-14 ENCOUNTER — Encounter: Payer: Self-pay | Admitting: *Deleted

## 2022-08-14 ENCOUNTER — Emergency Department (HOSPITAL_BASED_OUTPATIENT_CLINIC_OR_DEPARTMENT_OTHER): Payer: Medicare Other | Admitting: Radiology

## 2022-08-14 ENCOUNTER — Emergency Department (HOSPITAL_BASED_OUTPATIENT_CLINIC_OR_DEPARTMENT_OTHER): Payer: Medicare Other

## 2022-08-14 DIAGNOSIS — M25551 Pain in right hip: Secondary | ICD-10-CM

## 2022-08-14 DIAGNOSIS — R109 Unspecified abdominal pain: Secondary | ICD-10-CM | POA: Diagnosis not present

## 2022-08-14 DIAGNOSIS — Z7982 Long term (current) use of aspirin: Secondary | ICD-10-CM | POA: Diagnosis not present

## 2022-08-14 DIAGNOSIS — M8580 Other specified disorders of bone density and structure, unspecified site: Secondary | ICD-10-CM | POA: Diagnosis not present

## 2022-08-14 DIAGNOSIS — W19XXXA Unspecified fall, initial encounter: Secondary | ICD-10-CM

## 2022-08-14 DIAGNOSIS — M47816 Spondylosis without myelopathy or radiculopathy, lumbar region: Secondary | ICD-10-CM | POA: Diagnosis not present

## 2022-08-14 DIAGNOSIS — Z79899 Other long term (current) drug therapy: Secondary | ICD-10-CM | POA: Diagnosis not present

## 2022-08-14 MED ORDER — DICLOFENAC SODIUM 1 % EX GEL: 4.0000 g | Freq: Four times a day (QID) | CUTANEOUS | 0 refills | Status: AC

## 2022-08-14 NOTE — Discharge Instructions (Signed)
Please report to [facility]   [address] [place], [place]

## 2022-08-14 NOTE — ED Triage Notes (Signed)
Pt to ED c/o right hip pain that's  progressively getting worse. Reports fell doing yard work 2 weeks ago. Not on blood thinners, ambulatory in triage.

## 2022-08-14 NOTE — Discharge Instructions (Signed)
Your XR and CT scan today are reassuring- no evidence of a broken bone. Recommend follow up with orthopedics, please call to schedule an appointment.  You can apply topical Voltaren gel to the area as prescribed for pain.

## 2022-08-14 NOTE — ED Triage Notes (Signed)
Pt reports while she was push mowing her bank she went in a ditch.Pt fell to Lt knee and then onto RT hip. Pt has had RT hip pain for 2 weeks.

## 2022-08-14 NOTE — ED Provider Notes (Signed)
Patient here today for evaluation of right hip pain after a fall while push mowing 2 weeks ago. She reports continued lateral hip pain that is worse with walking. She has known osteopenia. Recommended further evaluation in the ED as we do not have xray capability today but also that she will likely need CT of her hip given duration of time since injury. Patient is agreeable and will report to ED via POV.   Tomi Bamberger, PA-C 08/14/22 1034

## 2022-08-14 NOTE — ED Provider Notes (Signed)
[facility] AT [facility] Provider Note   CSN: [id number] Arrival date & time: 08/14/22  1208     History  Chief Complaint  Patient presents with   Hip Pain    right    Monique Barton is a 82 y.o. female.  82 year old female presents with complaint of pain in her right hip after falling while mowing the grass 2 weeks ago.  Patient states that she was mowing on uneven ground on a hill in her yard when she lost her balance and fell landing on her left knee and right hip.  Pain worse with bearing weight, located lateral hip, not present upon waking but develops throughout the day and is progressively worse by bedtime.  No other injuries or concerns today.  Not on blood thinners.       Home Medications Prior to Admission medications   Medication Sig Start Date End Date Taking? Authorizing Provider  diclofenac Sodium (VOLTAREN) 1 % GEL Apply 4 g topically 4 (four) times daily. 08/14/22  Yes Jeannie Fend, PA-C  Ascorbic Acid (VITAMIN C) 500 MG CAPS Take 1 capsule by mouth. Not on a regular basis    [provider]  aspirin 81 MG tablet Take 81 mg by mouth daily.    [provider]  Cholecalciferol (VITAMIN D PO) Take 5,000 Units by mouth daily.    [provider]  ezetimibe (ZETIA) 10 MG tablet Take 1 tablet Daily for Cholesterol 02/20/22   Lucky Cowboy, MD  Magnesium 400 MG TABS Take 1 tablet by mouth daily.    [provider]  Multiple Vitamin (MULTIVITAMIN) tablet Take 1 tablet by mouth daily.    [provider]  OVER THE COUNTER MEDICATION Takes an OTC Iron tablet daily.    [provider]  pravastatin (PRAVACHOL) 20 MG tablet Take 1 tablet at Bedtime for Cholesterol. 02/19/22   Lucky Cowboy, MD  sertraline (ZOLOFT) 50 MG tablet TAKE 1 TABLET BY MOUTH DAILY FOR MOOD 06/07/21   Judd Gaudier, NP  zinc gluconate 50 MG tablet Take 50 mg by mouth daily.    [provider]       Allergies    Prednisone    Review of Systems   Review of Systems Negative except as per HPI Physical Exam Updated Vital Signs BP (!) 145/56   Pulse (!) 58   Temp 98.5 F (36.9 C) (Oral)   Resp 16   Ht 5\' 3"  (1.6 m)   Wt 58.1 kg   SpO2 97%   BMI 22.67 kg/m  Physical Exam Vitals and nursing note reviewed.  Constitutional:      General: She is not in acute distress.    Appearance: She is well-developed. She is not diaphoretic.  HENT:     Head: Normocephalic and atraumatic.  Pulmonary:     Effort: Pulmonary effort is normal.  Abdominal:     Palpations: Abdomen is soft.     Tenderness: There is no abdominal tenderness.  Musculoskeletal:        General: Tenderness present. No swelling or deformity.     Right lower leg: No edema.     Left lower leg: No edema.       Legs:     Comments: Mild TTP lateral right hip. No pain with log roll or ROM of right hip, no posterior TTP.   Skin:    General: Skin is warm and dry.     Findings: No erythema or rash.  Neurological:     Mental Status: She is alert and oriented to person, place, and time.     Sensory: No sensory deficit.     Motor: No weakness.  Psychiatric:        Behavior: Behavior normal.     ED Results / Procedures / Treatments   Labs (all labs ordered are listed, but only abnormal results are displayed) Labs Reviewed - No data to display  EKG None  Radiology CT PELVIS WO CONTRAST  Result Date: 08/14/2022 CLINICAL DATA:  Pain after fall EXAM: CT PELVIS WITHOUT CONTRAST TECHNIQUE: Multidetector CT imaging of the pelvis was performed following the standard protocol without intravenous contrast. RADIATION DOSE REDUCTION: This exam was performed according to the departmental dose-optimization program which includes automated exposure control, adjustment of the mA and/or kV according to patient size and/or use of iterative reconstruction technique. COMPARISON:  X-ray earlier 08/14/2022 FINDINGS: Urinary Tract:   Preserved contours of the urinary bladder. Bowel: On this non oral contrast exam the large bowel in the pelvis has a normal course and caliber with scattered stool. The small bowel is also nondilated. Normal retrocecal appendix in the right hemipelvis. Vascular/Lymphatic: Minimal vascular calcifications identified along the iliac vessels. No discrete lymph node enlargement identified in the pelvis. Reproductive: Uterus is present with some dystrophic calcifications, possible small fibroids. No separate adnexal mass. Other: No free fluid in the dependent pelvis. No anterior pelvic wall hernia. Musculoskeletal: Osteopenia. Chondrocalcinosis along the pubic symphysis. Hypertrophic changes along the symphysis. There is some sclerosis and osteophytes along the sacroiliac joints. Mild joint space loss of the hips. Minimal osteophyte formation. No clear fracture or dislocation. Prominent degenerative changes of the lumbar spine at the edge of the imaging field. Partial sacralization of the right side of L5. IMPRESSION: Osteopenia and degenerative changes. Electronically Signed   By: Karen Kays M.D.   On: 08/14/2022 15:14   DG Hip Unilat With Pelvis 2-3 Views Right  Result Date: 08/14/2022 CLINICAL DATA:  Pain after fall EXAM: DG HIP (WITH OR WITHOUT PELVIS) 3V RIGHT COMPARISON:  None Available. FINDINGS: No fracture or dislocation. Preserved hip joint spaces. Osteopenia. Mild degenerative changes of the sacroiliac joints. Degenerative changes of the lower lumbar spine at the edge of the imaging field. Probable vascular calcifications in the pelvis. IMPRESSION: Osteopenia.  Mild degenerative changes. Electronically Signed   By: Karen Kays M.D.   On: 08/14/2022 14:02    Procedures Procedures    Medications Ordered in ED Medications - No data to display  ED Course/ Medical Decision Making/ A&P                                 Medical Decision Making Amount and/or Complexity of Data Reviewed Radiology:  ordered.   82 year old female presents with complaint of right hip pain after a fall 2 weeks ago.  Found to have tenderness palpation of the lateral aspect of the right hip, no pain with range of motion.  DP pulses intact.  Overlying skin is unremarkable.  No tenderness lower back or posterior hip.  X-ray of the right hip as ordered interpreted self is negative for acute bony abnormality, agree with radiologist interpretation showing osteopenia.  Add on CT pelvis to evaluate for occult fracture.  CT is negative for occult fracture, agree with radiologist to rotation.  Recommend topical Voltaren gel, follow-up with orthopedics or see PCP.        Final  Clinical Impression(s) / ED Diagnoses Final diagnoses:  Right hip pain    Rx / DC Orders ED Discharge Orders          Ordered    diclofenac Sodium (VOLTAREN) 1 % GEL  4 times daily        08/14/22 1521              Jeannie Fend, PA-C 08/14/22 1547    Terald Sleeper, MD 08/14/22 [phone]

## 2022-08-14 NOTE — ED Notes (Signed)
Patient is being discharged from the Urgent Care and sent to the Emergency Department via Private Vehicle . Per R. Reggie Pile, patient is in need of higher level of care due to Complex Care Needs (and possible CT of hip). Patient is aware and verbalizes understanding of plan of care.  Vitals:   08/14/22 0952  BP: (!) 143/75  Pulse: 64  Resp: 18  Temp: 98 F (36.7 C)  SpO2: 94%

## 2022-08-20 ENCOUNTER — Telehealth: Payer: Self-pay

## 2022-08-20 NOTE — Telephone Encounter (Signed)
Transition Care Management Unsuccessful Follow-up Telephone Call  Date of discharge and from where:  08/14/2022 [facility]  Attempts:  1st Attempt  Reason for unsuccessful TCM follow-up call:  No answer/busy   [facility]  [facility] Population Health Community Resource Care Guide   [email or web address]  ?? [phone]   Website: [email or web address]

## 2022-08-21 ENCOUNTER — Telehealth: Payer: Self-pay

## 2022-08-21 NOTE — Telephone Encounter (Signed)
Transition Care Management Unsuccessful Follow-up Telephone Call  Date of discharge and from where:  08/14/2022 [facility]  Attempts:  2nd Attempt  Reason for unsuccessful TCM follow-up call:  Left voice message   [facility]  [facility] Population Health Community Resource Care Guide   [email or web address]  ?? [phone]   Website: [email or web address]

## 2022-08-22 ENCOUNTER — Encounter: Payer: Self-pay | Admitting: Nurse Practitioner

## 2022-08-22 ENCOUNTER — Ambulatory Visit: Payer: Medicare Other | Admitting: Nurse Practitioner

## 2022-08-22 VITALS — BP 138/70 | HR 68 | Temp 97.4°F | Ht 63.0 in | Wt 128.2 lb

## 2022-08-22 DIAGNOSIS — M25551 Pain in right hip: Secondary | ICD-10-CM | POA: Diagnosis not present

## 2022-08-22 DIAGNOSIS — Z79899 Other long term (current) drug therapy: Secondary | ICD-10-CM

## 2022-08-22 NOTE — Progress Notes (Signed)
Hospital follow up  Assessment and Plan: Hospital visit follow up for:   1. Right hip pain Increase Tylenol dose up to TID. Discussed may benefit with 1,000 mg QID. Continue Voltaren gel PRN Discussed light exercise and movement for increase in muscle support/structure to aide in bone protection. Continue f/u apt with Orthopedics 09/04/22.  2. Medication management All medications discussed and reviewed in full. All questions and concerns regarding medications addressed.     All medications were reviewed with patient and family and fully reconciled. All questions answered fully, and patient and family members were encouraged to call the office with any further questions or concerns. Discussed goal to avoid readmission related to this diagnosis.   Over 20 minutes of exam, counseling, chart review, and complex, high/moderate level critical decision making was performed this visit.   Future Appointments  Date Time Provider Department Center  09/04/2022  2:30 PM Lucky Cowboy, MD [facility] None  12/05/2022  2:30 PM Adela Glimpse, NP [facility] None  03/07/2023  2:00 PM Lucky Cowboy, MD [facility] None     HPI 82 y.o.female presents for follow up for transition from recent hospitalization or [facility] stay. Admit date to the hospital was 08/14/22, patient was discharged from the hospital on 08/14/22 and our clinical staff contacted the office the day after discharge to set up a follow up appointment. The discharge summary, medications, and diagnostic test results were reviewed before meeting with the patient. The patient was admitted for right hip pain after a fall.    82 year old female presents with complaint of pain in her right hip after falling while mowing the grass 2 weeks ago.  Patient states that she was mowing on uneven ground on a hill in her yard when she lost her balance and fell landing on her left knee and right hip.  Pain worse with bearing weight, located lateral hip, not  present upon waking but develops throughout the day and is progressively worse by bedtime.  No other injuries or concerns.  Not on blood thinners.  CT Scan was completed to show no clear fracture or dislocation.  Scan did note osteopenia and degenerative changes.  She was   She has been taking 600 mg Tylenol PRN and continues to note pain.  She does admit pain is slightly better than when she originally fell.  She has continued to remain active.  She has tried the topical Voltaren gel without relief.  She has a f/u apt with Orthopedics 09/04/22.  Images while in the hospital: CT PELVIS WO CONTRAST  Result Date: 08/14/2022 CLINICAL DATA:  Pain after fall EXAM: CT PELVIS WITHOUT CONTRAST TECHNIQUE: Multidetector CT imaging of the pelvis was performed following the standard protocol without intravenous contrast. RADIATION DOSE REDUCTION: This exam was performed according to the departmental dose-optimization program which includes automated exposure control, adjustment of the mA and/or kV according to patient size and/or use of iterative reconstruction technique. COMPARISON:  X-ray earlier 08/14/2022 FINDINGS: Urinary Tract:  Preserved contours of the urinary bladder. Bowel: On this non oral contrast exam the large bowel in the pelvis has a normal course and caliber with scattered stool. The small bowel is also nondilated. Normal retrocecal appendix in the right hemipelvis. Vascular/Lymphatic: Minimal vascular calcifications identified along the iliac vessels. No discrete lymph node enlargement identified in the pelvis. Reproductive: Uterus is present with some dystrophic calcifications, possible small fibroids. No separate adnexal mass. Other: No free fluid in the dependent pelvis. No anterior pelvic wall hernia. Musculoskeletal: Osteopenia. Chondrocalcinosis along  the pubic symphysis. Hypertrophic changes along the symphysis. There is some sclerosis and osteophytes along the sacroiliac joints. Mild joint space  loss of the hips. Minimal osteophyte formation. No clear fracture or dislocation. Prominent degenerative changes of the lumbar spine at the edge of the imaging field. Partial sacralization of the right side of L5. IMPRESSION: Osteopenia and degenerative changes. Electronically Signed   By: Karen Kays M.D.   On: 08/14/2022 15:14   DG Hip Unilat With Pelvis 2-3 Views Right  Result Date: 08/14/2022 CLINICAL DATA:  Pain after fall EXAM: DG HIP (WITH OR WITHOUT PELVIS) 3V RIGHT COMPARISON:  None Available. FINDINGS: No fracture or dislocation. Preserved hip joint spaces. Osteopenia. Mild degenerative changes of the sacroiliac joints. Degenerative changes of the lower lumbar spine at the edge of the imaging field. Probable vascular calcifications in the pelvis. IMPRESSION: Osteopenia.  Mild degenerative changes. Electronically Signed   By: Karen Kays M.D.   On: 08/14/2022 14:02      Current Outpatient Medications (Cardiovascular):    ezetimibe (ZETIA) 10 MG tablet, Take 1 tablet Daily for Cholesterol   pravastatin (PRAVACHOL) 20 MG tablet, Take 1 tablet at Bedtime for Cholesterol.   Current Outpatient Medications (Analgesics):    aspirin 81 MG tablet, Take 81 mg by mouth daily.   Current Outpatient Medications (Other):    Ascorbic Acid (VITAMIN C) 500 MG CAPS, Take 1 capsule by mouth. Not on a regular basis   Cholecalciferol (VITAMIN D PO), Take 5,000 Units by mouth daily.   diclofenac Sodium (VOLTAREN) 1 % GEL, Apply 4 g topically 4 (four) times daily.   Magnesium 400 MG TABS, Take 1 tablet by mouth daily.   Multiple Vitamin (MULTIVITAMIN) tablet, Take 1 tablet by mouth daily.   OVER THE COUNTER MEDICATION, Takes an OTC Iron tablet daily.   sertraline (ZOLOFT) 50 MG tablet, TAKE 1 TABLET BY MOUTH DAILY FOR MOOD   zinc gluconate 50 MG tablet, Take 50 mg by mouth daily.  Past Medical History:  Diagnosis Date   Hyperlipidemia    Hypertension    Other abnormal glucose    Unspecified  vitamin D deficiency    Wrist fracture, right      Allergies  Allergen Reactions   Prednisone Other (See Comments)    HIGH DOSE CAUSES INSOMNIA    ROS: all negative except above.   Physical Exam: Filed Weights   08/22/22 1059  Weight: 128 lb 3.2 oz (58.2 kg)   BP 138/70   Pulse 68   Temp (!) 97.4 F (36.3 C)   Ht 5\' 3"  (1.6 m)   Wt 128 lb 3.2 oz (58.2 kg)   SpO2 97%   BMI 22.71 kg/m  General Appearance: Well nourished, in no apparent distress. Eyes: PERRLA, EOMs, conjunctiva no swelling or erythema Sinuses: No Frontal/maxillary tenderness ENT/Mouth: Ext aud canals clear, TMs without erythema, bulging. No erythema, swelling, or exudate on post pharynx.  Tonsils not swollen or erythematous. Hearing normal.  Neck: Supple, thyroid normal.  Respiratory: Respiratory effort normal, BS equal bilaterally without rales, rhonchi, wheezing or stridor.  Cardio: RRR with no MRGs. Brisk peripheral pulses without edema.  Abdomen: Soft, + BS.  Non tender, no guarding, rebound, hernias, masses. Lymphatics: Non tender without lymphadenopathy.  Musculoskeletal: Full ROM, 5/5 strength, normal gait.  Skin: Warm, dry without rashes, lesions, ecchymosis.  Neuro: Cranial nerves intact. Normal muscle tone, no cerebellar symptoms. Sensation intact.  Psych: Awake and oriented X 3, normal affect, Insight and Judgment appropriate.  Adela Glimpse, NP 11:21 AM [facility], [facility]

## 2022-08-22 NOTE — Patient Instructions (Addendum)
Hip Pain The hip is the joint between the upper legs and the lower pelvis. The bones, cartilage, tendons, and muscles of your hip joint support your body and allow you to move around. Hip pain can range from a minor ache to severe pain in one or both of your hips. The pain may be felt on the inside of the hip joint near the groin, or on the outside near the buttocks and upper thigh. You may also have swelling or stiffness in your hip area. Follow these instructions at home: Managing pain, stiffness, and swelling     If told, put ice on the painful area. Put ice in a plastic bag. Place a towel between your skin and the bag. Leave the ice on for 20 minutes, 2-3 times a day. If told, apply heat to the affected area as often as told by your health care provider. Use the heat source that your provider recommends, such as a moist heat pack or a heating pad. Place a towel between your skin and the heat source. Leave the heat on for 20-30 minutes. If your skin turns bright red, remove the ice or heat right away to prevent skin damage. The risk of damage is higher if you cannot feel pain, heat, or cold. Activity Do exercises as told by your provider. Avoid activities that cause pain. General instructions  Take over-the-counter and prescription medicines only as told by your provider. Keep a journal of your symptoms. Write down: How often you have hip pain. The location of your pain. What the pain feels like. What makes the pain worse. Sleep with a pillow between your legs on your most comfortable side. Keep all follow-up visits. Your provider will monitor your pain and activity. Contact a health care provider if: You cannot put weight on your leg. Your pain or swelling gets worse after a week. It gets harder to walk. You have a fever. Get help right away if: You fall. You have a sudden increase in pain and swelling in your hip. Your hip is red or swollen or very tender to touch. This  information is not intended to replace advice given to you by your health care provider. Make sure you discuss any questions you have with your health care provider. Document Revised: 09/05/2021 Document Reviewed: 09/05/2021 Elsevier Patient Education  2024 [employer].  

## 2022-08-28 ENCOUNTER — Telehealth: Payer: Self-pay | Admitting: Internal Medicine

## 2022-08-28 NOTE — Telephone Encounter (Signed)
Pt refused ER f/u.

## 2022-09-04 ENCOUNTER — Ambulatory Visit: Payer: Medicare Other | Admitting: Internal Medicine

## 2022-09-17 ENCOUNTER — Other Ambulatory Visit: Payer: Self-pay | Admitting: Internal Medicine

## 2022-09-17 DIAGNOSIS — I10 Essential (primary) hypertension: Secondary | ICD-10-CM

## 2022-09-21 DIAGNOSIS — H0102A Squamous blepharitis right eye, upper and lower eyelids: Secondary | ICD-10-CM | POA: Diagnosis not present

## 2022-09-21 DIAGNOSIS — H1045 Other chronic allergic conjunctivitis: Secondary | ICD-10-CM | POA: Diagnosis not present

## 2022-09-21 DIAGNOSIS — H527 Unspecified disorder of refraction: Secondary | ICD-10-CM | POA: Diagnosis not present

## 2022-09-21 DIAGNOSIS — H0102B Squamous blepharitis left eye, upper and lower eyelids: Secondary | ICD-10-CM | POA: Diagnosis not present

## 2022-09-21 DIAGNOSIS — Z961 Presence of intraocular lens: Secondary | ICD-10-CM | POA: Diagnosis not present

## 2022-09-22 ENCOUNTER — Other Ambulatory Visit: Payer: Self-pay | Admitting: Internal Medicine

## 2022-09-22 DIAGNOSIS — I10 Essential (primary) hypertension: Secondary | ICD-10-CM

## 2022-11-05 DIAGNOSIS — M7061 Trochanteric bursitis, right hip: Secondary | ICD-10-CM | POA: Diagnosis not present

## 2022-11-19 DIAGNOSIS — N184 Chronic kidney disease, stage 4 (severe): Secondary | ICD-10-CM | POA: Diagnosis not present

## 2022-11-29 DIAGNOSIS — E875 Hyperkalemia: Secondary | ICD-10-CM | POA: Diagnosis not present

## 2022-11-29 DIAGNOSIS — N2581 Secondary hyperparathyroidism of renal origin: Secondary | ICD-10-CM | POA: Diagnosis not present

## 2022-11-29 DIAGNOSIS — N184 Chronic kidney disease, stage 4 (severe): Secondary | ICD-10-CM | POA: Diagnosis not present

## 2022-11-29 DIAGNOSIS — I129 Hypertensive chronic kidney disease with stage 1 through stage 4 chronic kidney disease, or unspecified chronic kidney disease: Secondary | ICD-10-CM | POA: Diagnosis not present

## 2022-11-29 DIAGNOSIS — D631 Anemia in chronic kidney disease: Secondary | ICD-10-CM | POA: Diagnosis not present

## 2022-12-03 DIAGNOSIS — M7061 Trochanteric bursitis, right hip: Secondary | ICD-10-CM | POA: Diagnosis not present

## 2022-12-05 ENCOUNTER — Encounter: Payer: Self-pay | Admitting: Nurse Practitioner

## 2022-12-05 ENCOUNTER — Ambulatory Visit (INDEPENDENT_AMBULATORY_CARE_PROVIDER_SITE_OTHER): Payer: Medicare Other | Admitting: Nurse Practitioner

## 2022-12-05 VITALS — BP 138/60 | HR 61 | Temp 97.9°F | Ht 63.0 in | Wt 127.8 lb

## 2022-12-05 DIAGNOSIS — I129 Hypertensive chronic kidney disease with stage 1 through stage 4 chronic kidney disease, or unspecified chronic kidney disease: Secondary | ICD-10-CM | POA: Diagnosis not present

## 2022-12-05 DIAGNOSIS — N184 Chronic kidney disease, stage 4 (severe): Secondary | ICD-10-CM | POA: Diagnosis not present

## 2022-12-05 DIAGNOSIS — E559 Vitamin D deficiency, unspecified: Secondary | ICD-10-CM

## 2022-12-05 DIAGNOSIS — I10 Essential (primary) hypertension: Secondary | ICD-10-CM

## 2022-12-05 DIAGNOSIS — E782 Mixed hyperlipidemia: Secondary | ICD-10-CM | POA: Diagnosis not present

## 2022-12-05 DIAGNOSIS — Z6822 Body mass index (BMI) 22.0-22.9, adult: Secondary | ICD-10-CM | POA: Diagnosis not present

## 2022-12-05 DIAGNOSIS — M85839 Other specified disorders of bone density and structure, unspecified forearm: Secondary | ICD-10-CM | POA: Diagnosis not present

## 2022-12-05 DIAGNOSIS — R7309 Other abnormal glucose: Secondary | ICD-10-CM

## 2022-12-05 DIAGNOSIS — D649 Anemia, unspecified: Secondary | ICD-10-CM | POA: Diagnosis not present

## 2022-12-05 DIAGNOSIS — Z79899 Other long term (current) drug therapy: Secondary | ICD-10-CM | POA: Diagnosis not present

## 2022-12-05 DIAGNOSIS — F3342 Major depressive disorder, recurrent, in full remission: Secondary | ICD-10-CM

## 2022-12-05 NOTE — Patient Instructions (Signed)

## 2022-12-05 NOTE — Progress Notes (Signed)
FOLLOW UP  Assessment:   Diagnoses and all orders for this visit:  Essential hypertension Controlled Continue Amlodipine  Discussed DASH (Dietary Approaches to Stop Hypertension) DASH diet is lower in sodium than a typical American diet. Cut back on foods that are high in saturated fat, cholesterol, and trans fats. Eat more whole-grain foods, fish, poultry, and nuts Remain active and exercise as tolerated daily.  Monitor BP at home-Call if greater than 130/80.  Check CMP/CBC  Hyperlipidemia/Aortic atherosclerosis (HCC) by CT scan 06/04/2019 LDL continues to be slightly elevated. Continue Pravastatin. Discussed lifestyle modifications. Recommended diet heavy in fruits and veggies, omega 3's. Decrease consumption of animal meats, cheeses, and dairy products. Remain active and exercise as tolerated. Continue to monitor. Check lipids  Osteopenia of forearm, unspecified laterality Last Dexa 2020 reviewed - Osteoporotic Defers Dexa. Defers further medical therapies. Pursue a combination of weight-bearing exercises and strength training. Advised on fall prevention measures including proper lighting in all rooms, removal of area rugs and floor clutter, use of walking devices as deemed appropriate, avoidance of uneven walking surfaces. Smoking cessation and moderate alcohol consumption if applicable Consume 800 to 1000 IU of vitamin D daily with a goal vitamin D serum value of 30 ng/mL or higher. Aim for 1000 to 1200 mg of elemental calcium daily through supplements and/or dietary sources.  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 Ella Jubilee Discussed how what you eat and drink can aide in kidney protection. Stay well hydrated. Avoid high salt foods. Avoid NSAIDS. Keep BP and BG well controlled.   Take medications as prescribed. Remain active and exercise as tolerated daily. Maintain weight.  Continue to monitor. Check CMP/GFR  Other abnormal glucose  (prediabetes) Prediabetic. Education: Reviewed 'ABCs' of diabetes management  Discussed goals to be met and/or maintained include A1C (<7) Blood pressure (<130/80) Cholesterol (LDL <70) Continue Eye Exam yearly  Continue Dental Exam Q6 mo Discussed dietary recommendations Discussed Physical Activity recommendations Check A1C  Vitamin D deficiency Continue supplement for goal of 60-100 Monitor Vitamin D levels  Recurrent major depressive disorder, in full remission (HCC) Controlled.   Continue Sertraline  Reviewed relaxation techniques.  Sleep hygiene. Discussed mindfulness meditation and exercise.   Encouraged personality growth wand development through coping techniques and problem-solving skills.  BMI 22.0-22.9, adult Discussed appropriate BMI Diet modification. Physical activity. Encouraged/praised to build confidence.  Medication management No medication changes today. All medications reviewed and discussed. All questions and concerns have been addressed.   Orders Placed This Encounter  Procedures   CBC with Differential/Platelet   COMPLETE METABOLIC PANEL WITH GFR   Lipid panel   Hemoglobin A1c   Notify office for further evaluation and treatment, questions or concerns if any reported s/s fail to improve.   The patient was advised to call back or seek an in-person evaluation if any symptoms worsen or if the condition fails to improve as anticipated.   Further disposition pending results of labs. Discussed med's effects and SE's.    I discussed the assessment and treatment plan with the patient. The patient was provided an opportunity to ask questions and all were answered. The patient agreed with the plan and demonstrated an understanding of the instructions.  Discussed med's effects and SE's. Screening labs and tests as requested with regular follow-up as recommended.  I provided 35 minutes of face-to-face time during this encounter including counseling,  chart review, and critical decision making was preformed.  Today's Plan of Care is based on a patient-centered health  care approach known as shared decision making - the decisions, tests and treatments allow for patient preferences and values to be balanced with clinical evidence.    Future Appointments  Date Time Provider Department Center  03/07/2023  2:00 PM Lucky Cowboy, MD [facility] None   Subjective:  Monique Barton is a 82 y.o. female who presents for a 6 month follow up.   Overall she reports feeling well today - she has no new concerns at this time.  She continues to follow with [facility], Dr. Thedore Mins.  She follows every six months.  She has hx of depression and remains in remission on zoloft 50 mg daily.  Feels mood is well controlled. Her daughter passed in 12/2020 from cancer.  BMI is Body mass index is 22.64 kg/m., she has been working on diet and exercise.  Wt Readings from Last 3 Encounters:  12/05/22 127 lb 12.8 oz (58 kg)  08/22/22 128 lb 3.2 oz (58.2 kg)  08/14/22 128 lb (58.1 kg)   She is now checking BP in the home and reports average around 130/80.  She does workout. She denies chest pain, shortness of breath, dizziness.   She is on cholesterol medication and denies myalgias. Her cholesterol is at goal. The cholesterol last visit was:   Lab Results  Component Value Date   CHOL 156 06/04/2022   HDL 61 06/04/2022   LDLCALC 75 06/04/2022   TRIG 114 06/04/2022   CHOLHDL 2.6 06/04/2022    She has been working on diet and exercise for glucose management (recent hx of prediabetes), and denies foot ulcerations, increased appetite, nausea, paresthesia of the feet, polydipsia, polyuria, visual disturbances, vomiting and weight loss.  Last A1C in the office was:  Lab Results  Component Value Date   HGBA1C 5.9 (H) 06/04/2022   She has CKD 3b associated with htn monitored at this office, on [facility] - following with Dr. Thedore Mins.  Last GFR:  Lab Results   Component Value Date   GFRNONAA 29 (L) 05/12/2020   GFRNONAA 32 (L) 01/28/2020   GFRNONAA 29 (L) 10/01/2019  She drinks ~2 bottles daily. Denies current or hx of NSAID use.   Patient is on Vitamin D supplement and near goal of 60-100:    Lab Results  Component Value Date   VD25OH 36 06/04/2022      Medication Review: Current Outpatient Medications on File Prior to Visit  Medication Sig Dispense Refill   amLODipine (NORVASC) 10 MG tablet Take 10 mg by mouth daily.     Ascorbic Acid (VITAMIN C) 500 MG CAPS Take 1 capsule by mouth. Not on a regular basis     aspirin 81 MG tablet Take 81 mg by mouth daily.     Cholecalciferol (VITAMIN D PO) Take 5,000 Units by mouth daily.     ezetimibe (ZETIA) 10 MG tablet Take 1 tablet Daily for Cholesterol 90 tablet 3   Magnesium 400 MG TABS Take 1 tablet by mouth daily.     Multiple Vitamin (MULTIVITAMIN) tablet Take 1 tablet by mouth daily.     OVER THE COUNTER MEDICATION Takes an OTC Iron tablet daily.     pravastatin (PRAVACHOL) 20 MG tablet Take 1 tablet at Bedtime for Cholesterol. 90 tablet 3   sertraline (ZOLOFT) 50 MG tablet TAKE 1 TABLET BY MOUTH DAILY FOR MOOD 90 tablet 3   zinc gluconate 50 MG tablet Take 50 mg by mouth daily.     diclofenac Sodium (VOLTAREN) 1 % GEL Apply  4 g topically 4 (four) times daily. (Patient not taking: Reported on 12/05/2022) 100 g 0   No current facility-administered medications on file prior to visit.    Allergies  Allergen Reactions   Prednisone Other (See Comments)    HIGH DOSE CAUSES INSOMNIA    Current Problems (verified) Patient Active Problem List   Diagnosis Date Noted   Aortic atherosclerosis (HCC) by CT scan 06/04/2019 06/04/2019   Osteopenia 06/16/2018   BMI 22.0-22.9, adult 06/05/2017   CKD (chronic kidney disease) stage 3, GFR 30-59 ml/min (HCC) 06/05/2017   Recurrent major depressive disorder, in full remission (HCC) 04/17/2016   Encounter for Medicare annual wellness exam 12/28/2014    Medication management 10/06/2013   Essential hypertension 11/20/2012   Hyperlipidemia 11/20/2012   Other abnormal glucose (prediabetes) 11/20/2012   Vitamin D deficiency 11/20/2012    Screening Tests Immunization History  Administered Date(s) Administered   Fluad Quad(high Dose 65+) 10/02/2018   Influenza Nasal 11/06/2011   Influenza, High Dose Seasonal PF 10/06/2013, 09/23/2014, 10/30/2017, 10/08/2019, 11/09/2022   Influenza-Unspecified 10/24/2012, 10/15/2016, 10/13/2021   PFIZER(Purple Top)SARS-COV-2 Vaccination 02/28/2019, 03/25/2019   Pneumococcal Conjugate-13 06/08/2014   Pneumococcal-Unspecified 02/07/2006   Tdap 04/04/2011   Zoster, Live 01/15/2009    Preventative care: Last colonoscopy: 1999 declines further work up Cologuard 03/10/2022 Negative  Mammogram 09/2018 declines further work up DEXA 09/2018 osteopenia - T -2.0  declines further work up PAP remote  Tdap 2013 Influenza 09/2021 Prenvar 13 2016 Pneumonia 2008 Zoster 2011, planning to get shingrix Covid 19 2/2, pfizer   Names of Other Physician/Practitioners you currently use: 1. [facility] here for primary care 2. Dr. Macie Burows, eye doctor, 2024 3. Not seeing one, dentist, last visit remote, needs to schedule.  Patient Care Team: Lucky Cowboy, MD as PCP - General (Internal Medicine)  SURGICAL HISTORY She  has a past surgical history that includes Eye surgery (Bilateral, 2012). FAMILY HISTORY Her family history includes Cancer in her brother, brother, and father; Diabetes in her brother, brother, and mother; Heart disease in her mother; Hyperlipidemia in her mother; Hypertension in her mother. SOCIAL HISTORY She  reports that she has never smoked. She has been exposed to tobacco smoke. She has never used smokeless tobacco. She reports that she does not drink alcohol and does not use drugs.   Review of Systems  Constitutional:  Negative for malaise/fatigue and  weight loss.  HENT:  Negative for hearing loss and tinnitus.   Eyes:  Negative for blurred vision and double vision.  Respiratory:  Negative for cough, sputum production, shortness of breath and wheezing.   Cardiovascular:  Negative for chest pain, palpitations, orthopnea, claudication, leg swelling and PND.  Gastrointestinal:  Negative for abdominal pain, blood in stool, constipation, diarrhea, heartburn, melena, nausea and vomiting.  Genitourinary: Negative.   Musculoskeletal:  Negative for falls, joint pain and myalgias.  Skin:  Negative for rash.  Neurological:  Negative for dizziness, tingling, sensory change, weakness and headaches.  Endo/Heme/Allergies:  Negative for polydipsia.  Psychiatric/Behavioral: Negative.  Negative for depression, memory loss, substance abuse and suicidal ideas. The patient is not nervous/anxious and does not have insomnia.   All other systems reviewed and are negative.    Objective:     Today's Vitals   12/05/22 1427  BP: 138/60  Pulse: 61  Temp: 97.9 F (36.6 C)  SpO2: 98%  Weight: 127 lb 12.8 oz (58 kg)  Height: 5\' 3"  (1.6 m)   Body mass index is 22.64  kg/m.  General appearance: alert, no distress, WD/WN, female HEENT: normocephalic, sclerae anicteric, TMs pearly, nares patent, no discharge or erythema, mask in place; oral exam deferred. Hearing normal.  Neck: supple, no lymphadenopathy, no thyromegaly, no masses Heart: RRR, normal S1, S2, no murmurs Lungs: CTA bilaterally, no wheezes, rhonchi, or rales Abdomen: +bs, soft, non tender, non distended, no masses, no hepatomegaly, no splenomegaly Musculoskeletal: nontender, no swelling, no obvious deformity Extremities: no edema, no cyanosis, no clubbing Pulses: 2+ symmetric, upper and lower extremities, normal cap refill Neurological: alert, oriented x 3, CN2-12 intact, strength normal upper extremities and lower extremities, sensation normal throughout, DTRs 2+ throughout, no cerebellar signs,  gait normal Psychiatric: normal affect, behavior normal, pleasant  Skin:  Right check with linear raised round crusted dry rash.  Surrounding skin WNL.   Adela Glimpse, NP   12/05/2022

## 2022-12-06 ENCOUNTER — Encounter: Payer: Self-pay | Admitting: Internal Medicine

## 2022-12-06 ENCOUNTER — Other Ambulatory Visit: Payer: Self-pay | Admitting: Nurse Practitioner

## 2022-12-06 DIAGNOSIS — D649 Anemia, unspecified: Secondary | ICD-10-CM

## 2022-12-07 LAB — CBC WITH DIFFERENTIAL/PLATELET
Absolute Lymphocytes: 1410 {cells}/uL (ref 850–3900)
Absolute Monocytes: 490 {cells}/uL (ref 200–950)
Basophils Absolute: 30 {cells}/uL (ref 0–200)
Basophils Relative: 0.5 %
Eosinophils Absolute: 313 {cells}/uL (ref 15–500)
Eosinophils Relative: 5.3 %
HCT: 35.5 % (ref 35.0–45.0)
Hemoglobin: 11.2 g/dL — ABNORMAL LOW (ref 11.7–15.5)
MCH: 29.8 pg (ref 27.0–33.0)
MCHC: 31.5 g/dL — ABNORMAL LOW (ref 32.0–36.0)
MCV: 94.4 fL (ref 80.0–100.0)
MPV: 11.3 fL (ref 7.5–12.5)
Monocytes Relative: 8.3 %
Neutro Abs: 3658 {cells}/uL (ref 1500–7800)
Neutrophils Relative %: 62 %
Platelets: 204 10*3/uL (ref 140–400)
RBC: 3.76 10*6/uL — ABNORMAL LOW (ref 3.80–5.10)
RDW: 12.3 % (ref 11.0–15.0)
Total Lymphocyte: 23.9 %
WBC: 5.9 10*3/uL (ref 3.8–10.8)

## 2022-12-07 LAB — IRON,TIBC AND FERRITIN PANEL
%SAT: 19 % (ref 16–45)
Ferritin: 115 ng/mL (ref 16–288)
Iron: 53 ug/dL (ref 45–160)
TIBC: 281 ug/dL (ref 250–450)

## 2022-12-07 LAB — TEST AUTHORIZATION

## 2022-12-07 LAB — COMPLETE METABOLIC PANEL WITH GFR
AG Ratio: 1.8 (calc) (ref 1.0–2.5)
ALT: 12 U/L (ref 6–29)
AST: 18 U/L (ref 10–35)
Albumin: 4.1 g/dL (ref 3.6–5.1)
Alkaline phosphatase (APISO): 71 U/L (ref 37–153)
BUN/Creatinine Ratio: 15 (calc) (ref 6–22)
BUN: 25 mg/dL (ref 7–25)
CO2: 29 mmol/L (ref 20–32)
Calcium: 9.3 mg/dL (ref 8.6–10.4)
Chloride: 104 mmol/L (ref 98–110)
Creat: 1.67 mg/dL — ABNORMAL HIGH (ref 0.60–0.95)
Globulin: 2.3 g/dL (ref 1.9–3.7)
Glucose, Bld: 109 mg/dL — ABNORMAL HIGH (ref 65–99)
Potassium: 4.5 mmol/L (ref 3.5–5.3)
Sodium: 139 mmol/L (ref 135–146)
Total Bilirubin: 0.3 mg/dL (ref 0.2–1.2)
Total Protein: 6.4 g/dL (ref 6.1–8.1)
eGFR: 30 mL/min/{1.73_m2} — ABNORMAL LOW (ref >=60)

## 2022-12-07 LAB — LIPID PANEL
Cholesterol: 152 mg/dL (ref <200)
HDL: 64 mg/dL (ref >=50)
LDL Cholesterol (Calc): 72 mg/dL
Non-HDL Cholesterol (Calc): 88 mg/dL (ref <130)
Total CHOL/HDL Ratio: 2.4 (calc) (ref <5.0)
Triglycerides: 75 mg/dL (ref <150)

## 2022-12-07 LAB — HEMOGLOBIN A1C
Hgb A1c MFr Bld: 5.8 %{Hb} — ABNORMAL HIGH (ref <5.7)
Mean Plasma Glucose: 120 mg/dL
eAG (mmol/L): 6.6 mmol/L

## 2022-12-17 ENCOUNTER — Other Ambulatory Visit: Payer: Self-pay

## 2022-12-17 MED ORDER — SERTRALINE HCL 50 MG PO TABS: ORAL_TABLET | ORAL | 3 refills | Status: DC

## 2023-02-06 ENCOUNTER — Other Ambulatory Visit: Payer: Self-pay | Admitting: Internal Medicine

## 2023-02-06 DIAGNOSIS — E782 Mixed hyperlipidemia: Secondary | ICD-10-CM

## 2023-02-25 ENCOUNTER — Encounter: Payer: Medicare Other | Admitting: Internal Medicine

## 2023-03-07 ENCOUNTER — Encounter: Payer: Medicare Other | Admitting: Internal Medicine

## 2023-03-18 ENCOUNTER — Other Ambulatory Visit: Payer: Self-pay

## 2023-03-18 DIAGNOSIS — E782 Mixed hyperlipidemia: Secondary | ICD-10-CM

## 2023-03-18 MED ORDER — EZETIMIBE 10 MG PO TABS: ORAL_TABLET | ORAL | 0 refills | Status: DC

## 2023-04-24 ENCOUNTER — Encounter: Payer: Self-pay | Admitting: Family

## 2023-04-24 ENCOUNTER — Ambulatory Visit (INDEPENDENT_AMBULATORY_CARE_PROVIDER_SITE_OTHER): Admitting: Family

## 2023-04-24 VITALS — BP 138/66 | HR 65 | Temp 97.9°F | Resp 16 | Ht 63.0 in | Wt 126.0 lb

## 2023-04-24 DIAGNOSIS — Z7689 Persons encountering health services in other specified circumstances: Secondary | ICD-10-CM | POA: Diagnosis not present

## 2023-04-24 DIAGNOSIS — E782 Mixed hyperlipidemia: Secondary | ICD-10-CM

## 2023-04-24 MED ORDER — EZETIMIBE 10 MG PO TABS
ORAL_TABLET | ORAL | 0 refills | Status: DC
Start: 2023-04-24 — End: 2023-06-25

## 2023-04-24 NOTE — Progress Notes (Signed)
 Subjective:    Monique Barton - 83 y.o. female MRN [id number]  Date of birth: 1940-02-12  HPI  Monique Barton is to establish care.   Current issues and/or concerns: - Doing well on Ezetimibe, no issues/concerns. - States trouble sleeping for years. States she has tried several medications with no improvement. States sleeping trouble may be related to her sister and daughter recently passed away. She denies thoughts of self-harm, suicidal ideations, homicidal ideations. She declines referral to therapist. She declines referral to sleep specialist.  - Established with Nephrology.  - No further issues/concerns for discussion today.   ROS per HPI    Health Maintenance:  Health Maintenance Due  Topic Date Due   Medicare Annual Wellness (AWV)  06/04/2023     Past Medical History: Patient Active Problem List   Diagnosis Date Noted   Aortic atherosclerosis (HCC) by CT scan 06/04/2019 06/04/2019   Osteopenia 06/16/2018   BMI 22.0-22.9, adult 06/05/2017   CKD (chronic kidney disease) stage 3, GFR 30-59 ml/min (HCC) 06/05/2017   Recurrent major depressive disorder, in full remission (HCC) 04/17/2016   Encounter for Medicare annual wellness exam 12/28/2014   Medication management 10/06/2013   Essential hypertension 11/20/2012   Hyperlipidemia 11/20/2012   Other abnormal glucose (prediabetes) 11/20/2012   Vitamin D deficiency 11/20/2012      Social History   reports that she has never smoked. She has been exposed to tobacco smoke. She has never used smokeless tobacco. She reports that she does not drink alcohol and does not use drugs.   Family History  family history includes Cancer in her brother, brother, and father; Diabetes in her brother, brother, and mother; Heart disease in her mother; Hyperlipidemia in her mother; Hypertension in her mother.   Medications: reviewed and updated   Objective:   Physical Exam BP 138/66   Pulse 65   Temp 97.9 F (36.6 C) (Oral)   Resp  16   Ht 5\' 3"  (1.6 m)   Wt 126 lb (57.2 kg)   SpO2 97%   BMI 22.32 kg/m   Physical Exam HENT:     Head: Normocephalic and atraumatic.     Nose: Nose normal.     Mouth/Throat:     Mouth: Mucous membranes are moist.     Pharynx: Oropharynx is clear.  Eyes:     Extraocular Movements: Extraocular movements intact.     Conjunctiva/sclera: Conjunctivae normal.     Pupils: Pupils are equal, round, and reactive to light.  Cardiovascular:     Rate and Rhythm: Normal rate and regular rhythm.     Pulses: Normal pulses.     Heart sounds: Normal heart sounds.  Pulmonary:     Effort: Pulmonary effort is normal.     Breath sounds: Normal breath sounds.  Musculoskeletal:        General: Normal range of motion.     Cervical back: Normal range of motion and neck supple.  Neurological:     General: No focal deficit present.     Mental Status: She is alert and oriented to person, place, and time.  Psychiatric:        Mood and Affect: Mood normal.        Behavior: Behavior normal.       Assessment & Plan:  1. Encounter to establish care (Primary) - Patient presents today to establish care. During the interim follow-up with primary provider as scheduled.  - Return for annual physical examination, labs, and health maintenance.  Arrive fasting meaning having no food for at least 8 hours prior to appointment. You may have only water or black coffee. Please take scheduled medications as normal.  2. Hyperlipidemia - Continue Ezetimibe as prescribed. Counseled on medication adherence/adverse effects.  - Follow-up with primary provider as scheduled. - ezetimibe (ZETIA) 10 MG tablet; Take 1 tablet Daily for Cholesterol  Dispense: 90 tablet; Refill: 0    Patient was given clear instructions to go to Emergency Department or return to medical center if symptoms don't improve, worsen, or new problems develop.The patient verbalized understanding.  I discussed the assessment and treatment plan with the  patient. The patient was provided an opportunity to ask questions and all were answered. The patient agreed with the plan and demonstrated an understanding of the instructions.   The patient was advised to call back or seek an in-person evaluation if the symptoms worsen or if the condition fails to improve as anticipated.    Ricky Stabs, NP 04/24/2023, 2:57 PM Primary Care at [facility]

## 2023-04-24 NOTE — Progress Notes (Signed)
 Patient MD passed away so she is establishing care here.  Patient is having trouble sleeping at night. Patient goes to the nephrologist.  Patient has little energy.

## 2023-05-28 ENCOUNTER — Encounter: Payer: Self-pay | Admitting: Family

## 2023-05-28 ENCOUNTER — Ambulatory Visit (INDEPENDENT_AMBULATORY_CARE_PROVIDER_SITE_OTHER): Admitting: Family

## 2023-05-28 VITALS — BP 152/72 | HR 64 | Temp 98.1°F | Resp 16 | Ht 63.0 in | Wt 125.2 lb

## 2023-05-28 DIAGNOSIS — E782 Mixed hyperlipidemia: Secondary | ICD-10-CM

## 2023-05-28 DIAGNOSIS — Z131 Encounter for screening for diabetes mellitus: Secondary | ICD-10-CM

## 2023-05-28 DIAGNOSIS — Z124 Encounter for screening for malignant neoplasm of cervix: Secondary | ICD-10-CM

## 2023-05-28 DIAGNOSIS — I10 Essential (primary) hypertension: Secondary | ICD-10-CM

## 2023-05-28 DIAGNOSIS — Z13228 Encounter for screening for other metabolic disorders: Secondary | ICD-10-CM | POA: Diagnosis not present

## 2023-05-28 DIAGNOSIS — Z0000 Encounter for general adult medical examination without abnormal findings: Secondary | ICD-10-CM | POA: Diagnosis not present

## 2023-05-28 DIAGNOSIS — Z130 Encounter for screening for diseases of the blood and blood-forming organs and certain disorders involving the immune mechanism: Secondary | ICD-10-CM | POA: Diagnosis not present

## 2023-05-28 DIAGNOSIS — Z1329 Encounter for screening for other suspected endocrine disorder: Secondary | ICD-10-CM

## 2023-05-28 DIAGNOSIS — Z1231 Encounter for screening mammogram for malignant neoplasm of breast: Secondary | ICD-10-CM

## 2023-05-28 NOTE — Progress Notes (Signed)
 Patient ID: Monique Barton, female    DOB: 25-Apr-1940  MRN: [id number]  CC: Annual Exam  Subjective: Monique Barton is a 83 y.o. female who presents for annual exam.   Her concerns today include:  - Doing well on Amlodipine, no issues/concerns. Reports home blood pressures are usually lower than today's reading. She does not complain of red flag symptoms such as but not limited to chest pain, shortness of breath, worst headache of life, nausea/vomiting.  - Insomnia. I discussed with patient conservative measures. Patient declined referral to specialist. - Cologuard negative on 02/28/2022. - Established with Nephrology.   Patient Active Problem List   Diagnosis Date Noted   Aortic atherosclerosis (HCC) by CT scan 06/04/2019 06/04/2019   Osteopenia 06/16/2018   BMI 22.0-22.9, adult 06/05/2017   CKD (chronic kidney disease) stage 3, GFR 30-59 ml/min (HCC) 06/05/2017   Recurrent major depressive disorder, in full remission (HCC) 04/17/2016   Encounter for Medicare annual wellness exam 12/28/2014   Medication management 10/06/2013   Essential hypertension 11/20/2012   Hyperlipidemia 11/20/2012   Other abnormal glucose (prediabetes) 11/20/2012   Vitamin D  deficiency 11/20/2012     Current Outpatient Medications on File Prior to Visit  Medication Sig Dispense Refill   amLODipine (NORVASC) 10 MG tablet Take 10 mg by mouth daily.     Ascorbic Acid (VITAMIN C) 500 MG CAPS Take 1 capsule by mouth. Not on a regular basis     aspirin 81 MG tablet Take 81 mg by mouth daily.     Cholecalciferol (VITAMIN D  PO) Take 5,000 Units by mouth daily.     ezetimibe  (ZETIA ) 10 MG tablet Take 1 tablet Daily for Cholesterol 90 tablet 0   Magnesium 400 MG TABS Take 1 tablet by mouth daily.     Multiple Vitamin (MULTIVITAMIN) tablet Take 1 tablet by mouth daily.     OVER THE COUNTER MEDICATION Takes an OTC Iron tablet daily.     pravastatin  (PRAVACHOL ) 20 MG tablet TAKE 1 TABLET BY MOUTH AT BEDTIME FOR  CHOLESTEROL 90 tablet 3   sertraline  (ZOLOFT ) 50 MG tablet TAKE 1 TABLET BY MOUTH DAILY FOR MOOD 90 tablet 3   diclofenac  Sodium (VOLTAREN ) 1 % GEL Apply 4 g topically 4 (four) times daily. (Patient not taking: Reported on 12/05/2022) 100 g 0   zinc gluconate 50 MG tablet Take 50 mg by mouth daily. (Patient not taking: Reported on 04/24/2023)     No current facility-administered medications on file prior to visit.    Allergies  Allergen Reactions   Prednisone  Other (See Comments)    HIGH DOSE CAUSES INSOMNIA    Social History   Socioeconomic History   Marital status: Widowed    Spouse name: Not on file   Number of children: Not on file   Years of education: Not on file   Highest education level: Not on file  Occupational History   Not on file  Tobacco Use   Smoking status: Never    Passive exposure: Yes   Smokeless tobacco: Never   Tobacco comments:    Second hand smoke exposure  Vaping Use   Vaping status: Never Used  Substance and Sexual Activity   Alcohol use: No   Drug use: No   Sexual activity: Not on file  Other Topics Concern   Not on file  Social History Narrative   Not on file   Social Drivers of Health   Financial Resource Strain: Low Risk  (04/24/2023)  Overall Financial Resource Strain (CARDIA)    Difficulty of Paying Living Expenses: Not very hard  Food Insecurity: No Food Insecurity (04/24/2023)   Hunger Vital Sign    Worried About Running Out of Food in the Last Year: Never true    Ran Out of Food in the Last Year: Never true  Transportation Needs: No Transportation Needs (04/24/2023)   PRAPARE - Administrator, Civil Service (Medical): No    Lack of Transportation (Non-Medical): No  Physical Activity: Sufficiently Active (04/24/2023)   Exercise Vital Sign    Days of Exercise per Week: 5 days    Minutes of Exercise per Session: 30 min  Stress: No Stress Concern Present (04/24/2023)   [employer] of Occupational Health - Occupational  Stress Questionnaire    Feeling of Stress : Not at all  Social Connections: Moderately Isolated (04/24/2023)   Social Connection and Isolation Panel [NHANES]    Frequency of Communication with Friends and Family: More than three times a week    Frequency of Social Gatherings with Friends and Family: More than three times a week    Attends Religious Services: More than 4 times per year    Active Member of [employer] or Organizations: No    Attends Banker Meetings: Never    Marital Status: Widowed  Intimate Partner Violence: Not At Risk (04/24/2023)   Humiliation, Afraid, Rape, and Kick questionnaire    Fear of Current or Ex-Partner: No    Emotionally Abused: No    Physically Abused: No    Sexually Abused: No    Family History  Problem Relation Age of Onset   Diabetes Mother    Heart disease Mother    Hyperlipidemia Mother    Hypertension Mother    Cancer Father        PROSTATE   Diabetes Brother    Diabetes Brother    Cancer Brother        LUNG   Cancer Brother        BONE    Past Surgical History:  Procedure Laterality Date   EYE SURGERY Bilateral 2012    ROS: Review of Systems Negative except as stated above  PHYSICAL EXAM: BP (!) 152/72   Pulse 64   Temp 98.1 F (36.7 C) (Oral)   Resp 16   Ht 5\' 3"  (1.6 m)   Wt 125 lb 3.2 oz (56.8 kg)   SpO2 98%   BMI 22.18 kg/m   Physical Exam HENT:     Head: Normocephalic and atraumatic.     Right Ear: Tympanic membrane, ear canal and external ear normal.     Left Ear: Tympanic membrane, ear canal and external ear normal.     Nose: Nose normal.     Mouth/Throat:     Mouth: Mucous membranes are moist.     Pharynx: Oropharynx is clear.  Eyes:     Extraocular Movements: Extraocular movements intact.     Conjunctiva/sclera: Conjunctivae normal.     Pupils: Pupils are equal, round, and reactive to light.  Neck:     Thyroid : No thyroid  mass, thyromegaly or thyroid  tenderness.  Cardiovascular:     Rate and  Rhythm: Normal rate and regular rhythm.     Pulses: Normal pulses.     Heart sounds: Normal heart sounds.  Pulmonary:     Effort: Pulmonary effort is normal.     Breath sounds: Normal breath sounds.  Chest:     Comments: Patient declined. Abdominal:  General: Bowel sounds are normal.     Palpations: Abdomen is soft.  Genitourinary:    Comments: Patient declined. Musculoskeletal:        General: Normal range of motion.     Right shoulder: Normal.     Left shoulder: Normal.     Right upper arm: Normal.     Left upper arm: Normal.     Right elbow: Normal.     Left elbow: Normal.     Right forearm: Normal.     Left forearm: Normal.     Right wrist: Normal.     Left wrist: Normal.     Right hand: Normal.     Left hand: Normal.     Cervical back: Normal, normal range of motion and neck supple.     Thoracic back: Normal.     Lumbar back: Normal.     Right hip: Normal.     Left hip: Normal.     Right upper leg: Normal.     Left upper leg: Normal.     Right knee: Normal.     Left knee: Normal.     Right lower leg: Normal.     Left lower leg: Normal.     Right ankle: Normal.     Left ankle: Normal.     Right foot: Normal.     Left foot: Normal.  Skin:    General: Skin is warm and dry.     Capillary Refill: Capillary refill takes less than 2 seconds.  Neurological:     General: No focal deficit present.     Mental Status: She is alert and oriented to person, place, and time.  Psychiatric:        Mood and Affect: Mood normal.        Behavior: Behavior normal.     ASSESSMENT AND PLAN: 1. Annual physical exam (Primary) - Counseled on 150 minutes of exercise per week as tolerated, healthy eating (including decreased daily intake of saturated fats, cholesterol, added sugars, sodium), STI prevention, and routine healthcare maintenance.  2. Screening for metabolic disorder - Routine screening.  - Hepatic Function Panel  3. Screening for deficiency anemia - Routine  screening.  - CBC  4. Diabetes mellitus screening - Routine screening.  - Hemoglobin A1c  5. Thyroid  disorder screen - Routine screening.  - TSH  6. Primary hypertension - Blood pressure not at goal during today's visit. Patient asymptomatic without chest pressure, chest pain, palpitations, shortness of breath, worst headache of life, and any additional red flag symptoms. - Continue Amlodipine as prescribed. No refills needed as of present. - Counseled on blood pressure goal of less than 140/90, low-sodium, DASH diet, medication compliance, 150 minutes of moderate intensity exercise per week as tolerated. Discussed medication compliance, adverse effects. - Follow-up with primary provider in 4 weeks or sooner if needed.   7. Hyperlipidemia - Continue Ezetimibe  as prescribed. Counseled on medication adherence/adverse effects. No refills needed as of present.  - Routine screening.  - Follow-up with primary provider as scheduled. - Lipid panel  8. Encounter for screening mammogram for malignant neoplasm of breast - Routine screening.  - MM Digital Screening; Future  9. Cervical cancer screening - Referral to Gynecology for evaluation/management. - Ambulatory referral to Gynecology   Patient was given the opportunity to ask questions.  Patient verbalized understanding of the plan and was able to repeat key elements of the plan. Patient was given clear instructions to go to Emergency Department or return  to medical center if symptoms don't improve, worsen, or new problems develop.The patient verbalized understanding.   Orders Placed This Encounter  Procedures   MM Digital Screening   Hepatic Function Panel   CBC   Hemoglobin A1c   Lipid panel   TSH   Ambulatory referral to Gynecology   Return in about 1 year (around 05/27/2024) for Physical per patient preference and 4 weeks blood pressure check.  Senaida Dama, NP

## 2023-05-28 NOTE — Progress Notes (Signed)
 No concerns.

## 2023-05-29 ENCOUNTER — Ambulatory Visit: Payer: Self-pay | Admitting: Family

## 2023-05-29 LAB — CBC
Hematocrit: 38.8 % (ref 34.0–46.6)
Hemoglobin: 12.3 g/dL (ref 11.1–15.9)
MCH: 29.3 pg (ref 26.6–33.0)
MCHC: 31.7 g/dL (ref 31.5–35.7)
MCV: 92 fL (ref 79–97)
Platelets: 223 10*3/uL (ref 150–450)
RBC: 4.2 x10E6/uL (ref 3.77–5.28)
RDW: 13 % (ref 11.7–15.4)
WBC: 5.4 10*3/uL (ref 3.4–10.8)

## 2023-05-29 LAB — LIPID PANEL
Chol/HDL Ratio: 2.3 ratio (ref 0.0–4.4)
Cholesterol, Total: 153 mg/dL (ref 100–199)
HDL: 67 mg/dL (ref >39)
LDL Chol Calc (NIH): 68 mg/dL (ref 0–99)
Triglycerides: 96 mg/dL (ref 0–149)
VLDL Cholesterol Cal: 18 mg/dL (ref 5–40)

## 2023-05-29 LAB — HEPATIC FUNCTION PANEL
ALT: 15 IU/L (ref 0–32)
AST: 23 IU/L (ref 0–40)
Albumin: 4.2 g/dL (ref 3.7–4.7)
Alkaline Phosphatase: 95 IU/L (ref 44–121)
Bilirubin Total: 0.2 mg/dL (ref 0.0–1.2)
Bilirubin, Direct: 0.1 mg/dL (ref 0.00–0.40)
Total Protein: 6.5 g/dL (ref 6.0–8.5)

## 2023-05-29 LAB — TSH: TSH: 1.85 u[IU]/mL (ref 0.450–4.500)

## 2023-05-29 LAB — HEMOGLOBIN A1C
Est. average glucose Bld gHb Est-mCnc: 120 mg/dL
Hgb A1c MFr Bld: 5.8 % — ABNORMAL HIGH (ref 4.8–5.6)

## 2023-06-05 ENCOUNTER — Ambulatory Visit: Payer: Medicare Other | Admitting: Nurse Practitioner

## 2023-06-05 ENCOUNTER — Other Ambulatory Visit: Payer: Self-pay | Admitting: Family

## 2023-06-05 ENCOUNTER — Telehealth: Payer: Self-pay | Admitting: Family

## 2023-06-05 DIAGNOSIS — I10 Essential (primary) hypertension: Secondary | ICD-10-CM

## 2023-06-05 MED ORDER — AMLODIPINE BESYLATE 10 MG PO TABS: 10.0000 mg | ORAL_TABLET | Freq: Every day | ORAL | 0 refills | Status: DC

## 2023-06-05 NOTE — Telephone Encounter (Signed)
 Complete

## 2023-06-05 NOTE — Telephone Encounter (Signed)
Called patient and she is aware

## 2023-06-05 NOTE — Telephone Encounter (Signed)
 Patient needs refill for Amlodipine Patient was last seen and established care with PCP her at [facility] on 04/24/2023 and had a physical on 05/28/2023. Patient thought she could make it to next appointment in June but took her last pill today.

## 2023-06-25 ENCOUNTER — Ambulatory Visit (INDEPENDENT_AMBULATORY_CARE_PROVIDER_SITE_OTHER): Admitting: Family

## 2023-06-25 VITALS — BP 136/66 | HR 69 | Temp 98.7°F | Resp 16 | Wt 124.8 lb

## 2023-06-25 DIAGNOSIS — E782 Mixed hyperlipidemia: Secondary | ICD-10-CM

## 2023-06-25 DIAGNOSIS — I10 Essential (primary) hypertension: Secondary | ICD-10-CM | POA: Diagnosis not present

## 2023-06-25 MED ORDER — EZETIMIBE 10 MG PO TABS: ORAL_TABLET | ORAL | 0 refills | Status: DC

## 2023-06-25 MED ORDER — AMLODIPINE BESYLATE 10 MG PO TABS: 10.0000 mg | ORAL_TABLET | Freq: Every day | ORAL | 0 refills | Status: DC

## 2023-06-25 MED ORDER — PRAVASTATIN SODIUM 20 MG PO TABS: ORAL_TABLET | ORAL | 0 refills | Status: DC

## 2023-06-25 NOTE — Progress Notes (Signed)
 Patient ID: Monique Barton, female    DOB: [birth year]  MRN: [id number]  CC: Chronic Conditions Follow-Up  Subjective: Monique Barton is a 83 y.o. female who presents for chronic conditions follow-up.   Her concerns today include:  - Doing well on Amlodipine , no issues/concerns. She does not complain of red flag symptoms such as but not limited to chest pain, shortness of breath, worst headache of life, nausea/vomiting.  - Doing well on Pravastatin  and Ezetimibe , no issues/concerns.  Patient Active Problem List   Diagnosis Date Noted   Aortic atherosclerosis (HCC) by CT scan 06/04/2019 06/04/2019   Osteopenia 06/16/2018   BMI 22.0-22.9, adult 06/05/2017   CKD (chronic kidney disease) stage 3, GFR 30-59 ml/min (HCC) 06/05/2017   Recurrent major depressive disorder, in full remission (HCC) 04/17/2016   Encounter for Medicare annual wellness exam 12/28/2014   Medication management 10/06/2013   Essential hypertension 11/20/2012   Hyperlipidemia 11/20/2012   Other abnormal glucose (prediabetes) 11/20/2012   Vitamin D  deficiency 11/20/2012     Current Outpatient Medications on File Prior to Visit  Medication Sig Dispense Refill   Ascorbic Acid (VITAMIN C) 500 MG CAPS Take 1 capsule by mouth. Not on a regular basis     aspirin 81 MG tablet Take 81 mg by mouth daily.     Cholecalciferol (VITAMIN D  PO) Take 5,000 Units by mouth daily.     diclofenac  Sodium (VOLTAREN ) 1 % GEL Apply 4 g topically 4 (four) times daily. 100 g 0   Magnesium 400 MG TABS Take 1 tablet by mouth daily.     Multiple Vitamin (MULTIVITAMIN) tablet Take 1 tablet by mouth daily.     OVER THE COUNTER MEDICATION Takes an OTC Iron tablet daily.     sertraline  (ZOLOFT ) 50 MG tablet TAKE 1 TABLET BY MOUTH DAILY FOR MOOD 90 tablet 3   zinc gluconate 50 MG tablet Take 50 mg by mouth daily.     No current facility-administered medications on file prior to visit.    Allergies  Allergen Reactions   Prednisone  Other (See  Comments)    HIGH DOSE CAUSES INSOMNIA    Social History   Socioeconomic History   Marital status: Widowed    Spouse name: Not on file   Number of children: Not on file   Years of education: Not on file   Highest education level: Not on file  Occupational History   Not on file  Tobacco Use   Smoking status: Never    Passive exposure: Yes   Smokeless tobacco: Never   Tobacco comments:    Second hand smoke exposure  Vaping Use   Vaping status: Never Used  Substance and Sexual Activity   Alcohol use: No   Drug use: No   Sexual activity: Not on file  Other Topics Concern   Not on file  Social History Narrative   Not on file   Social Drivers of Health   Financial Resource Strain: Low Risk  (04/24/2023)   Overall Financial Resource Strain (CARDIA)    Difficulty of Paying Living Expenses: Not very hard  Food Insecurity: No Food Insecurity (04/24/2023)   Hunger Vital Sign    Worried About Running Out of Food in the Last Year: Never true    Ran Out of Food in the Last Year: Never true  Transportation Needs: No Transportation Needs (04/24/2023)   PRAPARE - Administrator, Civil Service (Medical): No    Lack of Transportation (Non-Medical): No  Physical Activity: Sufficiently Active (04/24/2023)   Exercise Vital Sign    Days of Exercise per Week: 5 days    Minutes of Exercise per Session: 30 min  Stress: No Stress Concern Present (04/24/2023)   [employer] of Occupational Health - Occupational Stress Questionnaire    Feeling of Stress : Not at all  Social Connections: Moderately Isolated (04/24/2023)   Social Connection and Isolation Panel [NHANES]    Frequency of Communication with Friends and Family: More than three times a week    Frequency of Social Gatherings with Friends and Family: More than three times a week    Attends Religious Services: More than 4 times per year    Active Member of [employer] or Organizations: No    Attends Banker Meetings:  Never    Marital Status: Widowed  Intimate Partner Violence: Not At Risk (04/24/2023)   Humiliation, Afraid, Rape, and Kick questionnaire    Fear of Current or Ex-Partner: No    Emotionally Abused: No    Physically Abused: No    Sexually Abused: No    Family History  Problem Relation Age of Onset   Diabetes Mother    Heart disease Mother    Hyperlipidemia Mother    Hypertension Mother    Cancer Father        PROSTATE   Diabetes Brother    Diabetes Brother    Cancer Brother        LUNG   Cancer Brother        BONE    Past Surgical History:  Procedure Laterality Date   EYE SURGERY Bilateral 2012    ROS: Review of Systems Negative except as stated above  PHYSICAL EXAM: BP 136/66   Pulse 69   Temp 98.7 F (37.1 C) (Oral)   Resp 16   Wt 124 lb 12.8 oz (56.6 kg)   SpO2 94%   BMI 22.11 kg/m   Physical Exam HENT:     Head: Normocephalic and atraumatic.     Nose: Nose normal.     Mouth/Throat:     Mouth: Mucous membranes are moist.     Pharynx: Oropharynx is clear.  Eyes:     Extraocular Movements: Extraocular movements intact.     Conjunctiva/sclera: Conjunctivae normal.     Pupils: Pupils are equal, round, and reactive to light.  Cardiovascular:     Rate and Rhythm: Normal rate and regular rhythm.     Pulses: Normal pulses.     Heart sounds: Normal heart sounds.  Pulmonary:     Effort: Pulmonary effort is normal.     Breath sounds: Normal breath sounds.  Musculoskeletal:        General: Normal range of motion.     Cervical back: Normal range of motion and neck supple.  Neurological:     General: No focal deficit present.     Mental Status: She is alert and oriented to person, place, and time.  Psychiatric:        Mood and Affect: Mood normal.        Behavior: Behavior normal.    ASSESSMENT AND PLAN: 1. Primary hypertension (Primary) - Continue Amlodipine  as prescribed.  - Counseled on blood pressure goal of less than 130/80, low-sodium, DASH diet,  medication compliance, and 150 minutes of moderate intensity exercise per week as tolerated. Counseled on medication adherence and adverse effects. - Follow-up with primary provider in 3 months or sooner if needed. - amLODipine  (NORVASC ) 10 MG tablet;  Take 1 tablet (10 mg total) by mouth daily.  Dispense: 90 tablet; Refill: 0  2. Hyperlipidemia - Continue Pravastatin  and Ezetimibe  as prescribed. Counseled on medication adherence/adverse effects.  - Follow-up with primary provider in 3 months or sooner if needed. - pravastatin  (PRAVACHOL ) 20 MG tablet; TAKE 1 TABLET BY MOUTH AT BEDTIME FOR CHOLESTEROL  Dispense: 90 tablet; Refill: 0 - ezetimibe  (ZETIA ) 10 MG tablet; Take 1 tablet Daily for Cholesterol  Dispense: 90 tablet; Refill: 0   Patient was given the opportunity to ask questions.  Patient verbalized understanding of the plan and was able to repeat key elements of the plan. Patient was given clear instructions to go to Emergency Department or return to medical center if symptoms don't improve, worsen, or new problems develop.The patient verbalized understanding.    Requested Prescriptions   Signed Prescriptions Disp Refills   amLODipine  (NORVASC ) 10 MG tablet 90 tablet 0    Sig: Take 1 tablet (10 mg total) by mouth daily.   pravastatin  (PRAVACHOL ) 20 MG tablet 90 tablet 0    Sig: TAKE 1 TABLET BY MOUTH AT BEDTIME FOR CHOLESTEROL   ezetimibe  (ZETIA ) 10 MG tablet 90 tablet 0    Sig: Take 1 tablet Daily for Cholesterol    Return in about 3 months (around 09/25/2023) for Follow-Up or next available chronic conditions.  Senaida Dama, NP

## 2023-06-25 NOTE — Progress Notes (Signed)
 Patient is here for 4wk follow-up BP Patent has uncontrolled hypertension Provider is  aware of readings

## 2023-07-31 DIAGNOSIS — N184 Chronic kidney disease, stage 4 (severe): Secondary | ICD-10-CM | POA: Diagnosis not present

## 2023-08-01 DIAGNOSIS — L570 Actinic keratosis: Secondary | ICD-10-CM | POA: Diagnosis not present

## 2023-08-05 DIAGNOSIS — D631 Anemia in chronic kidney disease: Secondary | ICD-10-CM | POA: Diagnosis not present

## 2023-08-05 DIAGNOSIS — E875 Hyperkalemia: Secondary | ICD-10-CM | POA: Diagnosis not present

## 2023-08-05 DIAGNOSIS — I129 Hypertensive chronic kidney disease with stage 1 through stage 4 chronic kidney disease, or unspecified chronic kidney disease: Secondary | ICD-10-CM | POA: Diagnosis not present

## 2023-08-05 DIAGNOSIS — N1832 Chronic kidney disease, stage 3b: Secondary | ICD-10-CM | POA: Diagnosis not present

## 2023-08-05 DIAGNOSIS — N2581 Secondary hyperparathyroidism of renal origin: Secondary | ICD-10-CM | POA: Diagnosis not present

## 2023-09-04 DIAGNOSIS — M7061 Trochanteric bursitis, right hip: Secondary | ICD-10-CM | POA: Diagnosis not present

## 2023-09-05 ENCOUNTER — Telehealth: Payer: Self-pay | Admitting: Family Medicine

## 2023-09-05 NOTE — Telephone Encounter (Signed)
 Called patient and left VM about referral.

## 2023-09-11 ENCOUNTER — Telehealth: Payer: Self-pay | Admitting: Family Medicine

## 2023-09-11 NOTE — Telephone Encounter (Signed)
 Second attempt to call patient in regards to referral. Left VM with call back number.

## 2023-09-25 ENCOUNTER — Encounter: Payer: Self-pay | Admitting: Family

## 2023-09-25 ENCOUNTER — Ambulatory Visit (INDEPENDENT_AMBULATORY_CARE_PROVIDER_SITE_OTHER): Admitting: Family

## 2023-09-25 VITALS — BP 155/75 | HR 69 | Temp 98.3°F | Resp 16 | Ht 63.0 in | Wt 121.8 lb

## 2023-09-25 DIAGNOSIS — F32A Depression, unspecified: Secondary | ICD-10-CM

## 2023-09-25 DIAGNOSIS — E782 Mixed hyperlipidemia: Secondary | ICD-10-CM

## 2023-09-25 DIAGNOSIS — F419 Anxiety disorder, unspecified: Secondary | ICD-10-CM

## 2023-09-25 DIAGNOSIS — I10 Essential (primary) hypertension: Secondary | ICD-10-CM | POA: Diagnosis not present

## 2023-09-25 MED ORDER — PRAVASTATIN SODIUM 20 MG PO TABS: ORAL_TABLET | ORAL | 0 refills | Status: DC

## 2023-09-25 MED ORDER — SERTRALINE HCL 100 MG PO TABS: 100.0000 mg | ORAL_TABLET | Freq: Every day | ORAL | 0 refills | Status: DC

## 2023-09-25 MED ORDER — EZETIMIBE 10 MG PO TABS: ORAL_TABLET | ORAL | 0 refills | Status: DC

## 2023-09-25 MED ORDER — AMLODIPINE BESYLATE 10 MG PO TABS: 10.0000 mg | ORAL_TABLET | Freq: Every day | ORAL | 0 refills | Status: DC

## 2023-09-25 NOTE — Progress Notes (Signed)
 Patient ID: Monique Barton, female    DOB: [birth year]  MRN: [id number]  CC: Chronic Conditions Follow-Up  Subjective: Monique Barton is a 83 y.o. female who presents for chronic conditions follow-up.  Her concerns today include:  - Doing well on Amlodipine , no issues/concerns. States she did not take her blood pressure medication this morning. Home blood pressures normal. She does not complain of red flag symptoms such as but not limited to chest pain, shortness of breath, worst headache of life, nausea/vomiting.  - Sertraline  not helping as much as she would like. States stress related to her daughter passed away 2 years ago and she is trying to settle her estate. States thinks this is also affecting her blood pressure. She denies thoughts of self-harm, suicidal ideations, homicidal ideations. - Established with Nephrology.   Patient Active Problem List   Diagnosis Date Noted   Aortic atherosclerosis (HCC) by CT scan 06/04/2019 06/04/2019   Osteopenia 06/16/2018   BMI 22.0-22.9, adult 06/05/2017   CKD (chronic kidney disease) stage 3, GFR 30-59 ml/min (HCC) 06/05/2017   Recurrent major depressive disorder, in full remission (HCC) 04/17/2016   Encounter for Medicare annual wellness exam 12/28/2014   Medication management 10/06/2013   Essential hypertension 11/20/2012   Hyperlipidemia 11/20/2012   Other abnormal glucose (prediabetes) 11/20/2012   Vitamin D  deficiency 11/20/2012     Current Outpatient Medications on File Prior to Visit  Medication Sig Dispense Refill   Ascorbic Acid (VITAMIN C) 500 MG CAPS Take 1 capsule by mouth. Not on a regular basis     aspirin 81 MG tablet Take 81 mg by mouth daily.     Cholecalciferol (VITAMIN D  PO) Take 5,000 Units by mouth daily.     diclofenac  Sodium (VOLTAREN ) 1 % GEL Apply 4 g topically 4 (four) times daily. 100 g 0   Magnesium 400 MG TABS Take 1 tablet by mouth daily.     Multiple Vitamin (MULTIVITAMIN) tablet Take 1 tablet by mouth  daily.     OVER THE COUNTER MEDICATION Takes an OTC Iron tablet daily.     zinc gluconate 50 MG tablet Take 50 mg by mouth daily.     No current facility-administered medications on file prior to visit.    Allergies  Allergen Reactions   Prednisone  Other (See Comments)    HIGH DOSE CAUSES INSOMNIA    Social History   Socioeconomic History   Marital status: Widowed    Spouse name: Not on file   Number of children: Not on file   Years of education: Not on file   Highest education level: Not on file  Occupational History   Not on file  Tobacco Use   Smoking status: Never    Passive exposure: Yes   Smokeless tobacco: Never   Tobacco comments:    Second hand smoke exposure  Vaping Use   Vaping status: Never Used  Substance and Sexual Activity   Alcohol use: No   Drug use: No   Sexual activity: Not on file  Other Topics Concern   Not on file  Social History Narrative   Not on file   Social Drivers of Health   Financial Resource Strain: Low Risk  (04/24/2023)   Overall Financial Resource Strain (CARDIA)    Difficulty of Paying Living Expenses: Not very hard  Food Insecurity: No Food Insecurity (04/24/2023)   Hunger Vital Sign    Worried About Running Out of Food in the Last Year: Never true  Ran Out of Food in the Last Year: Never true  Transportation Needs: No Transportation Needs (04/24/2023)   PRAPARE - Administrator, Civil Service (Medical): No    Lack of Transportation (Non-Medical): No  Physical Activity: Sufficiently Active (04/24/2023)   Exercise Vital Sign    Days of Exercise per Week: 5 days    Minutes of Exercise per Session: 30 min  Stress: No Stress Concern Present (04/24/2023)   [employer] of Occupational Health - Occupational Stress Questionnaire    Feeling of Stress : Not at all  Social Connections: Moderately Isolated (04/24/2023)   Social Connection and Isolation Panel    Frequency of Communication with Friends and Family: More than  three times a week    Frequency of Social Gatherings with Friends and Family: More than three times a week    Attends Religious Services: More than 4 times per year    Active Member of [employer] or Organizations: No    Attends Banker Meetings: Never    Marital Status: Widowed  Intimate Partner Violence: Not At Risk (04/24/2023)   Humiliation, Afraid, Rape, and Kick questionnaire    Fear of Current or Ex-Partner: No    Emotionally Abused: No    Physically Abused: No    Sexually Abused: No    Family History  Problem Relation Age of Onset   Diabetes Mother    Heart disease Mother    Hyperlipidemia Mother    Hypertension Mother    Cancer Father        PROSTATE   Diabetes Brother    Diabetes Brother    Cancer Brother        LUNG   Cancer Brother        BONE    Past Surgical History:  Procedure Laterality Date   EYE SURGERY Bilateral 2012    ROS: Review of Systems Negative except as stated above  PHYSICAL EXAM: BP (!) 155/75   Pulse 69   Temp 98.3 F (36.8 C) (Oral)   Resp 16   Ht 5' 3 (1.6 m)   Wt 121 lb 12.8 oz (55.2 kg)   SpO2 97%   BMI 21.58 kg/m   Physical Exam HENT:     Head: Normocephalic and atraumatic.     Nose: Nose normal.     Mouth/Throat:     Mouth: Mucous membranes are moist.     Pharynx: Oropharynx is clear.  Eyes:     Extraocular Movements: Extraocular movements intact.     Conjunctiva/sclera: Conjunctivae normal.     Pupils: Pupils are equal, round, and reactive to light.  Cardiovascular:     Rate and Rhythm: Normal rate and regular rhythm.     Pulses: Normal pulses.     Heart sounds: Normal heart sounds.  Pulmonary:     Effort: Pulmonary effort is normal.     Breath sounds: Normal breath sounds.  Musculoskeletal:        General: Normal range of motion.     Cervical back: Normal range of motion and neck supple.  Neurological:     General: No focal deficit present.     Mental Status: She is alert and oriented to person,  place, and time.  Psychiatric:        Mood and Affect: Mood normal.        Behavior: Behavior normal.     ASSESSMENT AND PLAN: 1. Primary hypertension (Primary) - Blood pressure not at goal during today's visit. Patient  asymptomatic without chest pressure, chest pain, palpitations, shortness of breath, worst headache of life, and any additional red flag symptoms. - Patient declined pharmacological adjustments.  - Continue Amlodipine  as prescribed.  - Counseled on blood pressure goal of less than 130/80, low-sodium, DASH diet, medication compliance, and 150 minutes of moderate intensity exercise per week as tolerated. Counseled on medication adherence and adverse effects. - Follow-up with primary provider in 2 weeks or sooner if needed.  - amLODipine  (NORVASC ) 10 MG tablet; Take 1 tablet (10 mg total) by mouth daily.  Dispense: 90 tablet; Refill: 0  2. Hyperlipidemia - Continue Pravastatin  and Ezetimibe  as prescribed. Counseled on medication adherence/adverse effects. - Follow-up with primary provider in 3 months or sooner if needed.  - pravastatin  (PRAVACHOL ) 20 MG tablet; TAKE 1 TABLET BY MOUTH AT BEDTIME FOR CHOLESTEROL  Dispense: 90 tablet; Refill: 0 - ezetimibe  (ZETIA ) 10 MG tablet; Take 1 tablet Daily for Cholesterol  Dispense: 90 tablet; Refill: 0  3. Anxiety and depression - Patient denies thoughts of self-harm, suicidal ideations, homicidal ideations. - Increase Sertraline  from 50 mg to 100 mg as prescribed. Counseled on medication adherence/adverse effects.  - Patient declined referral to Psychiatry.  - Follow-up with primary provider in 4 weeks or sooner if needed.  - sertraline  (ZOLOFT ) 100 MG tablet; Take 1 tablet (100 mg total) by mouth daily. TAKE 1 TABLET BY MOUTH DAILY FOR MOOD  Dispense: 90 tablet; Refill: 0  Patient was given the opportunity to ask questions.  Patient verbalized understanding of the plan and was able to repeat key elements of the plan. Patient was given  clear instructions to go to Emergency Department or return to medical center if symptoms don't improve, worsen, or new problems develop.The patient verbalized understanding.   Requested Prescriptions   Signed Prescriptions Disp Refills   amLODipine  (NORVASC ) 10 MG tablet 90 tablet 0    Sig: Take 1 tablet (10 mg total) by mouth daily.   pravastatin  (PRAVACHOL ) 20 MG tablet 90 tablet 0    Sig: TAKE 1 TABLET BY MOUTH AT BEDTIME FOR CHOLESTEROL   sertraline  (ZOLOFT ) 100 MG tablet 90 tablet 0    Sig: Take 1 tablet (100 mg total) by mouth daily. TAKE 1 TABLET BY MOUTH DAILY FOR MOOD   ezetimibe  (ZETIA ) 10 MG tablet 90 tablet 0    Sig: Take 1 tablet Daily for Cholesterol    Return in about 2 weeks (around 10/09/2023) for Follow-Up or next available chronic conditions.  Greig JINNY Drones, NP

## 2023-09-25 NOTE — Progress Notes (Signed)
 3 month follow up, medication refill

## 2023-10-03 ENCOUNTER — Telehealth: Payer: Self-pay | Admitting: Family Medicine

## 2023-10-03 NOTE — Telephone Encounter (Signed)
 Called patient in regards to a referral that we have received. Left VM with office number if patient still needs appointment.

## 2023-10-09 ENCOUNTER — Encounter: Payer: Self-pay | Admitting: Family

## 2023-10-09 ENCOUNTER — Ambulatory Visit (INDEPENDENT_AMBULATORY_CARE_PROVIDER_SITE_OTHER): Admitting: Family

## 2023-10-09 VITALS — BP 148/67 | HR 75 | Temp 98.0°F | Resp 16 | Ht 63.0 in | Wt 121.8 lb

## 2023-10-09 DIAGNOSIS — F419 Anxiety disorder, unspecified: Secondary | ICD-10-CM

## 2023-10-09 DIAGNOSIS — I10 Essential (primary) hypertension: Secondary | ICD-10-CM | POA: Diagnosis not present

## 2023-10-09 DIAGNOSIS — F32A Depression, unspecified: Secondary | ICD-10-CM | POA: Diagnosis not present

## 2023-10-09 DIAGNOSIS — M5431 Sciatica, right side: Secondary | ICD-10-CM | POA: Diagnosis not present

## 2023-10-09 NOTE — Progress Notes (Signed)
 Patient ID: Monique Barton, female    DOB: 1940-01-23  MRN: [id number]  CC: Chronic Conditions Follow-Up  Subjective: Monique Barton is a 83 y.o. female who presents for chronic conditions follow-up.   Her concerns today include:  - Doing well on Amlodipine , no issues/concerns. She does not complain of red flag symptoms such as but not limited to chest pain, shortness of breath, worst headache of life, nausea/vomiting.  - Doing well on Sertraline , no issues/concerns. She denies thoughts of self-harm, suicidal ideations, homicidal ideations.  Patient Active Problem List   Diagnosis Date Noted   Aortic atherosclerosis (HCC) by CT scan 06/04/2019 06/04/2019   Osteopenia 06/16/2018   BMI 22.0-22.9, adult 06/05/2017   CKD (chronic kidney disease) stage 3, GFR 30-59 ml/min (HCC) 06/05/2017   Recurrent major depressive disorder, in full remission 04/17/2016   Encounter for Medicare annual wellness exam 12/28/2014   Medication management 10/06/2013   Essential hypertension 11/20/2012   Hyperlipidemia 11/20/2012   Other abnormal glucose (prediabetes) 11/20/2012   Vitamin D  deficiency 11/20/2012     Current Outpatient Medications on File Prior to Visit  Medication Sig Dispense Refill   amLODipine  (NORVASC ) 10 MG tablet Take 1 tablet (10 mg total) by mouth daily. 90 tablet 0   Ascorbic Acid (VITAMIN C) 500 MG CAPS Take 1 capsule by mouth. Not on a regular basis     aspirin 81 MG tablet Take 81 mg by mouth daily.     Cholecalciferol (VITAMIN D  PO) Take 5,000 Units by mouth daily.     diclofenac  Sodium (VOLTAREN ) 1 % GEL Apply 4 g topically 4 (four) times daily. 100 g 0   ezetimibe  (ZETIA ) 10 MG tablet Take 1 tablet Daily for Cholesterol 90 tablet 0   Magnesium 400 MG TABS Take 1 tablet by mouth daily.     Multiple Vitamin (MULTIVITAMIN) tablet Take 1 tablet by mouth daily.     OVER THE COUNTER MEDICATION Takes an OTC Iron tablet daily.     pravastatin  (PRAVACHOL ) 20 MG tablet TAKE 1 TABLET  BY MOUTH AT BEDTIME FOR CHOLESTEROL 90 tablet 0   sertraline  (ZOLOFT ) 100 MG tablet Take 1 tablet (100 mg total) by mouth daily. TAKE 1 TABLET BY MOUTH DAILY FOR MOOD 90 tablet 0   zinc gluconate 50 MG tablet Take 50 mg by mouth daily.     No current facility-administered medications on file prior to visit.    Allergies  Allergen Reactions   Prednisone  Other (See Comments)    HIGH DOSE CAUSES INSOMNIA    Social History   Socioeconomic History   Marital status: Widowed    Spouse name: Not on file   Number of children: Not on file   Years of education: Not on file   Highest education level: Not on file  Occupational History   Not on file  Tobacco Use   Smoking status: Never    Passive exposure: Yes   Smokeless tobacco: Never   Tobacco comments:    Second hand smoke exposure  Vaping Use   Vaping status: Never Used  Substance and Sexual Activity   Alcohol use: No   Drug use: No   Sexual activity: Not on file  Other Topics Concern   Not on file  Social History Narrative   Not on file   Social Drivers of Health   Financial Resource Strain: Low Risk  (04/24/2023)   Overall Financial Resource Strain (CARDIA)    Difficulty of Paying Living Expenses: Not very  hard  Food Insecurity: No Food Insecurity (04/24/2023)   Hunger Vital Sign    Worried About Running Out of Food in the Last Year: Never true    Ran Out of Food in the Last Year: Never true  Transportation Needs: No Transportation Needs (04/24/2023)   PRAPARE - Administrator, Civil Service (Medical): No    Lack of Transportation (Non-Medical): No  Physical Activity: Sufficiently Active (04/24/2023)   Exercise Vital Sign    Days of Exercise per Week: 5 days    Minutes of Exercise per Session: 30 min  Stress: No Stress Concern Present (04/24/2023)   [employer] of Occupational Health - Occupational Stress Questionnaire    Feeling of Stress : Not at all  Social Connections: Moderately Isolated (04/24/2023)    Social Connection and Isolation Panel    Frequency of Communication with Friends and Family: More than three times a week    Frequency of Social Gatherings with Friends and Family: More than three times a week    Attends Religious Services: More than 4 times per year    Active Member of [employer] or Organizations: No    Attends Banker Meetings: Never    Marital Status: Widowed  Intimate Partner Violence: Not At Risk (04/24/2023)   Humiliation, Afraid, Rape, and Kick questionnaire    Fear of Current or Ex-Partner: No    Emotionally Abused: No    Physically Abused: No    Sexually Abused: No    Family History  Problem Relation Age of Onset   Diabetes Mother    Heart disease Mother    Hyperlipidemia Mother    Hypertension Mother    Cancer Father        PROSTATE   Diabetes Brother    Diabetes Brother    Cancer Brother        LUNG   Cancer Brother        BONE    Past Surgical History:  Procedure Laterality Date   EYE SURGERY Bilateral 2012    ROS: Review of Systems Negative except as stated above  PHYSICAL EXAM: BP (!) 148/67   Pulse 75   Temp 98 F (36.7 C) (Oral)   Resp 16   Ht 5' 3 (1.6 m)   Wt 121 lb 12.8 oz (55.2 kg)   SpO2 97%   BMI 21.58 kg/m   Physical Exam HENT:     Head: Normocephalic and atraumatic.     Nose: Nose normal.     Mouth/Throat:     Mouth: Mucous membranes are moist.     Pharynx: Oropharynx is clear.  Eyes:     Extraocular Movements: Extraocular movements intact.     Conjunctiva/sclera: Conjunctivae normal.     Pupils: Pupils are equal, round, and reactive to light.  Cardiovascular:     Rate and Rhythm: Normal rate and regular rhythm.     Pulses: Normal pulses.     Heart sounds: Normal heart sounds.  Pulmonary:     Effort: Pulmonary effort is normal.     Breath sounds: Normal breath sounds.  Musculoskeletal:        General: Normal range of motion.     Cervical back: Normal range of motion and neck supple.  Neurological:      General: No focal deficit present.     Mental Status: She is alert and oriented to person, place, and time.  Psychiatric:        Mood and Affect: Mood normal.  Behavior: Behavior normal.     ASSESSMENT AND PLAN: Note - I discussed plan of care with supervising physician Raguel Blush, MD  1. Primary hypertension (Primary) - Continue Amlodipine  as prescribed. No refills needed as of present. - Routine screening.  - Counseled on blood pressure goal of less than 140/90, low-sodium, DASH diet, medication compliance, 150 minutes of moderate intensity exercise per week as tolerated. Discussed medication compliance, adverse effects.  - Follow-up with primary provider in 3 months or sooner if needed. - Basic Metabolic Panel  2. Anxiety and depression - Patient denies thoughts of self-harm, suicidal ideations, homicidal ideations. - Continue Sertraline  as prescribed. Counseled on medication adherence/adverse effects. No refills needed as of present.  - Follow-up with primary provider as scheduled.   Patient was given the opportunity to ask questions.  Patient verbalized understanding of the plan and was able to repeat key elements of the plan. Patient was given clear instructions to go to Emergency Department or return to medical center if symptoms don't improve, worsen, or new problems develop.The patient verbalized understanding.   Orders Placed This Encounter  Procedures   Basic Metabolic Panel     Requested Prescriptions    No prescriptions requested or ordered in this encounter    Return in 3 months (on 01/08/2024) for Follow-Up or next available chronic conditions.  Greig JINNY Drones, NP

## 2023-10-09 NOTE — Progress Notes (Signed)
 Follow up on bp?

## 2023-10-10 ENCOUNTER — Other Ambulatory Visit: Payer: Self-pay | Admitting: Family

## 2023-10-10 ENCOUNTER — Ambulatory Visit: Payer: Self-pay | Admitting: Family

## 2023-10-10 DIAGNOSIS — L918 Other hypertrophic disorders of the skin: Secondary | ICD-10-CM

## 2023-10-10 LAB — BASIC METABOLIC PANEL WITH GFR
BUN/Creatinine Ratio: 19 (ref 12–28)
BUN: 30 mg/dL — ABNORMAL HIGH (ref 8–27)
CO2: 23 mmol/L (ref 20–29)
Calcium: 9.5 mg/dL (ref 8.7–10.3)
Chloride: 102 mmol/L (ref 96–106)
Creatinine, Ser: 1.55 mg/dL — ABNORMAL HIGH (ref 0.57–1.00)
Glucose: 129 mg/dL — ABNORMAL HIGH (ref 70–99)
Potassium: 4.6 mmol/L (ref 3.5–5.2)
Sodium: 141 mmol/L (ref 134–144)
eGFR: 33 mL/min/1.73 — ABNORMAL LOW (ref >59)

## 2023-10-10 NOTE — Progress Notes (Signed)
 I called patient with her lab results no one answered so I left a voicemail to return my call

## 2023-10-14 ENCOUNTER — Telehealth: Payer: Self-pay | Admitting: Emergency Medicine

## 2023-10-14 NOTE — Telephone Encounter (Signed)
 Copied from CRM [id number]. Topic: Clinical - Lab/Test Results >> Oct 10, 2023  3:16 PM Geneva B wrote: Reason for CRM: patient is calling in to get her blood work results please call pt back [phone] (M)  I returned patient call with results no one answered and I was unable to leave a voicemail

## 2023-10-16 ENCOUNTER — Other Ambulatory Visit: Payer: Self-pay | Admitting: Pharmacist

## 2023-10-16 NOTE — Progress Notes (Signed)
 Pharmacy Quality Measure Review  This patient is appearing on a report for adherence measure for cholesterol (statin) medications this calendar year.   Medication: pravastatin  Last fill date: 09/25/2023 for 90 day supply  Insurance report was not up to date. No action needed at this time.   Monique Barton, PharmD, JAQUELINE, CPP Clinical Pharmacist [facility] [phone]

## 2023-12-11 ENCOUNTER — Other Ambulatory Visit: Payer: Self-pay | Admitting: Family

## 2023-12-11 DIAGNOSIS — F32A Depression, unspecified: Secondary | ICD-10-CM

## 2023-12-16 NOTE — Telephone Encounter (Signed)
 Complete

## 2023-12-20 ENCOUNTER — Other Ambulatory Visit: Payer: Self-pay | Admitting: Family

## 2023-12-20 DIAGNOSIS — E782 Mixed hyperlipidemia: Secondary | ICD-10-CM

## 2023-12-25 ENCOUNTER — Encounter: Payer: Self-pay | Admitting: Family

## 2023-12-25 ENCOUNTER — Other Ambulatory Visit: Payer: Self-pay | Admitting: Pharmacist

## 2023-12-25 ENCOUNTER — Ambulatory Visit (INDEPENDENT_AMBULATORY_CARE_PROVIDER_SITE_OTHER): Admitting: Family

## 2023-12-25 VITALS — BP 159/75 | HR 80 | Temp 97.9°F | Resp 16 | Ht 63.0 in | Wt 119.8 lb

## 2023-12-25 DIAGNOSIS — I10 Essential (primary) hypertension: Secondary | ICD-10-CM

## 2023-12-25 DIAGNOSIS — K219 Gastro-esophageal reflux disease without esophagitis: Secondary | ICD-10-CM

## 2023-12-25 MED ORDER — OMEPRAZOLE 20 MG PO CPDR: 20.0000 mg | DELAYED_RELEASE_CAPSULE | Freq: Every day | ORAL | 0 refills | Status: AC

## 2023-12-25 MED ORDER — VALSARTAN 40 MG PO TABS: 40.0000 mg | ORAL_TABLET | Freq: Every day | ORAL | 0 refills | Status: DC

## 2023-12-25 MED ORDER — AMLODIPINE BESYLATE 10 MG PO TABS: 10.0000 mg | ORAL_TABLET | Freq: Every day | ORAL | 0 refills | Status: DC

## 2023-12-25 NOTE — Progress Notes (Signed)
 3 month follow-up

## 2023-12-25 NOTE — Progress Notes (Signed)
 Patient ID: Monique Barton, female    DOB: [birth year]  MRN: [id number]  CC: Chronic Conditions Follow-Up  Subjective: Monique Barton is a 83 y.o. female who presents for chronic conditions follow-up.   Her concerns today include:  - Doing well on Amlodipine , no issues/concerns. She does not check blood pressure outside of office. She does not complain of red flag symptoms such as but not limited to chest pain, shortness of breath, worst headache of life, nausea/vomiting.  - Acid reflux. Denies red flag symptoms.  - Established with Nephrology.   Patient Active Problem List   Diagnosis Date Noted   Aortic atherosclerosis (HCC) by CT scan 06/04/2019 06/04/2019   Osteopenia 06/16/2018   BMI 22.0-22.9, adult 06/05/2017   CKD (chronic kidney disease) stage 3, GFR 30-59 ml/min (HCC) 06/05/2017   Recurrent major depressive disorder, in full remission 04/17/2016   Encounter for Medicare annual wellness exam 12/28/2014   Medication management 10/06/2013   Essential hypertension 11/20/2012   Hyperlipidemia 11/20/2012   Other abnormal glucose (prediabetes) 11/20/2012   Vitamin D  deficiency 11/20/2012     Current Outpatient Medications on File Prior to Visit  Medication Sig Dispense Refill   Ascorbic Acid (VITAMIN C) 500 MG CAPS Take 1 capsule by mouth. Not on a regular basis     aspirin 81 MG tablet Take 81 mg by mouth daily.     Cholecalciferol (VITAMIN D  PO) Take 5,000 Units by mouth daily.     diclofenac  Sodium (VOLTAREN ) 1 % GEL Apply 4 g topically 4 (four) times daily. 100 g 0   ezetimibe  (ZETIA ) 10 MG tablet Take 1 tablet Daily for Cholesterol 90 tablet 0   Magnesium 400 MG TABS Take 1 tablet by mouth daily.     Multiple Vitamin (MULTIVITAMIN) tablet Take 1 tablet by mouth daily.     OVER THE COUNTER MEDICATION Takes an OTC Iron tablet daily.     pravastatin  (PRAVACHOL ) 20 MG tablet TAKE 1 TABLET BY MOUTH EVERY DAY AT BEDTIME FOR CHOLESTEROL 90 tablet 0   sertraline  (ZOLOFT ) 100 MG  tablet TAKE 1 TABLET BY MOUTH DAILY FOR MOOD 90 tablet 0   zinc gluconate 50 MG tablet Take 50 mg by mouth daily.     No current facility-administered medications on file prior to visit.    Allergies  Allergen Reactions   Prednisone  Other (See Comments)    HIGH DOSE CAUSES INSOMNIA    Social History   Socioeconomic History   Marital status: Widowed    Spouse name: Not on file   Number of children: Not on file   Years of education: Not on file   Highest education level: Not on file  Occupational History   Not on file  Tobacco Use   Smoking status: Never    Passive exposure: Yes   Smokeless tobacco: Never   Tobacco comments:    Second hand smoke exposure  Vaping Use   Vaping status: Never Used  Substance and Sexual Activity   Alcohol use: No   Drug use: No   Sexual activity: Not on file  Other Topics Concern   Not on file  Social History Narrative   Not on file   Social Drivers of Health   Financial Resource Strain: Low Risk  (04/24/2023)   Overall Financial Resource Strain (CARDIA)    Difficulty of Paying Living Expenses: Not very hard  Food Insecurity: No Food Insecurity (04/24/2023)   Hunger Vital Sign    Worried About Running Out  of Food in the Last Year: Never true    Ran Out of Food in the Last Year: Never true  Transportation Needs: No Transportation Needs (04/24/2023)   PRAPARE - Administrator, Civil Service (Medical): No    Lack of Transportation (Non-Medical): No  Physical Activity: Sufficiently Active (04/24/2023)   Exercise Vital Sign    Days of Exercise per Week: 5 days    Minutes of Exercise per Session: 30 min  Stress: No Stress Concern Present (04/24/2023)   [employer] of Occupational Health - Occupational Stress Questionnaire    Feeling of Stress : Not at all  Social Connections: Moderately Isolated (04/24/2023)   Social Connection and Isolation Panel    Frequency of Communication with Friends and Family: More than three times a week     Frequency of Social Gatherings with Friends and Family: More than three times a week    Attends Religious Services: More than 4 times per year    Active Member of [employer] or Organizations: No    Attends Banker Meetings: Never    Marital Status: Widowed  Intimate Partner Violence: Not At Risk (04/24/2023)   Humiliation, Afraid, Rape, and Kick questionnaire    Fear of Current or Ex-Partner: No    Emotionally Abused: No    Physically Abused: No    Sexually Abused: No    Family History  Problem Relation Age of Onset   Diabetes Mother    Heart disease Mother    Hyperlipidemia Mother    Hypertension Mother    Cancer Father        PROSTATE   Diabetes Brother    Diabetes Brother    Cancer Brother        LUNG   Cancer Brother        BONE    Past Surgical History:  Procedure Laterality Date   EYE SURGERY Bilateral 2012    ROS: Review of Systems Negative except as stated above  PHYSICAL EXAM: BP (!) 159/75   Pulse 80   Temp 97.9 F (36.6 C) (Oral)   Resp 16   Ht 5' 3 (1.6 m)   Wt 119 lb 12.8 oz (54.3 kg)   SpO2 98%   BMI 21.22 kg/m   Physical Exam HENT:     Head: Normocephalic and atraumatic.     Nose: Nose normal.     Mouth/Throat:     Mouth: Mucous membranes are moist.     Pharynx: Oropharynx is clear.  Eyes:     Extraocular Movements: Extraocular movements intact.     Conjunctiva/sclera: Conjunctivae normal.     Pupils: Pupils are equal, round, and reactive to light.  Cardiovascular:     Rate and Rhythm: Normal rate and regular rhythm.     Pulses: Normal pulses.     Heart sounds: Normal heart sounds.  Pulmonary:     Effort: Pulmonary effort is normal.     Breath sounds: Normal breath sounds.  Musculoskeletal:        General: Normal range of motion.     Cervical back: Normal range of motion and neck supple.  Neurological:     General: No focal deficit present.     Mental Status: She is alert and oriented to person, place, and time.   Psychiatric:        Mood and Affect: Mood normal.        Behavior: Behavior normal.    ASSESSMENT AND PLAN: 1. Primary hypertension (Primary) -  Blood pressure not at goal during today's visit. Patient asymptomatic without chest pressure, chest pain, palpitations, shortness of breath, worst headache of life, and any additional red flag symptoms. - Continue Amlodipine  as prescribed.  - Trial Valsartan as prescribed.  - Counseled on blood pressure goal of less than 140/90, low-sodium, DASH diet, medication compliance, 150 minutes of moderate intensity exercise per week as tolerated. Discussed medication compliance, adverse effects. - Follow-up with primary provider in 4 weeks or sooner if needed.  - valsartan (DIOVAN) 40 MG tablet; Take 1 tablet (40 mg total) by mouth daily.  Dispense: 90 tablet; Refill: 0 - amLODipine  (NORVASC ) 10 MG tablet; Take 1 tablet (10 mg total) by mouth daily.  Dispense: 90 tablet; Refill: 0  2. Gastroesophageal reflux disease, unspecified whether esophagitis present - Omeprazole as prescribed. Counseled on medication adherence/adverse effects.  - Follow-up with primary provider as scheduled.  - omeprazole (PRILOSEC) 20 MG capsule; Take 1 capsule (20 mg total) by mouth daily.  Dispense: 90 capsule; Refill: 0   Patient was given the opportunity to ask questions.  Patient verbalized understanding of the plan and was able to repeat key elements of the plan. Patient was given clear instructions to go to Emergency Department or return to medical center if symptoms don't improve, worsen, or new problems develop.The patient verbalized understanding.   No orders of the defined types were placed in this encounter.    Requested Prescriptions   Signed Prescriptions Disp Refills   valsartan (DIOVAN) 40 MG tablet 90 tablet 0    Sig: Take 1 tablet (40 mg total) by mouth daily.   amLODipine  (NORVASC ) 10 MG tablet 90 tablet 0    Sig: Take 1 tablet (10 mg total) by mouth  daily.   omeprazole (PRILOSEC) 20 MG capsule 90 capsule 0    Sig: Take 1 capsule (20 mg total) by mouth daily.    Return in about 4 weeks (around 01/22/2024) for Follow-Up or next available chronic conditions.  Greig JINNY Chute, NP

## 2023-12-25 NOTE — Progress Notes (Signed)
 Pharmacy Quality Measure Review  This patient is appearing on a report for adherence measure for cholesterol (statin) medications this calendar year.   Medication: pravastatin  Last fill date: 12/21/23 for 90 day supply  Insurance report was not up to date. No action needed at this time.   Herlene Fleeta Morris, PharmD, JAQUELINE, CPP Clinical Pharmacist [facility] [phone]

## 2024-01-11 ENCOUNTER — Other Ambulatory Visit: Payer: Self-pay | Admitting: Family

## 2024-01-11 DIAGNOSIS — E782 Mixed hyperlipidemia: Secondary | ICD-10-CM

## 2024-01-27 ENCOUNTER — Ambulatory Visit (INDEPENDENT_AMBULATORY_CARE_PROVIDER_SITE_OTHER): Payer: Medicare (Managed Care) | Admitting: Family

## 2024-01-27 ENCOUNTER — Encounter: Payer: Self-pay | Admitting: Family

## 2024-01-27 VITALS — BP 160/75 | HR 70 | Ht 63.0 in | Wt 121.0 lb

## 2024-01-27 DIAGNOSIS — I10 Essential (primary) hypertension: Secondary | ICD-10-CM | POA: Diagnosis not present

## 2024-01-27 DIAGNOSIS — F32A Depression, unspecified: Secondary | ICD-10-CM | POA: Diagnosis not present

## 2024-01-27 DIAGNOSIS — F419 Anxiety disorder, unspecified: Secondary | ICD-10-CM

## 2024-01-27 MED ORDER — AMLODIPINE BESYLATE 10 MG PO TABS: 10.0000 mg | ORAL_TABLET | Freq: Every day | ORAL | 0 refills | Status: AC

## 2024-01-27 MED ORDER — VALSARTAN 80 MG PO TABS: 80.0000 mg | ORAL_TABLET | Freq: Every day | ORAL | 0 refills | Status: AC

## 2024-01-27 MED ORDER — SERTRALINE HCL 100 MG PO TABS: 150.0000 mg | ORAL_TABLET | Freq: Every day | ORAL | 2 refills | Status: DC

## 2024-01-27 NOTE — Progress Notes (Signed)
 "   Patient ID: Monique Barton, female    DOB: 08/19/1940  MRN: [id number]  CC: Chronic Conditions Follow-Up  Subjective: Monique Barton is a 84 y.o. female who presents for chronic conditions follow-up.   Her concerns today include:  - Doing well on Amlodipine  and Valsartan , no issues/concerns. She does not consistently check her blood pressure at home. She does not limit salt intake. She does not complain of red flag symptoms such as but not limited to chest pain, shortness of breath, worst headache of life, nausea/vomiting.  - Anxiety depression persisting. States doesn't feel Sertraline  helping as much as she would like. She denies thoughts of self-harm, suicidal ideations, homicidal ideations. - Established with Nephrology.   Patient Active Problem List   Diagnosis Date Noted   Aortic atherosclerosis (HCC) by CT scan 06/04/2019 06/04/2019   Osteopenia 06/16/2018   BMI 22.0-22.9, adult 06/05/2017   CKD (chronic kidney disease) stage 3, GFR 30-59 ml/min (HCC) 06/05/2017   Recurrent major depressive disorder, in full remission 04/17/2016   Encounter for Medicare annual wellness exam 12/28/2014   Medication management 10/06/2013   Essential hypertension 11/20/2012   Hyperlipidemia 11/20/2012   Other abnormal glucose (prediabetes) 11/20/2012   Vitamin D  deficiency 11/20/2012     Medications Ordered Prior to Encounter[1]  Allergies[2]  Social History   Socioeconomic History   Marital status: Widowed    Spouse name: Not on file   Number of children: Not on file   Years of education: Not on file   Highest education level: Not on file  Occupational History   Not on file  Tobacco Use   Smoking status: Never    Passive exposure: Yes   Smokeless tobacco: Never   Tobacco comments:    Second hand smoke exposure  Vaping Use   Vaping status: Never Used  Substance and Sexual Activity   Alcohol use: No   Drug use: No   Sexual activity: Not on file  Other Topics Concern   Not on  file  Social History Narrative   Not on file   Social Drivers of Health   Tobacco Use: Medium Risk (01/27/2024)   Patient History    Smoking Tobacco Use: Never    Smokeless Tobacco Use: Never    Passive Exposure: Yes  Financial Resource Strain: Low Risk (04/24/2023)   Overall Financial Resource Strain (CARDIA)    Difficulty of Paying Living Expenses: Not very hard  Food Insecurity: No Food Insecurity (04/24/2023)   Hunger Vital Sign    Worried About Running Out of Food in the Last Year: Never true    Ran Out of Food in the Last Year: Never true  Transportation Needs: No Transportation Needs (04/24/2023)   PRAPARE - Administrator, Civil Service (Medical): No    Lack of Transportation (Non-Medical): No  Physical Activity: Sufficiently Active (04/24/2023)   Exercise Vital Sign    Days of Exercise per Week: 5 days    Minutes of Exercise per Session: 30 min  Stress: No Stress Concern Present (04/24/2023)   [employer] of Occupational Health - Occupational Stress Questionnaire    Feeling of Stress : Not at all  Social Connections: Moderately Isolated (04/24/2023)   Social Connection and Isolation Panel    Frequency of Communication with Friends and Family: More than three times a week    Frequency of Social Gatherings with Friends and Family: More than three times a week    Attends Religious Services: More than 4 times  per year    Active Member of Clubs or Organizations: No    Attends Banker Meetings: Never    Marital Status: Widowed  Intimate Partner Violence: Not At Risk (04/24/2023)   Humiliation, Afraid, Rape, and Kick questionnaire    Fear of Current or Ex-Partner: No    Emotionally Abused: No    Physically Abused: No    Sexually Abused: No  Depression (PHQ2-9): Low Risk (12/25/2023)   Depression (PHQ2-9)    PHQ-2 Score: 1  Alcohol Screen: Low Risk (04/24/2023)   Alcohol Screen    Last Alcohol Screening Score (AUDIT): 0  Housing: Unknown (04/24/2023)    Housing Stability Vital Sign    Unable to Pay for Housing in the Last Year: No    Number of Times Moved in the Last Year: Not on file    Homeless in the Last Year: No  Utilities: Not At Risk (04/24/2023)   AHC Utilities    Threatened with loss of utilities: No  Health Literacy: Adequate Health Literacy (04/24/2023)   B1300 Health Literacy    Frequency of need for help with medical instructions: Never    Family History  Problem Relation Age of Onset   Diabetes Mother    Heart disease Mother    Hyperlipidemia Mother    Hypertension Mother    Cancer Father        PROSTATE   Diabetes Brother    Diabetes Brother    Cancer Brother        LUNG   Cancer Brother        BONE    Past Surgical History:  Procedure Laterality Date   EYE SURGERY Bilateral 2012    ROS: Review of Systems Negative except as stated above  PHYSICAL EXAM: BP (!) 160/75   Pulse 70   Ht 5' 3 (1.6 m)   Wt 121 lb (54.9 kg)   SpO2 97%   BMI 21.43 kg/m   Physical Exam HENT:     Head: Normocephalic and atraumatic.     Nose: Nose normal.     Mouth/Throat:     Mouth: Mucous membranes are moist.     Pharynx: Oropharynx is clear.  Eyes:     Extraocular Movements: Extraocular movements intact.     Conjunctiva/sclera: Conjunctivae normal.     Pupils: Pupils are equal, round, and reactive to light.  Cardiovascular:     Rate and Rhythm: Normal rate and regular rhythm.     Pulses: Normal pulses.     Heart sounds: Normal heart sounds.  Pulmonary:     Effort: Pulmonary effort is normal.     Breath sounds: Normal breath sounds.  Musculoskeletal:        General: Normal range of motion.     Cervical back: Normal range of motion and neck supple.  Neurological:     General: No focal deficit present.     Mental Status: She is alert and oriented to person, place, and time.  Psychiatric:        Mood and Affect: Mood normal.        Behavior: Behavior normal.    ASSESSMENT AND PLAN: 1. Primary hypertension  (Primary) - Blood pressure not at goal during today's visit. Patient asymptomatic without chest pressure, chest pain, palpitations, shortness of breath, worst headache of life, and any additional red flag symptoms. - Continue Amlodipine  as prescribed.  - Increase Valsartan  from 40 mg to 80 mg as prescribed.  - Counseled on blood pressure goal  of less than 140/90, low-sodium, DASH diet, medication compliance, 150 minutes of moderate intensity exercise per week as tolerated. Discussed medication compliance, adverse effects. - Follow-up with primary provider in 4 weeks or sooner if needed.  - amLODipine  (NORVASC ) 10 MG tablet; Take 1 tablet (10 mg total) by mouth daily.  Dispense: 90 tablet; Refill: 0 - valsartan  (DIOVAN ) 80 MG tablet; Take 1 tablet (80 mg total) by mouth daily.  Dispense: 90 tablet; Refill: 0  2. Anxiety and depression - Patient denies thoughts of self-harm, suicidal ideations, homicidal ideations. - Increase Sertraline  from 100 mg to 150 mg as prescribed. Counseled on medication adherence/adverse effects.  - Patient declined referral to Psychiatry.  - Follow-up with primary provider in 4 weeks or sooner if needed. - sertraline  (ZOLOFT ) 100 MG tablet; Take 1.5 tablets (150 mg total) by mouth daily.  Dispense: 45 tablet; Refill: 2   Patient was given the opportunity to ask questions.  Patient verbalized understanding of the plan and was able to repeat key elements of the plan. Patient was given clear instructions to go to Emergency Department or return to medical center if symptoms don't improve, worsen, or new problems develop.The patient verbalized understanding.   Requested Prescriptions   Signed Prescriptions Disp Refills   sertraline  (ZOLOFT ) 100 MG tablet 45 tablet 2    Sig: Take 1.5 tablets (150 mg total) by mouth daily.   amLODipine  (NORVASC ) 10 MG tablet 90 tablet 0    Sig: Take 1 tablet (10 mg total) by mouth daily.   valsartan  (DIOVAN ) 80 MG tablet 90 tablet 0     Sig: Take 1 tablet (80 mg total) by mouth daily.    Return in about 4 weeks (around 02/24/2024) for Follow-Up or next available chronic conditions.  Greig JINNY Chute, NP      [1]  Current Outpatient Medications on File Prior to Visit  Medication Sig Dispense Refill   Ascorbic Acid (VITAMIN C) 500 MG CAPS Take 1 capsule by mouth. Not on a regular basis     aspirin 81 MG tablet Take 81 mg by mouth daily.     Cholecalciferol (VITAMIN D  PO) Take 5,000 Units by mouth daily.     ezetimibe  (ZETIA ) 10 MG tablet TAKE 1 TABLET BY MOUTH EVERY DAY FOR CHOLESTEROL 90 tablet 0   Magnesium 400 MG TABS Take 1 tablet by mouth daily.     Multiple Vitamin (MULTIVITAMIN) tablet Take 1 tablet by mouth daily.     omeprazole  (PRILOSEC) 20 MG capsule Take 1 capsule (20 mg total) by mouth daily. 90 capsule 0   OVER THE COUNTER MEDICATION Takes an OTC Iron tablet daily.     pravastatin  (PRAVACHOL ) 20 MG tablet TAKE 1 TABLET BY MOUTH EVERY DAY AT BEDTIME FOR CHOLESTEROL 90 tablet 0   sertraline  (ZOLOFT ) 100 MG tablet TAKE 1 TABLET BY MOUTH DAILY FOR MOOD 90 tablet 0   diclofenac  Sodium (VOLTAREN ) 1 % GEL Apply 4 g topically 4 (four) times daily. (Patient not taking: Reported on 01/27/2024) 100 g 0   zinc gluconate 50 MG tablet Take 50 mg by mouth daily. (Patient not taking: Reported on 01/27/2024)     No current facility-administered medications on file prior to visit.  [2]  Allergies Allergen Reactions   Prednisone  Other (See Comments)    HIGH DOSE CAUSES INSOMNIA   "

## 2024-02-19 ENCOUNTER — Other Ambulatory Visit: Payer: Self-pay | Admitting: Family

## 2024-02-19 DIAGNOSIS — F419 Anxiety disorder, unspecified: Secondary | ICD-10-CM

## 2024-02-19 NOTE — Telephone Encounter (Signed)
 Complete

## 2024-02-28 ENCOUNTER — Ambulatory Visit: Payer: Self-pay | Admitting: Family

## 2024-03-30 ENCOUNTER — Ambulatory Visit: Admitting: Family
